# Patient Record
Sex: Male | Born: 1965 | Race: Black or African American | Hispanic: No | Marital: Married | Smoking: Never smoker
Health system: Southern US, Community
[De-identification: ages and names within clinical notes are randomized; demographics above are authoritative.]

## PROBLEM LIST (undated history)

## (undated) DIAGNOSIS — Z87898 Personal history of other specified conditions: Secondary | ICD-10-CM

## (undated) DIAGNOSIS — Z87442 Personal history of urinary calculi: Secondary | ICD-10-CM

## (undated) DIAGNOSIS — R338 Other retention of urine: Secondary | ICD-10-CM

## (undated) DIAGNOSIS — R569 Unspecified convulsions: Secondary | ICD-10-CM

## (undated) DIAGNOSIS — G40409 Other generalized epilepsy and epileptic syndromes, not intractable, without status epilepticus: Secondary | ICD-10-CM

## (undated) DIAGNOSIS — G4733 Obstructive sleep apnea (adult) (pediatric): Secondary | ICD-10-CM

## (undated) DIAGNOSIS — N201 Calculus of ureter: Secondary | ICD-10-CM

## (undated) DIAGNOSIS — N400 Enlarged prostate without lower urinary tract symptoms: Secondary | ICD-10-CM

## (undated) DIAGNOSIS — R310 Gross hematuria: Secondary | ICD-10-CM

## (undated) DIAGNOSIS — G43909 Migraine, unspecified, not intractable, without status migrainosus: Secondary | ICD-10-CM

---

## 1998-12-11 ENCOUNTER — Emergency Department (HOSPITAL_COMMUNITY): Admission: EM | Admit: 1998-12-11 | Discharge: 1998-12-11 | Payer: Self-pay | Admitting: Emergency Medicine

## 1999-08-16 ENCOUNTER — Encounter: Payer: Self-pay | Admitting: Emergency Medicine

## 1999-08-17 ENCOUNTER — Observation Stay (HOSPITAL_COMMUNITY): Admission: EM | Admit: 1999-08-17 | Discharge: 1999-08-17 | Payer: Self-pay | Admitting: Emergency Medicine

## 1999-08-17 ENCOUNTER — Encounter: Payer: Self-pay | Admitting: Emergency Medicine

## 2004-03-21 ENCOUNTER — Encounter: Admission: RE | Admit: 2004-03-21 | Discharge: 2004-03-21 | Payer: Self-pay | Admitting: Specialist

## 2004-03-24 ENCOUNTER — Encounter (INDEPENDENT_AMBULATORY_CARE_PROVIDER_SITE_OTHER): Payer: Self-pay | Admitting: *Deleted

## 2004-03-24 ENCOUNTER — Ambulatory Visit (HOSPITAL_COMMUNITY): Admission: RE | Admit: 2004-03-24 | Discharge: 2004-03-24 | Payer: Self-pay | Admitting: General Surgery

## 2004-03-24 ENCOUNTER — Ambulatory Visit (HOSPITAL_BASED_OUTPATIENT_CLINIC_OR_DEPARTMENT_OTHER): Admission: RE | Admit: 2004-03-24 | Discharge: 2004-03-24 | Payer: Self-pay | Admitting: General Surgery

## 2004-09-01 ENCOUNTER — Encounter
Admission: RE | Admit: 2004-09-01 | Discharge: 2004-09-01 | Payer: Self-pay | Admitting: Physical Medicine and Rehabilitation

## 2005-03-24 HISTORY — PX: OTHER SURGICAL HISTORY: SHX169

## 2006-08-08 ENCOUNTER — Encounter: Admission: RE | Admit: 2006-08-08 | Discharge: 2006-10-01 | Payer: Self-pay | Admitting: Family Medicine

## 2007-09-11 HISTORY — PX: SHOULDER SURGERY: SHX246

## 2009-08-05 ENCOUNTER — Emergency Department (HOSPITAL_COMMUNITY): Admission: EM | Admit: 2009-08-05 | Discharge: 2009-08-05 | Payer: Self-pay | Admitting: Emergency Medicine

## 2009-09-14 ENCOUNTER — Ambulatory Visit (HOSPITAL_BASED_OUTPATIENT_CLINIC_OR_DEPARTMENT_OTHER): Admission: RE | Admit: 2009-09-14 | Discharge: 2009-09-14 | Payer: Self-pay | Admitting: Neurology

## 2009-10-01 ENCOUNTER — Ambulatory Visit: Payer: Self-pay | Admitting: Internal Medicine

## 2009-11-01 ENCOUNTER — Encounter: Admission: RE | Admit: 2009-11-01 | Discharge: 2009-12-27 | Payer: Self-pay | Admitting: Family Medicine

## 2010-04-04 ENCOUNTER — Emergency Department (HOSPITAL_COMMUNITY): Admission: EM | Admit: 2010-04-04 | Discharge: 2010-04-04 | Payer: Self-pay | Admitting: Emergency Medicine

## 2010-11-08 ENCOUNTER — Emergency Department (HOSPITAL_COMMUNITY): Payer: Self-pay

## 2010-11-08 ENCOUNTER — Emergency Department (HOSPITAL_COMMUNITY)
Admission: EM | Admit: 2010-11-08 | Discharge: 2010-11-08 | Disposition: A | Discharge status: Home / Self Care | Payer: Self-pay | Attending: Emergency Medicine | Admitting: Emergency Medicine

## 2010-11-08 DIAGNOSIS — R5381 Other malaise: Secondary | ICD-10-CM | POA: Insufficient documentation

## 2010-11-08 DIAGNOSIS — R569 Unspecified convulsions: Secondary | ICD-10-CM | POA: Insufficient documentation

## 2010-11-08 DIAGNOSIS — F29 Unspecified psychosis not due to a substance or known physiological condition: Secondary | ICD-10-CM | POA: Insufficient documentation

## 2010-11-08 DIAGNOSIS — L905 Scar conditions and fibrosis of skin: Secondary | ICD-10-CM | POA: Insufficient documentation

## 2010-11-08 DIAGNOSIS — R32 Unspecified urinary incontinence: Secondary | ICD-10-CM | POA: Insufficient documentation

## 2010-11-08 LAB — DIFFERENTIAL
Basophils Absolute: 0 10*3/uL (ref 0.0–0.1)
Eosinophils Absolute: 0.1 10*3/uL (ref 0.0–0.7)
Lymphs Abs: 4.3 10*3/uL — ABNORMAL HIGH (ref 0.7–4.0)
Monocytes Relative: 6 % (ref 3–12)
Neutrophils Relative %: 37 % — ABNORMAL LOW (ref 43–77)

## 2010-11-08 LAB — URINALYSIS, ROUTINE W REFLEX MICROSCOPIC
Bilirubin Urine: NEGATIVE
Ketones, ur: NEGATIVE mg/dL
Protein, ur: NEGATIVE mg/dL
Urobilinogen, UA: 0.2 mg/dL (ref 0.0–1.0)

## 2010-11-08 LAB — CBC
HCT: 46.2 % (ref 39.0–52.0)
Hemoglobin: 15.3 g/dL (ref 13.0–17.0)
MCHC: 33.1 g/dL (ref 30.0–36.0)
Platelets: 232 10*3/uL (ref 150–400)
RDW: 14.9 % (ref 11.5–15.5)

## 2010-11-08 LAB — POCT I-STAT, CHEM 8
Chloride: 110 mEq/L (ref 96–112)
Potassium: 4.2 mEq/L (ref 3.5–5.1)
TCO2: 16 mmol/L (ref 0–100)

## 2010-11-08 LAB — POCT CARDIAC MARKERS
CKMB, poc: <1 ng/mL — ABNORMAL LOW (ref 1.0–8.0)
Myoglobin, poc: 340 ng/mL (ref 12–200)

## 2010-11-08 LAB — URINE MICROSCOPIC-ADD ON

## 2010-11-25 LAB — POCT I-STAT, CHEM 8
Calcium, Ion: 1.12 mmol/L (ref 1.12–1.32)
Chloride: 102 mEq/L (ref 96–112)
Creatinine, Ser: 1.1 mg/dL (ref 0.4–1.5)
Glucose, Bld: 109 mg/dL — ABNORMAL HIGH (ref 70–99)
HCT: 48 % (ref 39.0–52.0)
Hemoglobin: 16.3 g/dL (ref 13.0–17.0)
Potassium: 3.6 mEq/L (ref 3.5–5.1)

## 2010-11-25 LAB — DIFFERENTIAL
Basophils Absolute: 0 10*3/uL (ref 0.0–0.1)
Eosinophils Absolute: 0.2 10*3/uL (ref 0.0–0.7)
Lymphs Abs: 1.3 10*3/uL (ref 0.7–4.0)
Monocytes Relative: 8 % (ref 3–12)

## 2010-11-25 LAB — CBC
HCT: 42.8 % (ref 39.0–52.0)
Platelets: 273 10*3/uL (ref 150–400)
RBC: 5.38 MIL/uL (ref 4.22–5.81)

## 2010-12-13 LAB — URINALYSIS, ROUTINE W REFLEX MICROSCOPIC
Bilirubin Urine: NEGATIVE
Glucose, UA: NEGATIVE mg/dL
Ketones, ur: NEGATIVE mg/dL
Leukocytes, UA: NEGATIVE
Nitrite: NEGATIVE
Protein, ur: NEGATIVE mg/dL
Specific Gravity, Urine: 1.016 (ref 1.005–1.030)
Urobilinogen, UA: 0.2 mg/dL (ref 0.0–1.0)
pH: 6 (ref 5.0–8.0)

## 2010-12-13 LAB — COMPREHENSIVE METABOLIC PANEL
ALT: 24 U/L (ref 0–53)
BUN: 9 mg/dL (ref 6–23)
CO2: 28 mEq/L (ref 19–32)
Calcium: 8.4 mg/dL (ref 8.4–10.5)
GFR calc Af Amer: >60 mL/min (ref >60)
GFR calc non Af Amer: >60 mL/min (ref >60)
Glucose, Bld: 101 mg/dL — ABNORMAL HIGH (ref 70–99)
Potassium: 3.5 mEq/L (ref 3.5–5.1)

## 2010-12-13 LAB — RAPID URINE DRUG SCREEN, HOSP PERFORMED
Amphetamines: NOT DETECTED
Barbiturates: NOT DETECTED
Benzodiazepines: NOT DETECTED
Cocaine: NOT DETECTED
Opiates: NOT DETECTED
Tetrahydrocannabinol: NOT DETECTED

## 2010-12-13 LAB — DIFFERENTIAL
Basophils Absolute: 0 10*3/uL (ref 0.0–0.1)
Basophils Relative: 0 % (ref 0–1)
Eosinophils Absolute: 0.1 10*3/uL (ref 0.0–0.7)
Eosinophils Relative: 1 % (ref 0–5)
Lymphocytes Relative: 17 % (ref 12–46)
Lymphs Abs: 1.1 K/uL (ref 0.7–4.0)
Monocytes Absolute: 0.4 10*3/uL (ref 0.1–1.0)
Monocytes Relative: 6 % (ref 3–12)
Neutro Abs: 4.8 10*3/uL (ref 1.7–7.7)
Neutrophils Relative %: 76 % (ref 43–77)

## 2010-12-13 LAB — CBC
HCT: 44 % (ref 39.0–52.0)
Hemoglobin: 14.7 g/dL (ref 13.0–17.0)
MCHC: 33.4 g/dL (ref 30.0–36.0)
MCV: 80.4 fL (ref 78.0–100.0)
Platelets: 294 10*3/uL (ref 150–400)
RBC: 5.47 MIL/uL (ref 4.22–5.81)
RDW: 14.3 % (ref 11.5–15.5)
WBC: 6.3 10*3/uL (ref 4.0–10.5)

## 2010-12-13 LAB — URINE MICROSCOPIC-ADD ON

## 2010-12-13 LAB — COMPREHENSIVE METABOLIC PANEL WITH GFR
AST: 27 U/L (ref 0–37)
Albumin: 4 g/dL (ref 3.5–5.2)
Alkaline Phosphatase: 55 U/L (ref 39–117)
Chloride: 106 meq/L (ref 96–112)
Creatinine, Ser: 1.05 mg/dL (ref 0.4–1.5)
Sodium: 138 meq/L (ref 135–145)
Total Bilirubin: 0.5 mg/dL (ref 0.3–1.2)
Total Protein: 6.9 g/dL (ref 6.0–8.3)

## 2010-12-13 LAB — POCT CARDIAC MARKERS: CKMB, poc: 1.2 ng/mL (ref 1.0–8.0)

## 2011-01-26 NOTE — Op Note (Signed)
NAMESANDOR, ARBOLEDA                            ACCOUNT NO.:  [id number]   MEDICAL RECORD NO.:  [id number]                   PATIENT TYPE:  AMB   LOCATION:  [facility]SC                                  FACILITY:  [facility]   PHYSICIAN:  Leonie Man, M.D.                DATE OF BIRTH:  [birth year]   DATE OF PROCEDURE:  03/24/2004  DATE OF DISCHARGE:                                 OPERATIVE REPORT   [facility] RECORD NUMBER:  [id number].   PREOPERATIVE DIAGNOSIS:  Lipoma of face and scalp, right side.   POSTOPERATIVE DIAGNOSIS:  Lipoma of face and scalp, right side.   PROCEDURE:  Excision of lipoma of the scalp and face.   SURGEON:  Leonie Man, M.D.   ASSISTANT:  None.   ANESTHESIA:  Local using 1% lidocaine with epinephrine.   NOTE:  Mr. Shaff is a 45 year old man with an enlarging lipoma which is  located above his right ear, extending over to the lateral portion of his  right eye and upward into his scalp.  He comes to [place] for the excision of  this mass.  After the risks and potential benefits of surgery have been  fully discussed, all questions answered and consent obtained.   PROCEDURE:  The patient is positioned supinely, and the head and neck turned  to the left.  The mass is prepped and draped to be included in the sterile  operative field.  It measures 5 x 4 cm in size.  It is infiltrated with 1%  lidocaine with epinephrine.  A transverse incision is made over the mass,  deepening the incision to the capsule.  The capsule is entered.  The mass is  freed from the capsule bluntly and then dissected free, using both sharp and  blunt dissection until the entire mass is removed and forwarded for  pathologic evaluation.  Hemostasis and closure of the subcutaneous tissue  was carried out with a running 3-0 Vicryl suture.  The skin was closed with  a running 4-0 Monocryl suture and then reinforced with Steri-Strips, and a  sterile compressive dressing applied.  The patient  was then removed from the  operating room to the recovery room in stable condition.  He tolerated the  procedure well.                                               Leonie Man, M.D.    PB/MEDQ  D:  03/24/2004  T:  03/24/2004  Job:  [id number]

## 2011-03-02 ENCOUNTER — Inpatient Hospital Stay (INDEPENDENT_AMBULATORY_CARE_PROVIDER_SITE_OTHER)
Admission: RE | Admit: 2011-03-02 | Discharge: 2011-03-02 | Disposition: A | Discharge status: Home / Self Care | Payer: Self-pay | Source: Ambulatory Visit | Attending: Emergency Medicine | Admitting: Emergency Medicine

## 2011-03-02 DIAGNOSIS — M25519 Pain in unspecified shoulder: Secondary | ICD-10-CM

## 2011-04-25 ENCOUNTER — Emergency Department (HOSPITAL_BASED_OUTPATIENT_CLINIC_OR_DEPARTMENT_OTHER)
Admission: EM | Admit: 2011-04-25 | Discharge: 2011-04-25 | Disposition: A | Discharge status: Home / Self Care | Payer: Medicaid Other | Attending: Emergency Medicine | Admitting: Emergency Medicine

## 2011-04-25 ENCOUNTER — Encounter: Payer: Self-pay | Admitting: *Deleted

## 2011-04-25 DIAGNOSIS — Z91199 Patient's noncompliance with other medical treatment and regimen due to unspecified reason: Secondary | ICD-10-CM | POA: Insufficient documentation

## 2011-04-25 DIAGNOSIS — Z9119 Patient's noncompliance with other medical treatment and regimen: Secondary | ICD-10-CM | POA: Insufficient documentation

## 2011-04-25 DIAGNOSIS — R569 Unspecified convulsions: Secondary | ICD-10-CM | POA: Insufficient documentation

## 2011-04-25 DIAGNOSIS — Z9114 Patient's other noncompliance with medication regimen: Secondary | ICD-10-CM

## 2011-04-25 MED ORDER — LORAZEPAM 2 MG/ML IJ SOLN
INTRAMUSCULAR | Status: AC
  Filled 2011-04-25: qty 1

## 2011-04-25 NOTE — ED Notes (Signed)
Pt brought in by EMS 'for  Seizure activity . Seizures x 2 today, 20g right ac,

## 2011-04-25 NOTE — ED Provider Notes (Signed)
History     CSN: [id number] Arrival date & time: 04/25/2011  2:35 AM  Chief Complaint  Patient presents with  . Seizures   HPI  Past Medical History  Diagnosis Date  . Seizure     History reviewed. No pertinent past surgical history.  History reviewed. No pertinent family history.  History  Substance Use Topics  . Smoking status: Never Smoker   . Smokeless tobacco: Not on file  . Alcohol Use: No      Review of Systems  Physical Exam  There were no vitals taken for this visit.  Physical Exam  ED Course  Procedures  MDM See downtime form for H&P.      Hanley Seamen, MD 04/25/11 [phone]

## 2011-04-25 NOTE — ED Notes (Signed)
Pt discharged home.  Chart on paper during downrtime

## 2011-12-19 ENCOUNTER — Encounter (HOSPITAL_COMMUNITY): Payer: Self-pay | Admitting: *Deleted

## 2011-12-19 ENCOUNTER — Emergency Department (HOSPITAL_COMMUNITY)
Admission: EM | Admit: 2011-12-19 | Discharge: 2011-12-19 | Disposition: A | Discharge status: Home / Self Care | Payer: Medicaid Other | Attending: Emergency Medicine | Admitting: Emergency Medicine

## 2011-12-19 DIAGNOSIS — I10 Essential (primary) hypertension: Secondary | ICD-10-CM | POA: Insufficient documentation

## 2011-12-19 DIAGNOSIS — R319 Hematuria, unspecified: Secondary | ICD-10-CM | POA: Insufficient documentation

## 2011-12-19 DIAGNOSIS — R350 Frequency of micturition: Secondary | ICD-10-CM | POA: Insufficient documentation

## 2011-12-19 DIAGNOSIS — R3915 Urgency of urination: Secondary | ICD-10-CM | POA: Insufficient documentation

## 2011-12-19 DIAGNOSIS — R42 Dizziness and giddiness: Secondary | ICD-10-CM | POA: Insufficient documentation

## 2011-12-19 DIAGNOSIS — R300 Dysuria: Secondary | ICD-10-CM | POA: Insufficient documentation

## 2011-12-19 LAB — CBC
HCT: 44.8 % (ref 39.0–52.0)
MCHC: 32.6 g/dL (ref 30.0–36.0)
MCV: 79 fL (ref 78.0–100.0)
Platelets: 250 10*3/uL (ref 150–400)
RDW: 14 % (ref 11.5–15.5)
WBC: 4.6 10*3/uL (ref 4.0–10.5)

## 2011-12-19 LAB — URINALYSIS, ROUTINE W REFLEX MICROSCOPIC
Ketones, ur: NEGATIVE mg/dL
Leukocytes, UA: NEGATIVE
Nitrite: NEGATIVE
Protein, ur: 100 mg/dL — AB
Urobilinogen, UA: 0.2 mg/dL (ref 0.0–1.0)

## 2011-12-19 LAB — DIFFERENTIAL
Basophils Absolute: 0 10*3/uL (ref 0.0–0.1)
Basophils Relative: 0 % (ref 0–1)
Eosinophils Relative: 3 % (ref 0–5)
Lymphocytes Relative: 53 % — ABNORMAL HIGH (ref 12–46)
Monocytes Absolute: 0.2 10*3/uL (ref 0.1–1.0)
Neutro Abs: 1.8 10*3/uL (ref 1.7–7.7)

## 2011-12-19 LAB — BASIC METABOLIC PANEL
Calcium: 9.1 mg/dL (ref 8.4–10.5)
Creatinine, Ser: 0.76 mg/dL (ref 0.50–1.35)
GFR calc Af Amer: >90 mL/min (ref >90)
Sodium: 136 mEq/L (ref 135–145)

## 2011-12-19 LAB — URINE MICROSCOPIC-ADD ON

## 2011-12-19 NOTE — ED Provider Notes (Signed)
History     CSN: [id number]  Arrival date & time 12/19/11  4098   First MD Initiated Contact with Patient 12/19/11 1038      Chief Complaint  Patient presents with  . Hematuria  . Dizziness    (Consider location/radiation/quality/duration/timing/severity/associated sxs/prior treatment) HPI Comments: Patient presents for evaluation of hematuria which started 2 days ago.  He also describes pain at the end of his urine stream, frequent urination of small amounts of urine and urgency.  He denies penile discharge.  He was seen by his PCP yesterday and was placed on Cipro for a urinary infection and has had one dose so far, but presents here do to increased blood in his urine.  He denies flank pain, fevers chills nausea or vomiting.  No prior history of kidney stones.  He had an episode of feeling lightheaded earlier today which has resolved.  Patient is a 46 y.o. male presenting with hematuria. The history is provided by the patient.  Hematuria This is a new problem. The current episode started in the past 7 days. The problem has been gradually worsening since onset. He describes the hematuria as gross hematuria. The hematuria occurs throughout @his @ entire urinary stream.  He reports clotting at the beginning of his urine stream. His pain is at a severity of 2/10. He describes his urine color as light pink. Irritative symptoms include frequency and urgency. Associated symptoms include dysuria. Pertinent negatives include no abdominal pain, chills, fever, genital pain, hesitancy, inability to urinate, nausea or vomiting. He is not sexually active.    Past Medical History  Diagnosis Date  . Seizure     Past Surgical History  Procedure Date  . Shoulder surgery     left    No family history on file.  History  Substance Use Topics  . Smoking status: Never Smoker   . Smokeless tobacco: Not on file  . Alcohol Use: No      Review of Systems  Constitutional: Negative for fever and  chills.  HENT: Negative for congestion, sore throat and neck pain.   Eyes: Negative.   Respiratory: Negative for chest tightness and shortness of breath.   Cardiovascular: Negative for chest pain.  Gastrointestinal: Negative for nausea, vomiting and abdominal pain.  Genitourinary: Positive for dysuria, urgency, frequency and hematuria. Negative for hesitancy, penile swelling, scrotal swelling and testicular pain.  Musculoskeletal: Negative for joint swelling and arthralgias.  Skin: Negative.  Negative for rash and wound.  Neurological: Negative for dizziness, weakness, light-headedness, numbness and headaches.  Hematological: Negative.   Psychiatric/Behavioral: Negative.     Allergies  Review of patient's allergies indicates no known allergies.  Home Medications   Current Outpatient Rx  Name Route Sig Dispense Refill  . CIPROFLOXACIN HCL 500 MG PO TABS Oral Take 500 mg by mouth 2 (two) times daily. For 10 days    . GLUCOSAMINE-CHONDROITIN 500-400 MG PO TABS Oral Take 1 tablet by mouth 2 (two) times daily.    Marland Kitchen LEVETIRACETAM 500 MG PO TABS Oral Take 500 mg by mouth every 12 (twelve) hours.    Marland Kitchen NAPROXEN 500 MG PO TABS Oral Take 500 mg by mouth 2 (two) times daily as needed.      BP 162/96  Pulse 54  Temp(Src) 97.8 F (36.6 C) (Oral)  Resp 16  Wt 175 lb (79.379 kg)  SpO2 100%  Physical Exam  Nursing note and vitals reviewed. Constitutional: He is oriented to person, place, and time. He appears well-developed and  well-nourished.  HENT:  Head: Normocephalic and atraumatic.  Eyes: Conjunctivae are normal.  Neck: Normal range of motion.  Cardiovascular: Normal rate, regular rhythm, normal heart sounds and intact distal pulses.   Pulmonary/Chest: Effort normal and breath sounds normal. He has no wheezes.  Abdominal: Soft. Bowel sounds are normal. There is no tenderness.  Musculoskeletal: Normal range of motion.  Neurological: He is alert and oriented to person, place, and time.   Skin: Skin is warm and dry.  Psychiatric: He has a normal mood and affect.    ED Course  Procedures (including critical care time)  Labs Reviewed  CBC - Abnormal; Notable for the following:    MCH 25.7 (*)    All other components within normal limits  DIFFERENTIAL - Abnormal; Notable for the following:    Neutrophils Relative 39 (*)    Lymphocytes Relative 53 (*)    All other components within normal limits  BASIC METABOLIC PANEL - Abnormal; Notable for the following:    Glucose, Bld 68 (*)    All other components within normal limits  URINALYSIS, ROUTINE W REFLEX MICROSCOPIC - Abnormal; Notable for the following:    APPearance CLOUDY (*)    Hgb urine dipstick LARGE (*)    Protein, ur 100 (*)    All other components within normal limits  URINE MICROSCOPIC-ADD ON - Abnormal; Notable for the following:    Bacteria, UA FEW (*)    All other components within normal limits   No results found.   1. Hematuria   2. Hypertension       MDM  Patient with incompletely treated UTI suspected, having had only one ciprofloxacin tablet dose since prescribed yesterday.  Patient encouraged to continue with current treatment, increase fluids and get rechecked by his doctor next week as planned.  Also discussed elevated blood pressure which he should have rechecked again next week.        Candis Musa, PA 12/19/11 1327

## 2011-12-19 NOTE — ED Notes (Signed)
Pt alert and oriented x4. Respirations even and unlabored, bilateral symmetrical rise and fall of chest. Skin warm and dry. In no acute distress. Denies needs.  Pt verbalized understanding discharge instructions and to continue taking abx already prescribed.

## 2011-12-19 NOTE — ED Notes (Signed)
Pt states "I noticed blood in my pee Monday @ 7:30, went to the doctor & was given abx, sometimes I have pain in my penis when I pee, now I feel dizzy"

## 2011-12-19 NOTE — Discharge Instructions (Signed)
Hematuria, Adult Hematuria (blood in your urine) can be caused by a bladder infection (cystitis), kidney infection (pyelonephritis), prostate infection (prostatitis), or kidney stone. Infections will usually respond to antibiotics (medications which kill germs), and a kidney stone will usually pass through your urine without further treatment. If you were put on antibiotics, take all the medicine until gone. You may feel better in a few days, but take all of your medicine or the infection may not respond and become more difficult to treat. If antibiotics were not given, an infection did not cause the blood in the urine. A further work up to find out the reason may be needed. HOME CARE INSTRUCTIONS   Drink lots of fluid, 3 to 4 quarts a day. If you have been diagnosed with an infection, cranberry juice is especially recommended, in addition to large amounts of water.   Avoid caffeine, tea, and carbonated beverages, because they tend to irritate the bladder.   Avoid alcohol as it may irritate the prostate.   Only take over-the-counter or prescription medicines for pain, discomfort, or fever as directed by your caregiver.   If you have been diagnosed with a kidney stone follow your caregivers instructions regarding straining your urine to catch the stone.  TO PREVENT FURTHER INFECTIONS:  Empty the bladder often. Avoid holding urine for long periods of time.   After a bowel movement, women should cleanse front to back. Use each tissue only once.   Empty the bladder before and after sexual intercourse if you are a male.   Return to your caregiver if you develop back pain, fever, nausea (feeling sick to your stomach), vomiting, or your symptoms (problems) are not better in 3 days. Return sooner if you are getting worse.  If you have been requested to return for further testing make sure to keep your appointments. If an infection is not the cause of blood in your urine, X-rays may be required. Your  caregiver will discuss this with you. SEEK IMMEDIATE MEDICAL CARE IF:   You have a persistent fever over 102 F (38.9 C).   You develop severe vomiting and are unable to keep the medication down.   You develop severe back or abdominal pain despite taking your medications.   You begin passing a large amount of blood or clots in your urine.   You feel extremely weak or faint, or pass out.  MAKE SURE YOU:   Understand these instructions.   Will watch your condition.   Will get help right away if you are not doing well or get worse.  Document Released: 08/27/2005 Document Revised: 08/16/2011 Document Reviewed: 04/15/2008 [facility] Patient Information 2012 [place], [place].  The blood you are seen in your urine along with the symptoms you're having are most consistent with a urinary tract infection.  Please continue with the ciprofloxacin antibiotic that you're prescribed by your doctor yesterday.  It'll be important that you followup with him for recheck in one antibiotic is completed to make sure the infection and the blood has resolved.  Additionally, you were blood pressure is elevated today and he should have this rechecked when you see your doctor next week.  Your lab tests are otherwise stable today, there does not appear to be any kidney problems today.  If for blood in your urine does not completely resolve he may need to see a specialist called the urologist.  You can discuss this with Dr. Concepcion Elk once your antibiotic is completed.

## 2011-12-19 NOTE — ED Provider Notes (Signed)
Medical screening examination/treatment/procedure(s) were performed by non-physician practitioner and as supervising physician I was immediately available for consultation/collaboration.   Khylei Wilms, MD 12/19/11 1544 

## 2011-12-21 ENCOUNTER — Emergency Department (HOSPITAL_COMMUNITY)
Admission: EM | Admit: 2011-12-21 | Discharge: 2011-12-22 | Disposition: A | Discharge status: Home / Self Care | Payer: Medicaid Other | Attending: Emergency Medicine | Admitting: Emergency Medicine

## 2011-12-21 ENCOUNTER — Encounter (HOSPITAL_COMMUNITY): Payer: Self-pay | Admitting: *Deleted

## 2011-12-21 DIAGNOSIS — R319 Hematuria, unspecified: Secondary | ICD-10-CM | POA: Insufficient documentation

## 2011-12-21 DIAGNOSIS — R339 Retention of urine, unspecified: Secondary | ICD-10-CM | POA: Insufficient documentation

## 2011-12-21 LAB — URINALYSIS, ROUTINE W REFLEX MICROSCOPIC
Bilirubin Urine: NEGATIVE
Glucose, UA: NEGATIVE mg/dL
pH: 7.5 (ref 5.0–8.0)

## 2011-12-21 MED ORDER — LIDOCAINE HCL 2 % EX GEL
CUTANEOUS | Status: AC
  Filled 2011-12-21: qty 10

## 2011-12-21 NOTE — ED Provider Notes (Cosign Needed Addendum)
History     CSN: [id number]  Arrival date & time 12/21/11  1927   First MD Initiated Contact with Patient 12/21/11 2203      Chief Complaint  Patient presents with  . Hematuria  . Urinary Retention    (Consider location/radiation/quality/duration/timing/severity/associated sxs/prior treatment) Patient is a 46 y.o. male presenting with hematuria. The history is provided by the patient.  Hematuria Pertinent negatives include no abdominal pain, chills, fever or flank pain.  pt c/o hematuria for past 5 days. Was seen in ed a couple days ago, dx w uti. Is on cipro. States today felt trouble voiding, unable to empty bladder completely, urinary urgency, and suprapubic pain. Constant, dull, nonradiating. No fever or chills. Denies hx same symptoms. No other abn bleeding or bruising. No scrotal or testicular pain. No back or flank pain. No numbness/weakness.   Past Medical History  Diagnosis Date  . Seizure     Past Surgical History  Procedure Date  . Shoulder surgery     left    History reviewed. No pertinent family history.  History  Substance Use Topics  . Smoking status: Never Smoker   . Smokeless tobacco: Not on file  . Alcohol Use: No      Review of Systems  Constitutional: Negative for fever and chills.  HENT: Negative for neck pain.   Respiratory: Negative for shortness of breath.   Cardiovascular: Negative for chest pain and leg swelling.  Gastrointestinal: Negative for abdominal pain.  Genitourinary: Positive for hematuria. Negative for flank pain.  Musculoskeletal: Negative for back pain.  Skin: Negative for rash.  Neurological: Negative for headaches.  Hematological: Does not bruise/bleed easily.  Psychiatric/Behavioral: Negative for confusion.    Allergies  Review of patient's allergies indicates no known allergies.  Home Medications   Current Outpatient Rx  Name Route Sig Dispense Refill  . CIPROFLOXACIN HCL 500 MG PO TABS Oral Take 500 mg by mouth  2 (two) times daily. For 10 days    . DUTASTERIDE-TAMSULOSIN HCL 0.5-0.4 MG PO CAPS Oral Take 1 tablet by mouth daily.    Marland Kitchen GLUCOSAMINE-CHONDROITIN 500-400 MG PO TABS Oral Take 1 tablet by mouth 2 (two) times daily.    Marland Kitchen LEVETIRACETAM 500 MG PO TABS Oral Take 500 mg by mouth every 12 (twelve) hours.    Marland Kitchen NAPROXEN 500 MG PO TABS Oral Take 500 mg by mouth 2 (two) times daily as needed.      BP 167/104  Pulse 112  Temp(Src) 98 F (36.7 C) (Oral)  Resp 24  SpO2 100%  Physical Exam  Nursing note and vitals reviewed. Constitutional: He is oriented to person, place, and time. He appears well-developed and well-nourished. No distress.  HENT:  Head: Atraumatic.  Eyes: Pupils are equal, round, and reactive to light.  Neck: Neck supple. No tracheal deviation present.  Cardiovascular: Normal rate.   Pulmonary/Chest: Effort normal. No accessory muscle usage. No respiratory distress.  Abdominal: Soft. He exhibits no distension and no mass. There is no tenderness. There is no rebound and no guarding.  Genitourinary:       No cva  Tenderness. No scrotal or testicular tenderness or swelling.   Musculoskeletal: Normal range of motion. He exhibits no edema and no tenderness.  Neurological: He is alert and oriented to person, place, and time.  Skin: Skin is warm and dry.  Psychiatric: He has a normal mood and affect.    ED Course  Procedures (including critical care time)  Labs Reviewed  URINALYSIS,  ROUTINE W REFLEX MICROSCOPIC - Abnormal; Notable for the following:    Color, Urine RED (*) BIOCHEMICALS MAY BE AFFECTED BY COLOR   APPearance TURBID (*)    Hgb urine dipstick LARGE (*)    Ketones, ur TRACE (*)    Protein, ur 100 (*)    Leukocytes, UA SMALL (*)    All other components within normal limits  URINE MICROSCOPIC-ADD ON   Results for orders placed during the hospital encounter of 12/21/11  URINALYSIS, ROUTINE W REFLEX MICROSCOPIC      Component Value Range   Color, Urine RED (*)  YELLOW    APPearance TURBID (*) CLEAR    Specific Gravity, Urine 1.029  1.005 - 1.030    pH 7.5  5.0 - 8.0    Glucose, UA NEGATIVE  NEGATIVE (mg/dL)   Hgb urine dipstick LARGE (*) NEGATIVE    Bilirubin Urine NEGATIVE  NEGATIVE    Ketones, ur TRACE (*) NEGATIVE (mg/dL)   Protein, ur 191 (*) NEGATIVE (mg/dL)   Urobilinogen, UA 0.2  0.0 - 1.0 (mg/dL)   Nitrite NEGATIVE  NEGATIVE    Leukocytes, UA SMALL (*) NEGATIVE   URINE MICROSCOPIC-ADD ON      Component Value Range   RBC / HPF TOO NUMEROUS TO COUNT  <3 (RBC/hpf)   Urine-Other FIELD OBSCURED BY RBC'S         MDM  Foley. pvr 800 cc.   Large foley placed, irrigated bladder until clots clear.  Recheck no suprapubic pain, foley draining red tinged urine. abd soft nt. Recent renal fxn and cbc normal.   Discussed importance close urology follow up.   Pt denies any coumadin or other anticoag use. No ongoing abd or flank pain. No fever or chills.   Pt had requested pain med re pain from catheter. Hydrocodone po - note pt has ride, does not have to drive.       Suzi Roots, MD 12/22/11 4782  Suzi Roots, MD 12/22/11 [phone]

## 2011-12-21 NOTE — ED Notes (Signed)
Pt in c/o urinary retention since this afternoon, pt with hematuria x5 days, pt c/o abd pain at this time.

## 2011-12-21 NOTE — ED Notes (Signed)
Pt states that he was seen recently for the same issue, states that he still has problem with urination, reports pain, difficulty and blood in urine, also states secondary to the sx developed abd pain

## 2011-12-22 ENCOUNTER — Encounter (HOSPITAL_COMMUNITY): Payer: Self-pay

## 2011-12-22 ENCOUNTER — Inpatient Hospital Stay (HOSPITAL_COMMUNITY)
Admission: EM | Admit: 2011-12-22 | Discharge: 2011-12-23 | DRG: 696 | Disposition: A | Discharge status: Home / Self Care | Payer: Medicaid Other | Attending: Urology | Admitting: Urology

## 2011-12-22 ENCOUNTER — Observation Stay (HOSPITAL_COMMUNITY): Payer: Medicaid Other

## 2011-12-22 DIAGNOSIS — R338 Other retention of urine: Secondary | ICD-10-CM

## 2011-12-22 DIAGNOSIS — Z86718 Personal history of other venous thrombosis and embolism: Secondary | ICD-10-CM

## 2011-12-22 DIAGNOSIS — N201 Calculus of ureter: Secondary | ICD-10-CM

## 2011-12-22 DIAGNOSIS — R339 Retention of urine, unspecified: Secondary | ICD-10-CM | POA: Diagnosis present

## 2011-12-22 DIAGNOSIS — Z79899 Other long term (current) drug therapy: Secondary | ICD-10-CM

## 2011-12-22 DIAGNOSIS — N401 Enlarged prostate with lower urinary tract symptoms: Secondary | ICD-10-CM | POA: Diagnosis present

## 2011-12-22 DIAGNOSIS — N138 Other obstructive and reflux uropathy: Secondary | ICD-10-CM | POA: Diagnosis present

## 2011-12-22 DIAGNOSIS — R310 Gross hematuria: Principal | ICD-10-CM | POA: Diagnosis present

## 2011-12-22 DIAGNOSIS — N200 Calculus of kidney: Secondary | ICD-10-CM

## 2011-12-22 DIAGNOSIS — N3289 Other specified disorders of bladder: Secondary | ICD-10-CM | POA: Diagnosis present

## 2011-12-22 HISTORY — DX: Gross hematuria: R31.0

## 2011-12-22 HISTORY — DX: Other retention of urine: R33.8

## 2011-12-22 HISTORY — DX: Calculus of ureter: N20.1

## 2011-12-22 MED ORDER — TAMSULOSIN HCL 0.4 MG PO CAPS
0.4000 mg | ORAL_CAPSULE | Freq: Once | ORAL | Status: AC
  Administered 2011-12-22: 0.4 mg via ORAL
  Filled 2011-12-22: qty 1

## 2011-12-22 MED ORDER — ONDANSETRON HCL 4 MG/2ML IJ SOLN
4.0000 mg | INTRAMUSCULAR | Status: DC | PRN
Start: 2011-12-22 — End: 2011-12-23

## 2011-12-22 MED ORDER — HYDROCODONE-ACETAMINOPHEN 5-325 MG PO TABS: 2.0000 | ORAL_TABLET | ORAL | Status: AC | PRN

## 2011-12-22 MED ORDER — HYDROCODONE-ACETAMINOPHEN 5-325 MG PO TABS
1.0000 | ORAL_TABLET | Freq: Once | ORAL | Status: AC
  Administered 2011-12-22: 1 via ORAL
  Filled 2011-12-22: qty 1

## 2011-12-22 MED ORDER — LEVETIRACETAM 500 MG PO TABS
500.0000 mg | ORAL_TABLET | Freq: Two times a day (BID) | ORAL | Status: DC
  Administered 2011-12-23: 500 mg via ORAL
  Filled 2011-12-22 (×3): qty 1

## 2011-12-22 MED ORDER — TAMSULOSIN HCL 0.4 MG PO CAPS
0.4000 mg | ORAL_CAPSULE | Freq: Every day | ORAL | Status: DC
  Filled 2011-12-22: qty 1

## 2011-12-22 MED ORDER — TAMSULOSIN HCL 0.4 MG PO CAPS: 0.4000 mg | ORAL_CAPSULE | Freq: Every day | ORAL | Status: DC

## 2011-12-22 MED ORDER — BELLADONNA ALKALOIDS-OPIUM 16.2-60 MG RE SUPP
1.0000 | Freq: Four times a day (QID) | RECTAL | Status: DC | PRN
  Administered 2011-12-22 – 2011-12-23 (×2): 1 via RECTAL
  Filled 2011-12-22 (×3): qty 1

## 2011-12-22 MED ORDER — HYDROCODONE-ACETAMINOPHEN 5-325 MG PO TABS: 1.0000 | ORAL_TABLET | ORAL | Status: DC | PRN

## 2011-12-22 MED ORDER — SODIUM CHLORIDE 0.9 % IV BOLUS (SEPSIS)
1000.0000 mL | Freq: Once | INTRAVENOUS | Status: AC
  Administered 2011-12-22: 1000 mL via INTRAVENOUS

## 2011-12-22 MED ORDER — HYDROCODONE-ACETAMINOPHEN 5-325 MG PO TABS
2.0000 | ORAL_TABLET | Freq: Once | ORAL | Status: AC
  Administered 2011-12-22: 2 via ORAL
  Filled 2011-12-22: qty 2

## 2011-12-22 MED ORDER — CIPROFLOXACIN HCL 500 MG PO TABS
500.0000 mg | ORAL_TABLET | Freq: Two times a day (BID) | ORAL | Status: DC
  Administered 2011-12-23: 500 mg via ORAL
  Filled 2011-12-22 (×3): qty 1

## 2011-12-22 MED ORDER — KCL IN DEXTROSE-NACL 20-5-0.45 MEQ/L-%-% IV SOLN
INTRAVENOUS | Status: DC
  Administered 2011-12-23: via INTRAVENOUS
  Filled 2011-12-22 (×3): qty 1000

## 2011-12-22 MED ORDER — HYDROMORPHONE HCL PF 1 MG/ML IJ SOLN
0.5000 mg | INTRAMUSCULAR | Status: DC | PRN
  Filled 2011-12-22: qty 1

## 2011-12-22 NOTE — ED Notes (Signed)
Dr. Hyacinth Meeker informed of drop in pt BP with a change of position. 1L bolus ordered.

## 2011-12-22 NOTE — ED Provider Notes (Addendum)
History     CSN: [id number]  Arrival date & time 12/22/11  1424   First MD Initiated Contact with Patient 12/22/11 1521      Chief Complaint  Patient presents with  . Urinary Retention    Here yesterday for urninary retention. Was sent home with catheter and leg bag.     (Consider location/radiation/quality/duration/timing/severity/associated sxs/prior treatment) HPI  46yoM pw urinary retention. Patient seen in ED multiple times this week. Initially diagnosed with UTI and started to cipro. Returned with urinary retention 2/2 hematuria and foley catheter last night. States he has been flushing the catheter himself but ran out of flushes. Continues to have hematuria. Dec urine output. Ran out of flushes at home.  C/O lower abdominal pressure. Denies nausea, vomiting. Denies constipation or diarrhea. Denies fever/chills.     Tyson Dense, RN 12/22/2011 15:00    Foley cath stopped up. Went to [facility] to get sterile water for irrigation and was told they have to have an Rx.         Danella Maiers, RN 12/22/2011 04:19      Patient is alert and oriented x3. He was given DC instructions and follow up visit instructions. Patient gave verbal understanding. He was DC ambulatory via wheelchair to home. V/S stable. He was not showing any signs of distress on DC         [facility] Delories Heinz, RN 12/22/2011 03:10      Dr. Hyacinth Meeker informed of drop in pt BP with a change of position. 1L bolus ordered.         Fanny Dance, RN 12/22/2011 02:58      PT educated on cathter irrigation and change of bags. Requesting apple juice, apple juice given.         Fanny Dance, RN 12/22/2011 02:25      Pt requesting to speak with urologist due to fear of going home with catheter and blood clots. RN explained the follow up information, informed Charge RN Fleet Contras and Dr. Hyacinth Meeker. Dr. Hyacinth Meeker reassured pt as well as RN. Pt verbalized understanding and agrees to plan.      Past Medical History    Diagnosis Date  . Seizure     Past Surgical History  Procedure Date  . Shoulder surgery     left    History reviewed. No pertinent family history.  History  Substance Use Topics  . Smoking status: Never Smoker   . Smokeless tobacco: Not on file  . Alcohol Use: No    Review of Systems  All other systems reviewed and are negative.  except as noted HPI   Allergies  Review of patient's allergies indicates no known allergies.  Home Medications   Current Outpatient Rx  Name Route Sig Dispense Refill  . CIPROFLOXACIN HCL 500 MG PO TABS Oral Take 500 mg by mouth 2 (two) times daily. For 10 days    . DUTASTERIDE-TAMSULOSIN HCL 0.5-0.4 MG PO CAPS Oral Take 1 tablet by mouth daily.    Marland Kitchen GLUCOSAMINE-CHONDROITIN 500-400 MG PO TABS Oral Take 1 tablet by mouth 2 (two) times daily.    Marland Kitchen LEVETIRACETAM 500 MG PO TABS Oral Take 500 mg by mouth every 12 (twelve) hours.    Marland Kitchen NAPROXEN 500 MG PO TABS Oral Take 500 mg by mouth 2 (two) times daily as needed.    Marland Kitchen HYDROCODONE-ACETAMINOPHEN 5-325 MG PO TABS Oral Take 2 tablets by mouth every 4 (four) hours as needed for pain. 6  tablet 0  . TAMSULOSIN HCL 0.4 MG PO CAPS Oral Take 1 capsule (0.4 mg total) by mouth daily. 10 capsule 0    BP 146/95  Pulse 87  Temp 98.3 F (36.8 C)  Resp 16  SpO2 99%  Physical Exam  Nursing note and vitals reviewed. Constitutional: He is oriented to person, place, and time. He appears well-developed and well-nourished. No distress.  HENT:  Head: Atraumatic.  Mouth/Throat: Oropharynx is clear and moist.       Nasal congestion  Eyes: Conjunctivae are normal. Pupils are equal, round, and reactive to light.  Neck: Neck supple.  Cardiovascular: Normal rate, regular rhythm, normal heart sounds and intact distal pulses.  Exam reveals no gallop and no friction rub.   No murmur heard. Pulmonary/Chest: Effort normal. No respiratory distress. He has no wheezes. He has no rales.  Abdominal: Soft. Bowel sounds are  normal. There is tenderness. There is no rebound and no guarding.       Leg bag full of dark yellow urine Flushes per nursing staff Min suprapubic ttp  Genitourinary:       Brown stool Prostate +ttp  Musculoskeletal: Normal range of motion. He exhibits no edema and no tenderness.  Neurological: He is alert and oriented to person, place, and time.  Skin: Skin is warm and dry. No rash noted. No erythema.  Psychiatric: He has a normal mood and affect.    ED Course  Procedures (including critical care time)  Labs Reviewed - No data to display No results found.   1. Hematuria   2. Urinary retention   3. Renal stone     MDM  Likely prostatitis with urinary retention originally. He will continue cipro. Flomax prescribed. No gross hematuria in ED and foley irrigates well. Will discharge home with urology f/u. Will need recheck in Cr istat 1.1 today, which is increased from previous.    Pt with significant hematuria after discharge. Not clearing with 3 way foley and leaking around foley. D/W Dr. Isabel Caprice who has seen pt in the ED and also states he saw as outpatient yesterday-- CT A/P outpatient with 6mm renal stone. Will admit patient for possible lithotripsy tomorrow.      Forbes Cellar, MD 12/22/11 1754  Forbes Cellar, MD 12/22/11 [phone]

## 2011-12-22 NOTE — ED Notes (Signed)
Attempted to Call report to [facility] [address].  Rn unable to take report.  To call back

## 2011-12-22 NOTE — ED Notes (Signed)
Foley cath stopped up. Went to [facility] to get sterile water for irrigation and was told they have to have an Rx.

## 2011-12-22 NOTE — ED Notes (Signed)
PT educated on cathter irrigation and change of bags. Requesting apple juice, apple juice given.

## 2011-12-22 NOTE — H&P (Signed)
Reason For Visit  Gross hematuria and nonspecific complaints. Francis Jackson was seen in our office yesterday with the history below. He essentially been back to the emergency room on a couple of occasions with gross hematuria and urinary retention. I was contacted this evening to see him. He currently has an indwelling three-way Foley and is having continuous irrigation. His urine is now light pink in color. He presumptively still has his distal ureteral calculus. He continues to have quite a bit of penile discomfort and bladder spasms with the catheter.  History of Present Illness  Francis Jackson is referred today with approximately a 5 day history of gross hematuria. The patient initially had decided onset of total gross hematuria. This was associated with some obstructive voiding symptoms and questionably some mild dysuria. He was seen in the emergency room but has not had any imaging studies. He was thought to potentially have acute cystitis/prostatitis but I was unable to locate any culture data. He does continue to complain of intermittent but persistent gross hematuria. He is also more recently complained of body aches and a headache especially the last 12-24 hours. Patient's  recent hemoglobin was 14.7. White blood cell count was normal. Overall systemic renal function was well within normal limits.   Past Medical History Problems  1. 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 History of  Hernia Repair 2. History of  Shoulder Surgery  Current Meds 1. Cipro 500 MG Oral Tablet; Therapy: (Recorded:12Apr2013) to 2. Glucosamine Chondroitin Complx 500-400 MG TABS; Therapy: (Recorded:12Apr2013) to 3. Keppra 500 MG Oral Tablet; Therapy: (Recorded:12Apr2013) to 4. Naproxen 500 MG Oral Tablet; Therapy: (Recorded:12Apr2013) to  Allergies Medication  1. No Known Drug Allergies  Family History Problems  1. Paternal history of  Death In The Family  Father 2. Maternal history of  Death In The Family Mother 3. Family history of  Family Health Status Number Of Children 2 sons and 3 daughters  Social History Problems    Marital History - Currently Married   Never A Smoker   Occupation: unemployed Denied    History of  Alcohol Use   History of  Caffeine Use   History of  Tobacco Use  Vitals Vital Signs [Data Includes: Last 1 Day]  12Apr2013 12:51PM  BMI Calculated: 28.04 BSA Calculated: 1.98 Height: 5 ft 8 in Weight: 185 lb  Blood Pressure: 162 / 107 Temperature: 98.7 F Heart Rate: 70  Results/Data Selected Results  UA With REFLEX 12Apr2013 11:56AM Francis Jackson   Test Name Result Flag Reference  COLOR RED A YELLOW  Biochemicals may be affected by the color of the urine.  APPEARANCE CLOUDY A CLEAR  SPECIFIC GRAVITY 1.020  1.005-1.030  pH 8.0  5.0-8.0  GLUCOSE NEG mg/dL  NEG  BILIRUBIN NEG  NEG  KETONE TRACE mg/dL A NEG  BLOOD LARGE A NEG  PROTEIN > 300 mg/dL A NEG  UROBILINOGEN 2 mg/dL H 1.6-1.0  NITRITE POS A NEG  LEUKOCYTE ESTERASE MOD A NEG  SQUAMOUS EPITHELIAL/HPF NONE SEEN  RARE  WBC NONE SEEN WBC/hpf  <4  RBC TNTC RBC/hpf A <4  BACTERIA NONE SEEN  RARE  CRYSTALS NONE SEEN  NONE SEEN  CASTS NONE SEEN  NONE SEEN  Other UNSPUN MICRO     AU CT-HEMATURIA PROTOCOL 12Apr2013 12:00AM Francis Jackson   Test Name Result Flag Reference  ** RADIOLOGY REPORT BY [facility] ** ORIGINAL APPROVED BY: Consuello Bossier, M.D. ON:  12/21/2011 14:36:30   *RADIOLOGY REPORT*  Clinical Data: Gross and microscopic hematuria, starting 4 days ago. Pelvic and back pain. No history of renal stones.  CT ABDOMEN AND PELVIS WITHOUT AND WITH CONTRAST  Technique: Multidetector CT imaging of the abdomen and pelvis was performed without contrast material in one or both body regions, followed by contrast material(s) and further sections in one or both body regions.  Contrast: 125 ml Isovue 300  Comparison:  None.  Findings: Unenhanced images demonstrate punctate right renal collecting systems stones. No left-sided urinary tract calculi. Mild right hydroureter, secondary to a 6 mm stone at the right sided bladder base, likely the ureteric orifice. Image 69 series 2.  Post contrast images demonstrate 2 mm right lower lobe lung nodule on image 7 of series 6. Moderate cardiomegaly. No pericardial or pleural effusion. Normal liver, spleen, stomach, pancreas, gallbladder, biliary tract, adrenal glands.  No evidence of renal mass on corticomedullary phase imaging. Delayed images demonstrate minimal right-sided renal collecting system fullness. Good opacification of bilateral renal collecting systems and ureters. No filling defect identified.  No retroperitoneal or retrocrural adenopathy.  Minimal ascending colonic diverticulosis. Normal terminal ileum and appendix. Normal small bowel without abdominal ascites.  Fat containing right inguinal hernia. There has likely been prior left inguinal hernia repair. No pelvic adenopathy. The prostate is moderate to markedly enlarged for age. On delayed images, in addition to large prostate, there is a separate 4 cm non dependent filling defect, including on image 74 of series 5.  No significant free fluid. 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 Probable bone island in the left iliac wing, image 62 series 3.  IMPRESSION: 1. Right-sided bladder calculus, likely at the ureteric orifice. Resultant mild right hydroureter and right hydronephrosis. 2. Right renal calculi. 3. Moderate to marked prostatomegaly for age. 4. Bladder filling defect on delayed images, felt to be separate from the enlarged prostate. Favor hematoma. Depending on clinical suspicion, cystoscopy follow-up may be indicated.  5. 2 mm right lower lobe lung nodule. If the patient is at high risk for bronchogenic carcinoma, follow-up chest CT at 1 year  is recommended. If the patient is at low risk, no follow-up is needed.  This recommendation follows the consensus statement: "Guidelines for Management of Small Pulmonary Nodules Detected on CT Scans: A Statement from the Fleischner Society" as published in Radiology 2005; 237:395-400. Available online at: DietDisorder.cz.   Assessment Assessed  1. Gross Hematuria 599.71 2. Pyuria 791.9 3. Ureteral Stone 592.1 4. Nephrolithiasis 592.0  Plan  Gro  The patient has had multiple emergency room visits for gross hematuria and now urinary retention. He was diagnosed with a 6 mm distal ureteral stone that I was hoping he would pass spontaneously. We started him on a combination of Flomax and Avodart. I felt his gross hematuria was probably secondary to either the ureteral stone or has substantial prostatic middle lobe. Because he is returned to the emergency room on several occasions and now has urinary retention ID think intervention should be performed. I will plan on performing cystoscopy in the morning. We will also hopefully be able to perform ureteroscopy with holmium laser lithotripsy and potential double-J stent placement. I am concerned that the large middle lobe may make access to the ureter difficult and if necessary a small amount of prostate may need to be resected. We will admit him for observation along with continuous bladder irrigation and supportive care with pain medications and antispasmodics.

## 2011-12-22 NOTE — ED Notes (Signed)
When emptying leg  Bag for discharge, pt states "it's clotted again". Urine in bag was light red. Dr. Hyman Hopes informed. Ordered to irrigate and change catheter.

## 2011-12-22 NOTE — ED Notes (Signed)
Placed pt to 3 way [facility].  Urine is flowing at this time.  contiue to assess.

## 2011-12-22 NOTE — ED Notes (Signed)
Pt requesting to speak with urologist due to fear of going home with catheter and blood clots. RN explained the follow up information, informed Charge RN Fleet Contras and Dr. Hyacinth Meeker. Dr. Hyacinth Meeker reassured pt as well as RN. Pt verbalized understanding and agrees to plan.

## 2011-12-22 NOTE — Discharge Instructions (Signed)
Complete the full course of the cipro (antibiotic). Drink plenty of fluids. Empty leg bag as need. The urine catheter has a balloon in your bladder - DO NOT attempt to remove the catheter without the balloon first being deflated, or severe bladder/urethral injury can result. Follow up with urologist this Monday - call office to arrange appointment.  Your blood pressure today is high - follow up with primary care doctor in coming week for recheck of blood pressure.  Return to ER if worse, catheter not draining, severe abdominal pain, fevers, vomiting, weak/faint, other concern.  You were given pain medication in the ER - no driving for the next 6 hours.      Hematuria, Adult Hematuria (blood in your urine) can be caused by a bladder infection (cystitis), kidney infection (pyelonephritis), prostate infection (prostatitis), or kidney stone. Infections will usually respond to antibiotics (medications which kill germs), and a kidney stone will usually pass through your urine without further treatment. If you were put on antibiotics, take all the medicine until gone. You may feel better in a few days, but take all of your medicine or the infection may not respond and become more difficult to treat. If antibiotics were not given, an infection did not cause the blood in the urine. A further work up to find out the reason may be needed. HOME CARE INSTRUCTIONS   Drink lots of fluid, 3 to 4 quarts a day. If you have been diagnosed with an infection, cranberry juice is especially recommended, in addition to large amounts of water.   Avoid caffeine, tea, and carbonated beverages, because they tend to irritate the bladder.   Avoid alcohol as it may irritate the prostate.   Only take over-the-counter or prescription medicines for pain, discomfort, or fever as directed by your caregiver.   If you have been diagnosed with a kidney stone follow your caregivers instructions regarding straining your urine to catch  the stone.  TO PREVENT FURTHER INFECTIONS:  Empty the bladder often. Avoid holding urine for long periods of time.   After a bowel movement, women should cleanse front to back. Use each tissue only once.   Empty the bladder before and after sexual intercourse if you are a male.   Return to your caregiver if you develop back pain, fever, nausea (feeling sick to your stomach), vomiting, or your symptoms (problems) are not better in 3 days. Return sooner if you are getting worse.  If you have been requested to return for further testing make sure to keep your appointments. If an infection is not the cause of blood in your urine, X-rays may be required. Your caregiver will discuss this with you. SEEK IMMEDIATE MEDICAL CARE IF:   You have a persistent fever over 102 F (38.9 C).   You develop severe vomiting and are unable to keep the medication down.   You develop severe back or abdominal pain despite taking your medications.   You begin passing a large amount of blood or clots in your urine.   You feel extremely weak or faint, or pass out.  MAKE SURE YOU:   Understand these instructions.   Will watch your condition.   Will get help right away if you are not doing well or get worse.  Document Released: 08/27/2005 Document Revised: 08/16/2011 Document Reviewed: 04/15/2008 [facility] Patient Information 2012 [place], [place].     Foley Catheter Care, Adult A soft, flexible tube (Foley catheter) has been placed in your bladder. This may be done  to temporarily help with urine drainage after an operation or to relieve blockage from an enlarged prostate gland. HOME CARE INSTRUCTIONS  If you are going home with a Foley catheter in place, follow these instructions: Taking Care of the Catheter:  Keep the area where the catheter leaves your body clean.   Attach the catheter to the leg so there is no tension on the catheter.   Keep the drainage bag below the level of the bladder, but  keep it OFF the floor.   Do not take long soaking baths. Your caregiver will give instructions about showering.   Wash your hands before touching ANYTHING related to the catheter or bag.   Using mild soap and warm water on a washcloth:   Clean the area closest to the catheter insertion site using a circular motion around the catheter.   Clean the catheter itself by wiping AWAY from the insertion site for several inches down the tube.   NEVER wipe upward as this could sweep bacteria up into the urethra (tube in your body that normally drains the bladder) and cause infection.  Taking Care of the Drainage Bags:  Two drainage bags will be taken home: a large overnight drainage bag, and a smaller leg bag which fits underneath clothing.   It is okay to wear the overnight bag at any time, but NEVER wear the smaller leg bag at night.   Keep the drainage bag well below the level of your bladder. This prevents backflow of urine into the bladder and allows the urine to drain freely.   Anchor the tubing to your leg to prevent pulling or tension on the catheter. Use tape or a leg strap provided by the hospital.   Empty the drainage bag when it is  to  full. Wash your hands before and after touching the bag.   Periodically check the tubing for kinks to make sure there is no pressure on the tubing which could restrict the flow of urine.  Changing the Drainage Bags:  Cleanse both ends of the clean bag with alcohol before changing.   Pinch off the rubber catheter to avoid urine spillage during the disconnection.   Disconnect the dirty bag and connect the clean one.   Empty the dirty bag carefully to avoid a urine spill.   Attach the new bag to the leg with tape or a leg strap.  Cleaning the Drainage Bags:  Whenever a drainage bag is disconnected, it must be cleaned quickly so it is ready for the next use.   Wash the bag in warm, soapy water.   Rinse the bag thoroughly with warm water.    Soak the bag for 30 minutes in a solution of white vinegar and water (1 cup vinegar to 1 quart warm water).   Rinse with warm water.  SEEK MEDICAL CARE IF:   Some pain develops in the kidney (lower back) area.   The urine is cloudy or smells bad.   There is some blood in the urine.   The catheter becomes clogged and/or there is no urine drainage.  SEEK IMMEDIATE MEDICAL CARE IF:   You have moderate or severe pain in the kidney region.   You start to throw up (vomit).   Blood fills the tube.   Worsening belly (abdominal) pain develops.   You have a fever.  MAKE SURE YOU:   Understand these instructions.   Will watch your condition.   Will get help right away if you  are not doing well or get worse.  Document Released: 08/27/2005 Document Revised: 08/16/2011 Document Reviewed: 02/21/2007 [facility] Patient Information 2012 [place], [place].    Acute Urinary Retention, Male You have been seen by a caregiver today because of your inability to urinate (pass your water). This is a common problem in elderly males. As men age their prostates become larger and block the flow of urine from the bladder. This is usually a problem that has come on gradually. It is often first noticed by having to get up at night to urinate. This is because as the prostate enlarges it is more difficult to empty the bladder completely. Treatment may involve a one time catheterization to empty the bladder. This is putting in a tube to drain your urine. Then you and your personal caregiver can decide at your earliest convenience how to handle this problem in the future. It may also be a problem that may not recur for years. Sometimes this problem can be caused by medications. In this case, all that is often necessary is to discontinue the offending agent. If you are to leave the foley catheter (a long, narrow, hollow tube) in and go home with a drainage system, you will need to discuss the best course of action  with your caregiver. While the catheter is in, maintain a good intake of fluids. Keep the drainage bag emptied and lower than your catheter. This is so contaminated (infected) urine will not be flowing back into your bladder. This could lead to a urinary tract infection. Only take over-the-counter or prescription medicines for pain, discomfort, or fever as directed by your caregiver.  SEEK IMMEDIATE MEDICAL CARE IF:  You develop chills, fever, or show signs of generalized illness that occurs prior to seeing your caregiver. Document Released: 12/03/2000 Document Revised: 08/16/2011 Document Reviewed: 08/18/2008 [facility] Patient Information 2012 [place], [place].     Hypertension As your heart beats, it forces blood through your arteries. This force is your blood pressure. If the pressure is too high, it is called hypertension (HTN) or high blood pressure. HTN is dangerous because you may have it and not know it. High blood pressure may mean that your heart has to work harder to pump blood. Your arteries may be narrow or stiff. The extra work puts you at risk for heart disease, stroke, and other problems.  Blood pressure consists of two numbers, a higher number over a lower, 110/72, for example. It is stated as "110 over 72." The ideal is below 120 for the top number (systolic) and under 80 for the bottom (diastolic). Write down your blood pressure today. You should pay close attention to your blood pressure if you have certain conditions such as:  Heart failure.   Prior heart attack.   Diabetes   Chronic kidney disease.   Prior stroke.   Multiple risk factors for heart disease.  To see if you have HTN, your blood pressure should be measured while you are seated with your arm held at the level of the heart. It should be measured at least twice. A one-time elevated blood pressure reading (especially in the Emergency Department) does not mean that you need treatment. There may be conditions in  which the blood pressure is different between your right and left arms. It is important to see your caregiver soon for a recheck. Most people have essential hypertension which means that there is not a specific cause. This type of high blood pressure may be lowered by changing lifestyle  factors such as:  Stress.   Smoking.   Lack of exercise.   Excessive weight.   Drug/tobacco/alcohol use.   Eating less salt.  Most people do not have symptoms from high blood pressure until it has caused damage to the body. Effective treatment can often prevent, delay or reduce that damage. TREATMENT  When a cause has been identified, treatment for high blood pressure is directed at the cause. There are a large number of medications to treat HTN. These fall into several categories, and your caregiver will help you select the medicines that are best for you. Medications may have side effects. You should review side effects with your caregiver. If your blood pressure stays high after you have made lifestyle changes or started on medicines,   Your medication(s) may need to be changed.   Other problems may need to be addressed.   Be certain you understand your prescriptions, and know how and when to take your medicine.   Be sure to follow up with your caregiver within the time frame advised (usually within two weeks) to have your blood pressure rechecked and to review your medications.   If you are taking more than one medicine to lower your blood pressure, make sure you know how and at what times they should be taken. Taking two medicines at the same time can result in blood pressure that is too low.  SEEK IMMEDIATE MEDICAL CARE IF:  You develop a severe headache, blurred or changing vision, or confusion.   You have unusual weakness or numbness, or a faint feeling.   You have severe chest or abdominal pain, vomiting, or breathing problems.  MAKE SURE YOU:   Understand these instructions.   Will  watch your condition.   Will get help right away if you are not doing well or get worse.  Document Released: 08/27/2005 Document Revised: 08/16/2011 Document Reviewed: 04/16/2008 [facility] Patient Information 2012 [place], [place].

## 2011-12-22 NOTE — ED Notes (Signed)
Patient is alert and oriented x3.  He was given DC instructions and follow up visit instructions.  Patient gave verbal understanding.  He was DC ambulatory via wheelchair to home.  V/S stable.  He was not showing any signs of distress on DC

## 2011-12-23 ENCOUNTER — Observation Stay (HOSPITAL_COMMUNITY): Payer: Medicaid Other

## 2011-12-23 ENCOUNTER — Observation Stay (HOSPITAL_COMMUNITY): Payer: Medicaid Other | Admitting: Certified Registered Nurse Anesthetist

## 2011-12-23 ENCOUNTER — Encounter (HOSPITAL_COMMUNITY): Payer: Self-pay | Admitting: Certified Registered Nurse Anesthetist

## 2011-12-23 ENCOUNTER — Encounter (HOSPITAL_COMMUNITY)
Admission: EM | Disposition: A | Discharge status: Home / Self Care | Payer: Self-pay | Source: Home / Self Care | Attending: Urology

## 2011-12-23 HISTORY — PX: OTHER SURGICAL HISTORY: SHX169

## 2011-12-23 LAB — COMPREHENSIVE METABOLIC PANEL
CO2: 27 mEq/L (ref 19–32)
Calcium: 8.7 mg/dL (ref 8.4–10.5)
Creatinine, Ser: 0.95 mg/dL (ref 0.50–1.35)
GFR calc Af Amer: >90 mL/min (ref >90)
GFR calc non Af Amer: >90 mL/min (ref >90)
Glucose, Bld: 107 mg/dL — ABNORMAL HIGH (ref 70–99)

## 2011-12-23 LAB — CBC
Hemoglobin: 13.8 g/dL (ref 13.0–17.0)
RBC: 5.41 MIL/uL (ref 4.22–5.81)
WBC: 5.1 10*3/uL (ref 4.0–10.5)

## 2011-12-23 LAB — DIFFERENTIAL
Basophils Relative: 0 % (ref 0–1)
Lymphocytes Relative: 33 % (ref 12–46)
Lymphs Abs: 1.7 10*3/uL (ref 0.7–4.0)
Monocytes Relative: 16 % — ABNORMAL HIGH (ref 3–12)
Neutro Abs: 2.6 10*3/uL (ref 1.7–7.7)
Neutrophils Relative %: 51 % (ref 43–77)

## 2011-12-23 LAB — URINE CULTURE
Colony Count: NO GROWTH
Culture  Setup Time: 201304130454
Culture: NO GROWTH

## 2011-12-23 SURGERY — CYSTOURETEROSCOPY, WITH RETROGRADE PYELOGRAM AND STENT INSERTION
Anesthesia: General | Site: Ureter | Laterality: Right | Wound class: Clean Contaminated

## 2011-12-23 MED ORDER — INDIGOTINDISULFONATE SODIUM 8 MG/ML IJ SOLN
INTRAMUSCULAR | Status: AC
  Filled 2011-12-23: qty 5

## 2011-12-23 MED ORDER — OXYBUTYNIN CHLORIDE 5 MG PO TABS
5.0000 mg | ORAL_TABLET | Freq: Three times a day (TID) | ORAL | Status: DC | PRN
  Filled 2011-12-23: qty 1

## 2011-12-23 MED ORDER — MIDAZOLAM HCL 5 MG/5ML IJ SOLN
INTRAMUSCULAR | Status: DC | PRN
  Administered 2011-12-23: 2 mg via INTRAVENOUS

## 2011-12-23 MED ORDER — LIDOCAINE HCL 2 % EX GEL
CUTANEOUS | Status: AC
  Filled 2011-12-23: qty 10

## 2011-12-23 MED ORDER — LIDOCAINE HCL (CARDIAC) 20 MG/ML IV SOLN
INTRAVENOUS | Status: DC | PRN
  Administered 2011-12-23: 50 mg via INTRAVENOUS

## 2011-12-23 MED ORDER — FENTANYL CITRATE 0.05 MG/ML IJ SOLN
INTRAMUSCULAR | Status: DC | PRN
  Administered 2011-12-23: 50 ug via INTRAVENOUS

## 2011-12-23 MED ORDER — LACTATED RINGERS IV SOLN
INTRAVENOUS | Status: DC | PRN
  Administered 2011-12-23: 07:00:00 via INTRAVENOUS

## 2011-12-23 MED ORDER — INDIGOTINDISULFONATE SODIUM 8 MG/ML IJ SOLN
INTRAMUSCULAR | Status: DC | PRN
  Administered 2011-12-23: 5 mL via INTRAVENOUS

## 2011-12-23 MED ORDER — BELLADONNA ALKALOIDS-OPIUM 16.2-60 MG RE SUPP
RECTAL | Status: AC
  Filled 2011-12-23: qty 1

## 2011-12-23 MED ORDER — IOHEXOL 300 MG/ML  SOLN
INTRAMUSCULAR | Status: DC | PRN
  Administered 2011-12-23: 50 mL

## 2011-12-23 MED ORDER — 0.9 % SODIUM CHLORIDE (POUR BTL) OPTIME
TOPICAL | Status: DC | PRN
  Administered 2011-12-23: 1000 mL

## 2011-12-23 MED ORDER — KETOROLAC TROMETHAMINE 30 MG/ML IJ SOLN: 15.0000 mg | Freq: Once | INTRAMUSCULAR | Status: DC | PRN

## 2011-12-23 MED ORDER — OXYBUTYNIN CHLORIDE 5 MG PO TABS: 5.0000 mg | ORAL_TABLET | Freq: Three times a day (TID) | ORAL | Status: DC | PRN

## 2011-12-23 MED ORDER — LIDOCAINE HCL 2 % EX GEL
CUTANEOUS | Status: DC | PRN
  Administered 2011-12-23: 1 via URETHRAL

## 2011-12-23 MED ORDER — IOHEXOL 300 MG/ML  SOLN
INTRAMUSCULAR | Status: AC
  Filled 2011-12-23: qty 1

## 2011-12-23 MED ORDER — OXYMETAZOLINE HCL 0.05 % NA SOLN
NASAL | Status: AC
  Filled 2011-12-23: qty 15

## 2011-12-23 MED ORDER — CIPROFLOXACIN IN D5W 400 MG/200ML IV SOLN
INTRAVENOUS | Status: AC
  Filled 2011-12-23: qty 200

## 2011-12-23 MED ORDER — PROPOFOL 10 MG/ML IV BOLUS
INTRAVENOUS | Status: DC | PRN
  Administered 2011-12-23: 200 mg via INTRAVENOUS

## 2011-12-23 MED ORDER — FENTANYL CITRATE 0.05 MG/ML IJ SOLN: 25.0000 ug | INTRAMUSCULAR | Status: DC | PRN

## 2011-12-23 MED ORDER — HYDROCODONE-ACETAMINOPHEN 5-325 MG PO TABS: 1.0000 | ORAL_TABLET | ORAL | Status: AC | PRN

## 2011-12-23 MED ORDER — DEXAMETHASONE SODIUM PHOSPHATE 10 MG/ML IJ SOLN
INTRAMUSCULAR | Status: DC | PRN
  Administered 2011-12-23: 10 mg via INTRAVENOUS

## 2011-12-23 MED ORDER — PROMETHAZINE HCL 25 MG/ML IJ SOLN: 6.2500 mg | INTRAMUSCULAR | Status: DC | PRN

## 2011-12-23 MED ORDER — ONDANSETRON HCL 4 MG/2ML IJ SOLN
INTRAMUSCULAR | Status: DC | PRN
  Administered 2011-12-23: 4 mg via INTRAVENOUS

## 2011-12-23 MED ORDER — SODIUM CHLORIDE 0.9 % IR SOLN
Status: DC | PRN
Start: 2011-12-23 — End: 2011-12-23
  Administered 2011-12-23: 4000 mL

## 2011-12-23 SURGICAL SUPPLY — 15 items
BAG URINE DRAINAGE (UROLOGICAL SUPPLIES) ×2 IMPLANT
BAG URO CATCHER STRL LF (DRAPE) ×3 IMPLANT
CATH FOLEY 2WAY SLVR  5CC 20FR (CATHETERS) ×1
CATH FOLEY 2WAY SLVR 5CC 20FR (CATHETERS) ×1 IMPLANT
CATH URET 5FR 28IN OPEN ENDED (CATHETERS) ×3 IMPLANT
CLOTH BEACON ORANGE TIMEOUT ST (SAFETY) ×3 IMPLANT
DRAPE CAMERA CLOSED 9X96 (DRAPES) ×3 IMPLANT
GLOVE SURG SS PI 8.0 STRL IVOR (GLOVE) ×3 IMPLANT
GOWN PREVENTION PLUS XLARGE (GOWN DISPOSABLE) ×3 IMPLANT
GOWN STRL REIN XL XLG (GOWN DISPOSABLE) ×3 IMPLANT
MANIFOLD NEPTUNE II (INSTRUMENTS) ×3 IMPLANT
MARKER SKIN DUAL TIP RULER LAB (MISCELLANEOUS) ×1 IMPLANT
PACK CYSTO (CUSTOM PROCEDURE TRAY) ×3 IMPLANT
SYR CONTROL 10ML LL (SYRINGE) ×2 IMPLANT
TUBING CONNECTING 10 (TUBING) ×3 IMPLANT

## 2011-12-23 NOTE — Op Note (Signed)
Preoperative diagnosis: Right distal ureteral calculus, gross hematuria and urinary retention Postoperative diagnosis: Same  Procedure: Cystoscopy with right retrograde pyelography and ureteroscopy.   Surgeon: Valetta Fuller M.D.  Anesthesia: Gen.  Indications: The patient is 46 years of age and without prior urologic history. The patient had been to the emergency room on several occasions due to gross hematuria. He had been diagnosed with cystitis although definitive urine culture was not obtained. The patient subsequently was seen in my office on April 12. Because of ongoing gross hematuria without conclusive evidence of cystitis I obtained a hematuria protocol CT scan. This demonstrated mild obstruction of the right kidney and a 6 mm stone at the right ureterovesical junction. The patient also had a large middle lobe of the prostate. I felt that his gross hematuria was probably secondary to a distal stone but also could be partly due to some BPH. For that reason I started him acutely on Jaylan. He socially returned to the emergency room with urinary retention, gross hematuria and clots. A Foley catheter was replaced but then he came back to the emergency room with failure of catheter drainage. At that point we were contacted and I evaluated the patient last night. He sure it had improved but given the scenario multiple emergency room visits I felt it prudent to admit him to the hospital and proceed with a definitive assessment of his bladder. We felt it prudent to go after the stone if indeed it was still present. This was all discussed with the patient and full informed consent obtained. The patient has remained on oral ciprofloxacin.     Technique and findings: Patient was brought the operating room. Successful induction of general anesthesia. He was placed in lithotomy position prepped and draped in usual manner. Cystoscopy revealed unremarkable anterior urethra. The patient had some lateral lobe  prostate tissue but if rarely large middle lobe obscuring the trigone the bladder. I was able to identify the right ureteral orifice which appeared edematous and somewhat erythematous. I was able place a guidewire up to the right renal pelvis. I engaged the distal ureter with a rigid 6.5 French ureteroscope. The intramural ureter appeared again irritated inflamed and somewhat edematous. A definitive stone could not be seen. We continued ureteroscopy to the mid ureter without definitive calcification noted. The endoscopic findings were consistent with recent passage of stone.  Over the guidewire I placed an open-ended catheter. Retrograde pyelogram was done and a more formal manner to be sure there was nothing dermis and more proximally although again the stone was really quite distal on CT imaging. On retrograde pyelography with fluoroscopic interpretation the ureter appeared to be without evidence of obstruction or filling defects and the contrast drained very quickly and appropriately.  A new 20 French Foley catheter was inserted and urine was clear. The patient was brought to recovery room in stable condition having had no obvious complications or problems.

## 2011-12-23 NOTE — Anesthesia Postprocedure Evaluation (Signed)
  Anesthesia Post-op Note  Patient: Francis Jackson  Procedure(s) Performed: Procedure(s) (LRB): CYSTOSCOPY WITH RETROGRADE PYELOGRAM, URETEROSCOPY AND STENT PLACEMENT (Right)  Patient Location: PACU  Anesthesia Type: General  Level of Consciousness: awake and alert   Airway and Oxygen Therapy: Patient Spontanous Breathing  Post-op Pain: mild  Post-op Assessment: Post-op Vital signs reviewed, Patient's Cardiovascular Status Stable, Respiratory Function Stable, Patent Airway and No signs of Nausea or vomiting  Post-op Vital Signs: stable  Complications: No apparent anesthesia complications

## 2011-12-23 NOTE — Anesthesia Preprocedure Evaluation (Addendum)
Anesthesia Evaluation  Patient identified by MRN, date of birth, ID band Patient awake    Reviewed: Allergy & Precautions, H&P , NPO status , Patient's Chart, lab work & pertinent test results  Airway Mallampati: II TM Distance: >3 FB Neck ROM: Full    Dental No notable dental hx.    Pulmonary neg pulmonary ROS,  breath sounds clear to auscultation  Pulmonary exam normal       Cardiovascular negative cardio ROS  Rhythm:Regular Rate:Normal     Neuro/Psych Seizures -, Well Controlled,  negative psych ROS   GI/Hepatic negative GI ROS, Neg liver ROS,   Endo/Other  negative endocrine ROS  Renal/GU negative Renal ROS  negative genitourinary   Musculoskeletal negative musculoskeletal ROS (+)   Abdominal   Peds negative pediatric ROS (+)  Hematology negative hematology ROS (+)   Anesthesia Other Findings   Reproductive/Obstetrics negative OB ROS                           Anesthesia Physical Anesthesia Plan  ASA: II and Emergent  Anesthesia Plan: General   Post-op Pain Management:    Induction: Intravenous  Airway Management Planned: LMA  Additional Equipment:   Intra-op Plan:   Post-operative Plan:   Informed Consent: I have reviewed the patients History and Physical, chart, labs and discussed the procedure including the risks, benefits and alternatives for the proposed anesthesia with the patient or authorized representative who has indicated his/her understanding and acceptance.   Dental advisory given  Plan Discussed with: CRNA  Anesthesia Plan Comments:        Anesthesia Quick Evaluation

## 2011-12-23 NOTE — Discharge Instructions (Signed)
Cystoscopy patient instructions  Following a cystoscopy, a catheter (a flexible rubber tube) is sometimes left in place to empty the bladder. This may cause some discomfort or a feeling that you need to urinate. Your doctor determines the period of time that the catheter will be left in place. You may have bloody urine for two to three days (Call your doctor if the amount of bleeding increases or does not subside).  You may pass blood clots in your urine, especially if you had a biopsy. It is not unusual to pass small blood clots and have some bloody urine a couple of weeks after your cystoscopy. Again, call your doctor if the bleeding does not subside. You may have: Dysuria (painful urination) Frequency (urinating often) Urgency (strong desire to urinate)  These symptoms are common especially if medicine is instilled into the bladder or a ureteral stent is placed. Avoiding alcohol and caffeine, such as coffee, tea, and chocolate, may help relieve these symptoms. Drink plenty of water, unless otherwise instructed. Your doctor may also prescribe an antibiotic or other medicine to reduce these symptoms.  Cystoscopy results are available soon after the procedure; biopsy results usually take two to four days. Your doctor will discuss the results of your exam with you. Before you go home, you will be given specific instructions for follow-up care. Special Instructions:  1 If you are going home with a catheter in place do not take a tub bath until removed by your doctor.  2 You may resume your normal activities.  3 Do not drive or operate machinery if you are taking narcotic pain medicine.  4 Be sure to keep all follow-up appointments with your doctor.   5 Call Your Doctor If: The catheter is not draining  You have severe pain  You are unable to urinate  You have a fever over 101  You have severe bleeding          We will call you Monday morning about followup.

## 2011-12-23 NOTE — Progress Notes (Addendum)
Patient returned from OR.  Complaining of bladder pressure and feel of needing to urinate.  Gave patient BO suppository.  On follow-up patient denies pain. Provided bladder catheter teaching.  Patient not ready to go home.  Encouraged patient to get to the chair to increase activity.  Will continue to monitor patient for discharge.

## 2011-12-23 NOTE — Progress Notes (Signed)
Pt c/o fullness in bladder. Assessed cbi: speed at low. Hand irrigated with NS; returned. Repeated until remaining returned. Clear, pink in color. Turned CBI to medium flow. It is flowing freely and patient is comfortable presently. I will continue to monitor him throughtout remainder of shift.

## 2011-12-23 NOTE — Discharge Summary (Signed)
Physician Discharge Summary  Patient ID: Francis Jackson MRN: [id number] DOB/AGE: June 15, 1966 46 y.o.  Admit date: 12/22/2011 Discharge date: 12/23/2011  Admission Diagnoses:  Discharge Diagnoses:  Active Problems:  Gross hematuria  Acute urinary retention  Ureteral calculus   Discharged Condition: good  Hospital Course: See operative note for discussion of the patient's situation. He presented with gross hematuria and urinary retention. He had multiple trips to the emergency room. CT scan in our office revealed a 6 mm distal right ureteral calculus. His gross hematuria was thought to be secondary to the stone plus or minus BPH. He was admitted for supportive care. The next morning he was taken to surgery. Cystoscopically he had a markedly enlarged prostate with a large middle lobe that was oozing slightly. The patient had endoscopic assessment of his right distal ureter but the stone was not found although there was evidence that he had had a stone in that location recently given the inflammation and edema. It appears that he passed the stone spontaneously sometime in the 24-hour 6 status post his CT scan in our office. At the completion of the procedure the patient's urine was relatively clear. We felt he could be managed as an outpatient he was discharged home with a Foley catheter.  Consults: None  Significant Diagnostic Studies: None  Treatments: Surgical procedure as discussed above.  Discharge Exam: Blood pressure 155/100, pulse 78, temperature 99.1 F (37.3 C), temperature source Oral, resp. rate 15, height 5\' 8"  (1.727 m), weight 81.8 kg (180 lb 5.4 oz), SpO2 96.00%. At discharge the patient is a well-developed  male in no acute distress. Respiratory effort was normal. Patient's abdomen was benign. External genitalia revealed a Foley catheter draining relatively clear urine.  Disposition: 01-Home or Self Care   Medication List  As of 12/23/2011  4:41 PM   STOP taking these  medications         ciprofloxacin 500 MG tablet         TAKE these medications         glucosamine-chondroitin 500-400 MG tablet   Take 1 tablet by mouth 2 (two) times daily.      HYDROcodone-acetaminophen 5-325 MG per tablet   Commonly known as: NORCO   Take 2 tablets by mouth every 4 (four) hours as needed for pain.      HYDROcodone-acetaminophen 5-325 MG per tablet   Commonly known as: NORCO   Take 1-2 tablets by mouth every 4 (four) hours as needed.      JALYN 0.5-0.4 MG Caps   Generic drug: Dutasteride-Tamsulosin HCl   Take 1 tablet by mouth daily.      levETIRAcetam 500 MG tablet   Commonly known as: KEPPRA   Take 500 mg by mouth every 12 (twelve) hours.      naproxen 500 MG tablet   Commonly known as: NAPROSYN   Take 500 mg by mouth 2 (two) times daily as needed.      oxybutynin 5 MG tablet   Commonly known as: DITROPAN   Take 1 tablet (5 mg total) by mouth every 8 (eight) hours as needed (spasms).      Tamsulosin HCl 0.4 MG Caps   Commonly known as: FLOMAX   Take 1 capsule (0.4 mg total) by mouth daily.           Follow-up Information    Follow up with [facility] S, MD. Schedule an appointment as soon as possible for a visit in 2 days. (as previously suggested)  Contact information:   [address] [facility] [place] [address] [phone]       Follow up with [facility]EMERGENCY DEPT. (As needed if symptoms worsen)    Contact information:   [address] [place] [address] [phone]         Signed: Valetta Fuller 12/23/2011, 4:41 PM

## 2011-12-23 NOTE — Transfer of Care (Signed)
Immediate Anesthesia Transfer of Care Note  Patient: Francis Jackson  Procedure(s) Performed: Procedure(s) (LRB): CYSTOSCOPY WITH RETROGRADE PYELOGRAM, URETEROSCOPY AND STENT PLACEMENT (Right)  Patient Location: PACU  Anesthesia Type: General  Level of Consciousness: awake and alert   Airway & Oxygen Therapy: Patient Spontanous Breathing and Patient connected to face mask oxygen  Post-op Assessment: Report given to PACU RN and Post -op Vital signs reviewed and stable  Post vital signs: Reviewed and stable  Complications: No apparent anesthesia complications

## 2012-02-12 ENCOUNTER — Emergency Department (HOSPITAL_COMMUNITY)
Admission: EM | Admit: 2012-02-12 | Discharge: 2012-02-12 | Disposition: A | Discharge status: Home / Self Care | Payer: Medicaid Other | Attending: Emergency Medicine | Admitting: Emergency Medicine

## 2012-02-12 ENCOUNTER — Encounter (HOSPITAL_COMMUNITY): Payer: Self-pay | Admitting: Emergency Medicine

## 2012-02-12 ENCOUNTER — Emergency Department (HOSPITAL_COMMUNITY): Payer: Medicaid Other

## 2012-02-12 DIAGNOSIS — Z9119 Patient's noncompliance with other medical treatment and regimen: Secondary | ICD-10-CM | POA: Insufficient documentation

## 2012-02-12 DIAGNOSIS — R569 Unspecified convulsions: Secondary | ICD-10-CM | POA: Insufficient documentation

## 2012-02-12 DIAGNOSIS — S0081XA Abrasion of other part of head, initial encounter: Secondary | ICD-10-CM

## 2012-02-12 DIAGNOSIS — Z91199 Patient's noncompliance with other medical treatment and regimen due to unspecified reason: Secondary | ICD-10-CM | POA: Insufficient documentation

## 2012-02-12 DIAGNOSIS — R51 Headache: Secondary | ICD-10-CM | POA: Insufficient documentation

## 2012-02-12 DIAGNOSIS — Z9114 Patient's other noncompliance with medication regimen: Secondary | ICD-10-CM

## 2012-02-12 DIAGNOSIS — W1809XA Striking against other object with subsequent fall, initial encounter: Secondary | ICD-10-CM | POA: Insufficient documentation

## 2012-02-12 DIAGNOSIS — IMO0002 Reserved for concepts with insufficient information to code with codable children: Secondary | ICD-10-CM | POA: Insufficient documentation

## 2012-02-12 HISTORY — DX: Migraine, unspecified, not intractable, without status migrainosus: G43.909

## 2012-02-12 LAB — CBC
HCT: 44.7 % (ref 39.0–52.0)
MCV: 77.1 fL — ABNORMAL LOW (ref 78.0–100.0)
RBC: 5.8 MIL/uL (ref 4.22–5.81)
WBC: 9.4 10*3/uL (ref 4.0–10.5)

## 2012-02-12 LAB — COMPREHENSIVE METABOLIC PANEL
BUN: 15 mg/dL (ref 6–23)
CO2: 26 mEq/L (ref 19–32)
Chloride: 103 mEq/L (ref 96–112)
Creatinine, Ser: 0.9 mg/dL (ref 0.50–1.35)
GFR calc Af Amer: >90 mL/min (ref >90)
GFR calc non Af Amer: >90 mL/min (ref >90)
Glucose, Bld: 88 mg/dL (ref 70–99)
Total Bilirubin: 0.3 mg/dL (ref 0.3–1.2)

## 2012-02-12 MED ORDER — SODIUM CHLORIDE 0.9 % IV SOLN
1000.0000 mg | Freq: Once | INTRAVENOUS | Status: AC
  Administered 2012-02-12: 1000 mg via INTRAVENOUS
  Filled 2012-02-12: qty 10

## 2012-02-12 MED ORDER — MORPHINE SULFATE 4 MG/ML IJ SOLN
4.0000 mg | Freq: Once | INTRAMUSCULAR | Status: AC
  Administered 2012-02-12: 4 mg via INTRAVENOUS
  Filled 2012-02-12: qty 1
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
## 2012-02-12 NOTE — ED Notes (Signed)
Patient not in room.will attempt to obtain labs upon patient's return 

## 2012-02-12 NOTE — ED Notes (Signed)
EAV:[id number]<JX> Expected date:02/12/12<BR> Expected time:<BR> Means of arrival:<BR> Comments:<BR> EMS 70 GC - seizures

## 2012-02-12 NOTE — ED Notes (Signed)
Last seizure last summer, removed from back board by Dr. Patria Mane-- C-Collar still in place.

## 2012-02-12 NOTE — ED Notes (Signed)
Pt given discharge instructions, explained them to him, family at bedside to take home, pt escorted to discharge window in wheelchair.
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
## 2012-12-18 ENCOUNTER — Other Ambulatory Visit: Payer: Self-pay | Admitting: Neurology

## 2013-02-07 ENCOUNTER — Emergency Department (HOSPITAL_COMMUNITY): Payer: Medicaid Other

## 2013-02-07 ENCOUNTER — Emergency Department (HOSPITAL_COMMUNITY)
Admission: EM | Admit: 2013-02-07 | Discharge: 2013-02-07 | Disposition: A | Discharge status: Home / Self Care | Payer: Medicaid Other | Attending: Emergency Medicine | Admitting: Emergency Medicine

## 2013-02-07 ENCOUNTER — Encounter (HOSPITAL_COMMUNITY): Payer: Self-pay | Admitting: Emergency Medicine

## 2013-02-07 DIAGNOSIS — G40909 Epilepsy, unspecified, not intractable, without status epilepticus: Secondary | ICD-10-CM | POA: Insufficient documentation

## 2013-02-07 DIAGNOSIS — Z87440 Personal history of urinary (tract) infections: Secondary | ICD-10-CM | POA: Insufficient documentation

## 2013-02-07 DIAGNOSIS — Z8679 Personal history of other diseases of the circulatory system: Secondary | ICD-10-CM | POA: Insufficient documentation

## 2013-02-07 DIAGNOSIS — R569 Unspecified convulsions: Secondary | ICD-10-CM

## 2013-02-07 DIAGNOSIS — F29 Unspecified psychosis not due to a substance or known physiological condition: Secondary | ICD-10-CM | POA: Insufficient documentation

## 2013-02-07 LAB — CBC WITH DIFFERENTIAL/PLATELET
Basophils Relative: 0 % (ref 0–1)
Eosinophils Absolute: 0.1 10*3/uL (ref 0.0–0.7)
Eosinophils Relative: 2 % (ref 0–5)
Hemoglobin: 14.8 g/dL (ref 13.0–17.0)
Lymphs Abs: 1.9 10*3/uL (ref 0.7–4.0)
MCH: 25.5 pg — ABNORMAL LOW (ref 26.0–34.0)
MCHC: 32.8 g/dL (ref 30.0–36.0)
MCV: 77.6 fL — ABNORMAL LOW (ref 78.0–100.0)
Monocytes Relative: 5 % (ref 3–12)
RBC: 5.81 MIL/uL (ref 4.22–5.81)

## 2013-02-07 LAB — BASIC METABOLIC PANEL
BUN: 14 mg/dL (ref 6–23)
Calcium: 8.9 mg/dL (ref 8.4–10.5)
Creatinine, Ser: 1.02 mg/dL (ref 0.50–1.35)
GFR calc non Af Amer: 86 mL/min — ABNORMAL LOW (ref >90)
Glucose, Bld: 138 mg/dL — ABNORMAL HIGH (ref 70–99)

## 2013-02-07 MED ORDER — SODIUM CHLORIDE 0.9 % IV SOLN
1000.0000 mg | Freq: Once | INTRAVENOUS | Status: AC
  Administered 2013-02-07: 1000 mg via INTRAVENOUS
  Filled 2013-02-07: qty 10

## 2013-02-07 NOTE — ED Notes (Signed)
JWJ:[id number]<NW> Expected date:02/07/13<BR> Expected time:12:42 PM<BR> Means of arrival:<BR> Comments:<BR> Seizure
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
## 2013-02-09 NOTE — ED Provider Notes (Signed)
History     CSN: [id number]  Arrival date & time 02/07/13  1247   First MD Initiated Contact with Patient 02/07/13 1255      Chief Complaint  Patient presents with  . Seizures    (Consider location/radiation/quality/duration/timing/severity/associated sxs/prior treatment) Patient is a 47 y.o. male presenting with seizures. The history is provided by the patient (the pt has a hx of sz.  he has not been taking his meds and had a sz today).  Seizures Seizure activity on arrival: no   Seizure type:  Grand mal Preceding symptoms: no sensation of an aura present   Initial focality:  None Episode characteristics: no apnea   Postictal symptoms: confusion     Past Medical History  Diagnosis Date  . Seizure   . Migraine   . UTI (lower urinary tract infection)     Past Surgical History  Procedure Laterality Date  . Shoulder surgery      left    History reviewed. No pertinent family history.  History  Substance Use Topics  . Smoking status: Never Smoker   . Smokeless tobacco: Not on file  . Alcohol Use: No      Review of Systems  Constitutional: Negative for appetite change and fatigue.  HENT: Negative for congestion, sinus pressure and ear discharge.   Eyes: Negative for discharge.  Respiratory: Negative for cough.   Cardiovascular: Negative for chest pain.  Gastrointestinal: Negative for abdominal pain and diarrhea.  Genitourinary: Negative for frequency and hematuria.  Musculoskeletal: Negative for back pain.  Skin: Negative for rash.  Neurological: Positive for seizures. Negative for headaches.  Psychiatric/Behavioral: Negative for hallucinations.    Allergies  Review of patient's allergies indicates no known allergies.  Home Medications   Current Outpatient Rx  Name  Route  Sig  Dispense  Refill  . levETIRAcetam (KEPPRA) 500 MG tablet      TAKE 1 TABLET TWICE A DAY   60 tablet   3     Please Schedule Appt   . naproxen (NAPROSYN) 500 MG tablet  Oral   Take 500 mg by mouth 2 (two) times daily as needed.         . Tamsulosin HCl (FLOMAX) 0.4 MG CAPS   Oral   Take 1 capsule (0.4 mg total) by mouth daily.   10 capsule   0     BP 133/84  Pulse 64  Temp(Src) 98 F (36.7 C) (Oral)  Resp 13  SpO2 100%  Physical Exam  Constitutional: He is oriented to person, place, and time. He appears well-developed.  HENT:  Head: Normocephalic.  Eyes: Conjunctivae and EOM are normal. No scleral icterus.  Neck: Neck supple. No thyromegaly present.  Cardiovascular: Normal rate and regular rhythm.  Exam reveals no gallop and no friction rub.   No murmur heard. Pulmonary/Chest: No stridor. He has no wheezes. He has no rales. He exhibits no tenderness.  Abdominal: He exhibits no distension. There is no tenderness. There is no rebound.  Musculoskeletal: Normal range of motion. He exhibits no edema.  Lymphadenopathy:    He has no cervical adenopathy.  Neurological: He is oriented to person, place, and time. Coordination normal.  Skin: No rash noted. No erythema.  Psychiatric: He has a normal mood and affect. His behavior is normal.    ED Course  Procedures (including critical care time)  Labs Reviewed  CBC WITH DIFFERENTIAL - Abnormal; Notable for the following:    MCV 77.6 (*)    [facility]  25.5 (*)    All other components within normal limits  BASIC METABOLIC PANEL - Abnormal; Notable for the following:    Glucose, Bld 138 (*)    GFR calc non Af Amer 86 (*)    All other components within normal limits   No results found.   1. Seizure       MDM         Benny Lennert, MD 02/09/13 2038

## 2013-03-05 ENCOUNTER — Emergency Department (HOSPITAL_COMMUNITY): Payer: No Typology Code available for payment source

## 2013-03-05 ENCOUNTER — Encounter (HOSPITAL_COMMUNITY): Payer: Self-pay | Admitting: *Deleted

## 2013-03-05 ENCOUNTER — Emergency Department (HOSPITAL_COMMUNITY)
Admission: EM | Admit: 2013-03-05 | Discharge: 2013-03-05 | Disposition: A | Discharge status: Home / Self Care | Payer: No Typology Code available for payment source | Attending: Emergency Medicine | Admitting: Emergency Medicine

## 2013-03-05 DIAGNOSIS — G40909 Epilepsy, unspecified, not intractable, without status epilepticus: Secondary | ICD-10-CM | POA: Insufficient documentation

## 2013-03-05 DIAGNOSIS — R0789 Other chest pain: Secondary | ICD-10-CM

## 2013-03-05 DIAGNOSIS — Z79899 Other long term (current) drug therapy: Secondary | ICD-10-CM | POA: Insufficient documentation

## 2013-03-05 DIAGNOSIS — Y9389 Activity, other specified: Secondary | ICD-10-CM | POA: Insufficient documentation

## 2013-03-05 DIAGNOSIS — Y92410 Unspecified street and highway as the place of occurrence of the external cause: Secondary | ICD-10-CM | POA: Insufficient documentation

## 2013-03-05 DIAGNOSIS — Z87440 Personal history of urinary (tract) infections: Secondary | ICD-10-CM | POA: Insufficient documentation

## 2013-03-05 DIAGNOSIS — S298XXA Other specified injuries of thorax, initial encounter: Secondary | ICD-10-CM | POA: Insufficient documentation

## 2013-03-05 DIAGNOSIS — Z8679 Personal history of other diseases of the circulatory system: Secondary | ICD-10-CM | POA: Insufficient documentation

## 2013-03-05 LAB — BASIC METABOLIC PANEL
Chloride: 104 mEq/L (ref 96–112)
GFR calc Af Amer: >90 mL/min (ref >90)
GFR calc non Af Amer: >90 mL/min (ref >90)
Glucose, Bld: 95 mg/dL (ref 70–99)
Potassium: 3.7 mEq/L (ref 3.5–5.1)
Sodium: 142 mEq/L (ref 135–145)

## 2013-03-05 LAB — POCT I-STAT TROPONIN I

## 2013-03-05 LAB — CBC
Hemoglobin: 14.3 g/dL (ref 13.0–17.0)
MCHC: 33.9 g/dL (ref 30.0–36.0)
RDW: 13.8 % (ref 11.5–15.5)
WBC: 4.4 10*3/uL (ref 4.0–10.5)

## 2013-03-05 MED ORDER — OXYCODONE-ACETAMINOPHEN 5-325 MG PO TABS
2.0000 | ORAL_TABLET | Freq: Once | ORAL | Status: AC
  Administered 2013-03-05: 2 via ORAL
  Filled 2013-03-05: qty 2

## 2013-03-05 MED ORDER — HYDROCODONE-ACETAMINOPHEN 5-325 MG PO TABS: 1.0000 | ORAL_TABLET | ORAL | Status: DC | PRN
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
## 2013-03-05 NOTE — ED Provider Notes (Signed)
I saw and evaluated the patient, reviewed the resident's note and I agree with the findings and plan. Reproduced pain with palpation. Pt is comfortable. No suspicion for sternal fracture  Loren Racer, MD 03/05/13 2342
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
## 2013-04-29 ENCOUNTER — Other Ambulatory Visit: Payer: Self-pay

## 2013-04-29 MED ORDER — LEVETIRACETAM 500 MG PO TABS: 500.0000 mg | ORAL_TABLET | Freq: Two times a day (BID) | ORAL | Status: DC

## 2013-04-29 NOTE — Telephone Encounter (Signed)
Patient has scheduled an appt in Dec

## 2013-07-22 ENCOUNTER — Other Ambulatory Visit: Payer: Self-pay | Admitting: Urology

## 2013-08-18 ENCOUNTER — Ambulatory Visit: Payer: Self-pay | Admitting: Neurology

## 2013-08-21 ENCOUNTER — Encounter (HOSPITAL_BASED_OUTPATIENT_CLINIC_OR_DEPARTMENT_OTHER): Payer: Self-pay | Admitting: *Deleted

## 2013-08-21 NOTE — Progress Notes (Signed)
NPO AFTER MN. ARRIVE AT 0715. NEEDS HG. WILL TAKE KEPPRA AM DOS W/ SIP OF WATER. REVIEWED [facility] GUIDELINES, WILL BRING MEDS.

## 2013-08-21 NOTE — H&P (Signed)
Reason For Visit  Patient presents today for elective TURP.  He is known to have a markedly enlarged middle lobe of the prostate.  He was interested in definitive treatment of his prostatic obstruction.  He wanted to get off long-term medical therapy.     Repeat PSA in April was down to 3.3. Urine today clear.   Past Medical History Problems  1. 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 History of Hernia Repair 2. History of Shoulder Surgery  Current Meds 1. Finasteride 5 MG Oral Tablet; Take 1 tablet every day;  Therapy: 07May2013 to (Evaluate:10May2015)  Requested for: 15May2014; Last  Rx:15May2014 Ordered 2. Glucosamine Chondroitin Complx 500-400 MG TABS;  Therapy: (Recorded:12Apr2013) to Recorded 3. Keppra 500 MG Oral Tablet;  Therapy: (Recorded:12Apr2013) to Recorded 4. Naproxen 500 MG Oral Tablet;  Therapy: (Recorded:30Apr2013) to Recorded 5. Tamsulosin HCl - 0.4 MG Oral Capsule; TAKE 1 CAPSULE EVERY DAY;  Therapy: 07May2013 to (Evaluate:10May2015)  Requested for: 15May2014; Last  Rx:15May2014 Ordered  Allergies Medication  1. No Known Drug Allergies  Family History Problems  1. Family history of Death In The Family Father : Father 2. Family history of Death In The Family Mother : Mother 3. Family history of Family Health Status Number Of Children   2 sons and 3 daughters  Social History Problems  1. Denied: History of Alcohol Use 2. Denied: History of Caffeine Use 3. Marital History - Currently Married 4. Never A Smoker 5. Occupation:   unemployed 6. Denied: History of Tobacco Use  Review of Systems Genitourinary, constitutional, skin, eye, otolaryngeal, hematologic/lymphatic, cardiovascular, pulmonary, endocrine, musculoskeletal, gastrointestinal, neurological and psychiatric system(s) were reviewed and pertinent findings if present are noted.  Genitourinary: feelings of urinary urgency,  nocturia and post-void dribbling, but no urinary frequency, urine stream is not weak and no hematuria.  Musculoskeletal: back pain.  Neurological: headache.    Vitals  Blood Pressure: 142 / 97 Temperature: 98.3 F Heart Rate: 90  Results/Data  COLOR YELLOW  APPEARANCE CLEAR  SPECIFIC GRAVITY 1.025  pH 6.0  GLUCOSE NEG mg/dL BILIRUBIN NEG  KETONE NEG mg/dL BLOOD NEG  PROTEIN NEG mg/dL UROBILINOGEN 0.2 mg/dL NITRITE NEG  LEUKOCYTE ESTERASE NEG   Assessment Assessed  1. Elevated prostate specific antigen (PSA) (790.93) 2. Benign prostatic hyperplasia with urinary obstruction (600.01,599.69)  End of Encounter Meds  Medication Name Instruction Finasteride 5 MG Oral Tablet Take 1 tablet every day Glucosamine Chondroitin Complx 500-400 MG TABS  Keppra 500 MG Oral Tablet (LevETIRAcetam)  Naproxen 500 MG Oral Tablet  Tamsulosin HCl - 0.4 MG Oral Capsule TAKE 1 CAPSULE EVERY DAY  Plan Benign prostatic hyperplasia with urinary obstruction  1. PVR U/S; Status:Hold For - Appointment,Date of Service; Requested for:11Nov2014;  2. Follow-up Month x 6 Office  Follow-up  Status: Hold For - Appointment,Date of Service   Requested for: 11Nov2014 3. Follow-up Schedule Surgery Office  Follow-up  Status: Hold For - Appointment   Requested for: 11Nov2014 Elevated prostate specific antigen (PSA)  4. PSA REFLEX TO FREE; Status:Resulted - Requires Verification;   Done: 11Nov2014  04:49PM 5. VENIPUNCTURE; Status:Complete;   Done: 11Nov2014 Health Maintenance  6. UA With REFLEX; Status:Complete;   Done: 11Nov2014 04:10PM  Discussion/Summary   Koffie continues to do substantially better than he had previously. Again, he appears to have had significant voiding issues secondary to ureteral stone that subsequently passed along with some fairly massive BPH. He is known to have a very  large middle lobe of his prostate. His voiding has been better on a combination of alpha-blocker and finasteride,  but he is wondering if he could have something done to allow him to get off the medical therapy. We did talk about a TURP. A Gyrus TURP would be limited primarily to his middle lobe, which I think is really the culprit here for him. We talked about the procedure, risks, benefits, and typical recovery. I would anticipate overnight stay in the hospital with 2-3 days of catheter drainage. His PSA data has been looking much more encouraging. We will repeat that today. His urine is clear. He is interested in proceeding with a more definitive procedure and given the entire situation, that seems quite reasonable. We will see about getting him on the schedule for sometime in the next 4-6 weeks.

## 2013-08-24 ENCOUNTER — Encounter (HOSPITAL_BASED_OUTPATIENT_CLINIC_OR_DEPARTMENT_OTHER)
Admission: RE | Disposition: A | Discharge status: Home / Self Care | Payer: Self-pay | Source: Ambulatory Visit | Attending: Urology

## 2013-08-24 ENCOUNTER — Encounter (HOSPITAL_BASED_OUTPATIENT_CLINIC_OR_DEPARTMENT_OTHER): Payer: Medicaid Other | Admitting: Anesthesiology

## 2013-08-24 ENCOUNTER — Encounter (HOSPITAL_BASED_OUTPATIENT_CLINIC_OR_DEPARTMENT_OTHER): Payer: Self-pay | Admitting: *Deleted

## 2013-08-24 ENCOUNTER — Ambulatory Visit (HOSPITAL_BASED_OUTPATIENT_CLINIC_OR_DEPARTMENT_OTHER): Payer: Medicaid Other | Admitting: Anesthesiology

## 2013-08-24 ENCOUNTER — Ambulatory Visit (HOSPITAL_BASED_OUTPATIENT_CLINIC_OR_DEPARTMENT_OTHER)
Admission: RE | Admit: 2013-08-24 | Discharge: 2013-08-25 | Disposition: A | Discharge status: Home / Self Care | Payer: Medicaid Other | Source: Ambulatory Visit | Attending: Urology | Admitting: Urology

## 2013-08-24 DIAGNOSIS — N138 Other obstructive and reflux uropathy: Secondary | ICD-10-CM

## 2013-08-24 DIAGNOSIS — Z79899 Other long term (current) drug therapy: Secondary | ICD-10-CM | POA: Insufficient documentation

## 2013-08-24 DIAGNOSIS — R972 Elevated prostate specific antigen [PSA]: Secondary | ICD-10-CM | POA: Insufficient documentation

## 2013-08-24 DIAGNOSIS — N401 Enlarged prostate with lower urinary tract symptoms: Secondary | ICD-10-CM | POA: Insufficient documentation

## 2013-08-24 DIAGNOSIS — N400 Enlarged prostate without lower urinary tract symptoms: Secondary | ICD-10-CM | POA: Diagnosis present

## 2013-08-24 DIAGNOSIS — Z86718 Personal history of other venous thrombosis and embolism: Secondary | ICD-10-CM | POA: Insufficient documentation

## 2013-08-24 DIAGNOSIS — N139 Obstructive and reflux uropathy, unspecified: Secondary | ICD-10-CM | POA: Insufficient documentation

## 2013-08-24 HISTORY — DX: Obstructive sleep apnea (adult) (pediatric): G47.33

## 2013-08-24 HISTORY — DX: Benign prostatic hyperplasia without lower urinary tract symptoms: N40.0

## 2013-08-24 HISTORY — DX: Unspecified convulsions: R56.9

## 2013-08-24 HISTORY — DX: Personal history of urinary calculi: Z87.442

## 2013-08-24 HISTORY — DX: Personal history of other specified conditions: Z87.898

## 2013-08-24 HISTORY — DX: Other generalized epilepsy and epileptic syndromes, not intractable, without status epilepticus: G40.409

## 2013-08-24 HISTORY — PX: TRANSURETHRAL RESECTION OF PROSTATE: SHX73

## 2013-08-24 LAB — POCT HEMOGLOBIN-HEMACUE: Hemoglobin: 16.3 g/dL (ref 13.0–17.0)

## 2013-08-24 SURGERY — TRANSURETHRAL RESECTION OF THE PROSTATE WITH GYRUS INSTRUMENTS
Anesthesia: General | Site: Prostate

## 2013-08-24 MED ORDER — KETOROLAC TROMETHAMINE 30 MG/ML IJ SOLN
INTRAMUSCULAR | Status: DC | PRN
  Administered 2013-08-24: 30 mg via INTRAVENOUS

## 2013-08-24 MED ORDER — FENTANYL CITRATE 0.05 MG/ML IJ SOLN
25.0000 ug | INTRAMUSCULAR | Status: DC | PRN
  Filled 2013-08-24: qty 1

## 2013-08-24 MED ORDER — DEXAMETHASONE SODIUM PHOSPHATE 4 MG/ML IJ SOLN
INTRAMUSCULAR | Status: DC | PRN
  Administered 2013-08-24: 10 mg via INTRAVENOUS

## 2013-08-24 MED ORDER — TAMSULOSIN HCL 0.4 MG PO CAPS
ORAL_CAPSULE | ORAL | Status: AC
  Filled 2013-08-24: qty 1

## 2013-08-24 MED ORDER — CIPROFLOXACIN IN D5W 400 MG/200ML IV SOLN
400.0000 mg | INTRAVENOUS | Status: AC
  Administered 2013-08-24: 400 mg via INTRAVENOUS
  Filled 2013-08-24: qty 200

## 2013-08-24 MED ORDER — MIDAZOLAM HCL 5 MG/5ML IJ SOLN
INTRAMUSCULAR | Status: DC | PRN
  Administered 2013-08-24: 2 mg via INTRAVENOUS

## 2013-08-24 MED ORDER — LACTATED RINGERS IV SOLN
INTRAVENOUS | Status: DC
  Filled 2013-08-24: qty 1000

## 2013-08-24 MED ORDER — KCL IN DEXTROSE-NACL 20-5-0.45 MEQ/L-%-% IV SOLN
INTRAVENOUS | Status: DC
  Administered 2013-08-24 – 2013-08-25 (×2): via INTRAVENOUS
  Filled 2013-08-24: qty 1000

## 2013-08-24 MED ORDER — PROMETHAZINE HCL 25 MG/ML IJ SOLN
6.2500 mg | INTRAMUSCULAR | Status: DC | PRN
  Filled 2013-08-24: qty 1

## 2013-08-24 MED ORDER — HYDROCODONE-ACETAMINOPHEN 5-325 MG PO TABS
1.0000 | ORAL_TABLET | ORAL | Status: DC | PRN
  Filled 2013-08-24: qty 1

## 2013-08-24 MED ORDER — FENTANYL CITRATE 0.05 MG/ML IJ SOLN
INTRAMUSCULAR | Status: DC | PRN
  Administered 2013-08-24: 50 ug via INTRAVENOUS
  Administered 2013-08-24: 12.5 ug via INTRAVENOUS
  Administered 2013-08-24 (×3): 25 ug via INTRAVENOUS
  Administered 2013-08-24 (×5): 12.5 ug via INTRAVENOUS

## 2013-08-24 MED ORDER — CIPROFLOXACIN IN D5W 400 MG/200ML IV SOLN
INTRAVENOUS | Status: AC
  Filled 2013-08-24: qty 200

## 2013-08-24 MED ORDER — LIDOCAINE HCL 2 % EX GEL
CUTANEOUS | Status: DC | PRN
  Administered 2013-08-24: 1

## 2013-08-24 MED ORDER — TAMSULOSIN HCL 0.4 MG PO CAPS
0.4000 mg | ORAL_CAPSULE | Freq: Every day | ORAL | Status: DC
  Administered 2013-08-24: 0.4 mg via ORAL
  Filled 2013-08-24: qty 1

## 2013-08-24 MED ORDER — LEVETIRACETAM 500 MG PO TABS
500.0000 mg | ORAL_TABLET | Freq: Two times a day (BID) | ORAL | Status: DC
  Administered 2013-08-24: 500 mg via ORAL
  Filled 2013-08-24: qty 1

## 2013-08-24 MED ORDER — ONDANSETRON HCL 4 MG/2ML IJ SOLN
INTRAMUSCULAR | Status: DC | PRN
  Administered 2013-08-24: 4 mg via INTRAVENOUS

## 2013-08-24 MED ORDER — MEPERIDINE HCL 25 MG/ML IJ SOLN
6.2500 mg | INTRAMUSCULAR | Status: DC | PRN
  Filled 2013-08-24: qty 1

## 2013-08-24 MED ORDER — SODIUM CHLORIDE 0.9 % IR SOLN
Status: DC | PRN
  Administered 2013-08-24: 20000 mL

## 2013-08-24 MED ORDER — LIDOCAINE HCL (CARDIAC) 20 MG/ML IV SOLN
INTRAVENOUS | Status: DC | PRN
  Administered 2013-08-24: 80 mg via INTRAVENOUS

## 2013-08-24 MED ORDER — PROPOFOL 10 MG/ML IV BOLUS
INTRAVENOUS | Status: DC | PRN
  Administered 2013-08-24: 300 mg via INTRAVENOUS

## 2013-08-24 MED ORDER — MIDAZOLAM HCL 2 MG/2ML IJ SOLN
INTRAMUSCULAR | Status: AC
  Filled 2013-08-24: qty 2

## 2013-08-24 MED ORDER — FENTANYL CITRATE 0.05 MG/ML IJ SOLN
INTRAMUSCULAR | Status: AC
  Filled 2013-08-24: qty 4

## 2013-08-24 MED ORDER — LACTATED RINGERS IV SOLN
INTRAVENOUS | Status: DC
  Administered 2013-08-24: 08:00:00 via INTRAVENOUS
  Filled 2013-08-24: qty 1000

## 2013-08-24 MED ORDER — CIPROFLOXACIN IN D5W 400 MG/200ML IV SOLN
400.0000 mg | Freq: Two times a day (BID) | INTRAVENOUS | Status: DC
  Administered 2013-08-24: 400 mg via INTRAVENOUS
  Filled 2013-08-24: qty 200

## 2013-08-24 MED ORDER — OXYBUTYNIN CHLORIDE 5 MG PO TABS
5.0000 mg | ORAL_TABLET | Freq: Three times a day (TID) | ORAL | Status: DC | PRN
  Filled 2013-08-24: qty 1

## 2013-08-24 MED ORDER — ONDANSETRON HCL 4 MG/2ML IJ SOLN
4.0000 mg | INTRAMUSCULAR | Status: DC | PRN
  Filled 2013-08-24: qty 2

## 2013-08-24 MED ORDER — MORPHINE SULFATE 2 MG/ML IJ SOLN
2.0000 mg | INTRAMUSCULAR | Status: DC | PRN
  Filled 2013-08-24: qty 2

## 2013-08-24 SURGICAL SUPPLY — 32 items
BAG DRAIN URO-CYSTO SKYTR STRL (DRAIN) ×2 IMPLANT
BAG DRN ANRFLXCHMBR STRAP LEK (BAG)
BAG DRN UROCATH (DRAIN) ×1
BAG URINE DRAINAGE (UROLOGICAL SUPPLIES) IMPLANT
BAG URINE LEG 19OZ MD ST LTX (BAG) IMPLANT
CANISTER SUCT LVC 12 LTR MEDI- (MISCELLANEOUS) ×2 IMPLANT
CATH FOLEY 2WAY SLVR  5CC 20FR (CATHETERS)
CATH FOLEY 2WAY SLVR  5CC 22FR (CATHETERS)
CATH FOLEY 2WAY SLVR 5CC 20FR (CATHETERS) IMPLANT
CATH FOLEY 2WAY SLVR 5CC 22FR (CATHETERS) IMPLANT
CATH FOLEY 3WAY 30CC 22F (CATHETERS) IMPLANT
CLOTH BEACON ORANGE TIMEOUT ST (SAFETY) ×2 IMPLANT
DRAPE CAMERA CLOSED 9X96 (DRAPES) ×2 IMPLANT
ELECT BUTTON HF 24-28F 2 30DE (ELECTRODE) ×2 IMPLANT
ELECT LOOP MED HF 24F 12D CBL (CLIP) ×1 IMPLANT
ELECT REM PT RETURN 9FT ADLT (ELECTROSURGICAL) ×2
ELECT RESECT VAPORIZE 12D CBL (ELECTRODE) IMPLANT
ELECTRODE REM PT RTRN 9FT ADLT (ELECTROSURGICAL) ×1 IMPLANT
EVACUATOR MICROVAS BLADDER (UROLOGICAL SUPPLIES) IMPLANT
GLOVE BIO SURGEON STRL SZ7 (GLOVE) ×1 IMPLANT
GLOVE BIO SURGEON STRL SZ7.5 (GLOVE) ×2 IMPLANT
GLOVE BIOGEL PI IND STRL 7.0 (GLOVE) IMPLANT
GLOVE BIOGEL PI IND STRL 7.5 (GLOVE) IMPLANT
GLOVE BIOGEL PI INDICATOR 7.0 (GLOVE) ×1
GLOVE BIOGEL PI INDICATOR 7.5 (GLOVE) ×1
GOWN STRL REIN XL XLG (GOWN DISPOSABLE) ×2 IMPLANT
HOLDER FOLEY CATH W/STRAP (MISCELLANEOUS) IMPLANT
KIT ASPIRATION TUBING (SET/KITS/TRAYS/PACK) IMPLANT
PACK CYSTOSCOPY (CUSTOM PROCEDURE TRAY) ×2 IMPLANT
PLUG CATH AND CAP STER (CATHETERS) IMPLANT
SET ASPIRATION TUBING (TUBING) ×1 IMPLANT
SYRINGE IRR TOOMEY STRL 70CC (SYRINGE) IMPLANT

## 2013-08-24 NOTE — Anesthesia Preprocedure Evaluation (Signed)
Anesthesia Evaluation  Patient identified by MRN, date of birth, ID band Patient awake    Reviewed: Allergy & Precautions, H&P , NPO status , Patient's Chart, lab work & pertinent test results  Airway Mallampati: II TM Distance: >3 FB Neck ROM: Full    Dental no notable dental hx.    Pulmonary sleep apnea ,  breath sounds clear to auscultation  Pulmonary exam normal       Cardiovascular negative cardio ROS  Rhythm:Regular Rate:Normal     Neuro/Psych Seizures -, Well Controlled,  negative psych ROS   GI/Hepatic negative GI ROS, Neg liver ROS,   Endo/Other  negative endocrine ROS  Renal/GU negative Renal ROS  negative genitourinary   Musculoskeletal negative musculoskeletal ROS (+)   Abdominal   Peds negative pediatric ROS (+)  Hematology negative hematology ROS (+)   Anesthesia Other Findings   Reproductive/Obstetrics negative OB ROS                           Anesthesia Physical  Anesthesia Plan  ASA: II and emergent  Anesthesia Plan: General   Post-op Pain Management:    Induction: Intravenous  Airway Management Planned: LMA  Additional Equipment:   Intra-op Plan:   Post-operative Plan:   Informed Consent: I have reviewed the patients History and Physical, chart, labs and discussed the procedure including the risks, benefits and alternatives for the proposed anesthesia with the patient or authorized representative who has indicated his/her understanding and acceptance.   Dental advisory given  Plan Discussed with: CRNA  Anesthesia Plan Comments:         Anesthesia Quick Evaluation

## 2013-08-24 NOTE — Op Note (Signed)
Preoperative diagnosis: BPH Postoperative diagnosis: Same  Procedure: Cystoscopy with gyrus TURP   Surgeon: Valetta Fuller M.D.  Anesthesia: Gen.  Indications: Patient is 47 years of age and is known to have marked BPH with a very large middle lobe and ongoing issues with voiding dysfunction and a prior history of significant gross hematuria and elevated PSA. He has been on a combination of Flomax and finasteride. He has not had any recent problems with recurrent gross hematuria. He wants to get off medical therapy and we did discuss with him the option of a gyrus TURP to primarily resect is very enlarged middle lobe. We discussed the pros and cons of this approach and he elected to proceed with surgical intervention.      Technique and findings: Patient was brought the operating room where he had successful induction of general anesthesia. Placed in lithotomy position and prepped and draped in usual manner. The patient received perioperative antibiotics. Appropriate surgical timeout was performed. Endoscopic assessment again revealed marked trilobar hyperplasia with visual traction. The patient had a very impressively large middle lobe.  The ureteral orifices were identified bilaterally. The middle lobe was resected down to the capsular fibers of the bladder neck. The gyrus instrumentation with saline as an irrigant was utilized. At completion of resection the ureteral orifices were again identified and were uninjured. We continued the resection from the middle lobe out distally to the verumontanum the prostate. We elected not to resect his lateral lobes did not appear to be significantly obstructing and it certainly. Visually the middle lobe was primarily responsible for this gentleman to ongoing outlet obstruction. We felt resection of the lateral of tissue which is increased risk potentially of increased bleeding and/or erectile dysfunction. At completion of the procedure hemostasis was good. Urine  was light pink in color. A 24 French three-way Foley catheter was placed and continuous bladder gauge with normal saline was commenced.

## 2013-08-24 NOTE — OR Nursing (Signed)
Dr. Acey Lav made aware of BP. No orders received. Will continue to monitor.

## 2013-08-24 NOTE — Interval H&P Note (Signed)
History and Physical Interval Note:  08/24/2013 8:40 AM  Francis Jackson  has presented today for surgery, with the diagnosis of BENIGN PROSTATIC HYPERTROPHY  The various methods of treatment have been discussed with the patient and family. After consideration of risks, benefits and other options for treatment, the patient has consented to  Procedure(s): TRANSURETHRAL RESECTION OF THE PROSTATE WITH GYRUS INSTRUMENTS (N/A) as a surgical intervention .  The patient's history has been reviewed, patient examined, no change in status, stable for surgery.  I have reviewed the patient's chart and labs.  Questions were answered to the patient's satisfaction.     [name]

## 2013-08-24 NOTE — Anesthesia Postprocedure Evaluation (Signed)
  Anesthesia Post-op Note  Patient: Francis Jackson  Procedure(s) Performed: Procedure(s) (LRB): TRANSURETHRAL RESECTION OF THE PROSTATE WITH GYRUS INSTRUMENTS (N/A)  Patient Location: PACU  Anesthesia Type: General  Level of Consciousness: awake and alert   Airway and Oxygen Therapy: Patient Spontanous Breathing  Post-op Pain: mild  Post-op Assessment: Post-op Vital signs reviewed, Patient's Cardiovascular Status Stable, Respiratory Function Stable, Patent Airway and No signs of Nausea or vomiting  Last Vitals:  Filed Vitals:   08/24/13 1134  BP: 196/121  Pulse: 62  Temp: 36.3 C  Resp: 14    Post-op Vital Signs: stable. HTN   Complications: No apparent anesthesia complications. Pre-existing HTN, untreated. Needs to see primary care for treatment

## 2013-08-24 NOTE — Anesthesia Procedure Notes (Signed)
Procedure Name: LMA Insertion Date/Time: 08/24/2013 8:52 AM Performed by: Maris Berger T Pre-anesthesia Checklist: Patient identified, Emergency Drugs available, Suction available and Patient being monitored Patient Re-evaluated:Patient Re-evaluated prior to inductionOxygen Delivery Method: Circle System Utilized Preoxygenation: Pre-oxygenation with 100% oxygen Intubation Type: IV induction Ventilation: Mask ventilation without difficulty LMA: LMA inserted LMA Size: 5.0 Number of attempts: 1 Airway Equipment and Method: bite block Placement Confirmation: positive ETCO2 Dental Injury: Teeth and Oropharynx as per pre-operative assessment

## 2013-08-24 NOTE — Transfer of Care (Signed)
Immediate Anesthesia Transfer of Care Note  Patient: Francis Jackson  Procedure(s) Performed: Procedure(s) (LRB): TRANSURETHRAL RESECTION OF THE PROSTATE WITH GYRUS INSTRUMENTS (N/A)  Patient Location: PACU  Anesthesia Type: General  Level of Consciousness: awake, sedated, patient cooperative and responds to stimulation  Airway & Oxygen Therapy: Patient Spontanous Breathing and Patient connected to face mask oxygen  Post-op Assessment: Report given to PACU RN, Post -op Vital signs reviewed and stable and Patient moving all extremities  Post vital signs: Reviewed and stable  Complications: No apparent anesthesia complications

## 2013-08-25 ENCOUNTER — Encounter (HOSPITAL_BASED_OUTPATIENT_CLINIC_OR_DEPARTMENT_OTHER): Payer: Self-pay | Admitting: Urology

## 2013-08-25 LAB — HEMOGLOBIN AND HEMATOCRIT, BLOOD
HCT: 44.2 % (ref 39.0–52.0)
Hemoglobin: 15.1 g/dL (ref 13.0–17.0)

## 2013-08-25 LAB — BASIC METABOLIC PANEL
Calcium: 9.1 mg/dL (ref 8.4–10.5)
Chloride: 101 mEq/L (ref 96–112)
GFR calc non Af Amer: 82 mL/min — ABNORMAL LOW (ref >90)
Glucose, Bld: 132 mg/dL — ABNORMAL HIGH (ref 70–99)
Potassium: 4.4 mEq/L (ref 3.5–5.1)
Sodium: 137 mEq/L (ref 135–145)

## 2013-08-25 MED ORDER — HYDROCODONE-ACETAMINOPHEN 5-325 MG PO TABS: 1.0000 | ORAL_TABLET | Freq: Four times a day (QID) | ORAL | Status: DC | PRN

## 2013-08-25 MED ORDER — CIPROFLOXACIN HCL 500 MG PO TABS: 500.0000 mg | ORAL_TABLET | Freq: Two times a day (BID) | ORAL | Status: DC

## 2013-08-25 NOTE — Discharge Summary (Signed)
Patient ID: Francis Jackson MRN: [id number] DOB/AGE: [birth year] 47 y.o.  Admit date: 08/24/2013 Discharge date: 08/25/2013    Discharge Diagnoses:   Present on Admission:  . BPH (benign prostatic hyperplasia)  Consults:  None    Discharge Medications:   Medication List         ciprofloxacin 500 MG tablet  Commonly known as:  CIPRO  Take 1 tablet (500 mg total) by mouth 2 (two) times daily.     HYDROcodone-acetaminophen 5-325 MG per tablet  Commonly known as:  NORCO/VICODIN  Take 1 tablet by mouth every 4 (four) hours as needed for pain.     HYDROcodone-acetaminophen 5-325 MG per tablet  Commonly known as:  NORCO/VICODIN  Take 1-2 tablets by mouth every 6 (six) hours as needed.     levETIRAcetam 500 MG tablet  Commonly known as:  KEPPRA  Take 1 tablet (500 mg total) by mouth 2 (two) times daily.     naproxen 500 MG tablet  Commonly known as:  NAPROSYN  Take 500 mg by mouth 2 (two) times daily as needed (pain).     tamsulosin 0.4 MG Caps capsule  Commonly known as:  FLOMAX  Take 1 capsule (0.4 mg total) by mouth daily.         Significant Diagnostic Studies:  No results found.    Hospital Course:  Active Problems:   BPH with obstruction/lower urinary tract symptoms   BPH (benign prostatic hyperplasia)  Patient underwent uneventful TURP. He was kept overnight for observation. No obvious complications or problems. In the morning his urine was clear. Vital signs are acceptable and lab work was also without concern. The patient will be discharged with an indwelling catheter. Day of Discharge BP 151/93  Pulse 75  Temp(Src) 97.6 F (36.4 C) (Oral)  Resp 18  Ht 5\' 8"  (1.727 m)  Wt 179 lb (81.194 kg)  BMI 27.22 kg/m2  SpO2 99%  Well-developed well-nourished male in no acute distress. Normal respiratory effort. Abdomen soft. Catheter draining well. Extremities without edema or tenderness.  Results for orders placed during the hospital encounter of 08/24/13  (from the past 24 hour(s))  POCT HEMOGLOBIN-HEMACUE     Status: None   Collection Time    08/24/13  8:10 AM      Result Value Range   Hemoglobin 16.3  13.0 - 17.0 g/dL  BASIC METABOLIC PANEL     Status: Abnormal   Collection Time    08/25/13  6:59 AM      Result Value Range   Sodium 137  135 - 145 mEq/L   Potassium 4.4  3.5 - 5.1 mEq/L   Chloride 101  96 - 112 mEq/L   CO2 27  19 - 32 mEq/L   Glucose, Bld 132 (*) 70 - 99 mg/dL   BUN 12  6 - 23 mg/dL   Creatinine, Ser 4.09  0.50 - 1.35 mg/dL   Calcium 9.1  8.4 - 81.1 mg/dL   GFR calc non Af Amer 82 (*) >90 mL/min   GFR calc Af Amer >90  >90 mL/min  HEMOGLOBIN AND HEMATOCRIT, BLOOD     Status: None   Collection Time    08/25/13  6:59 AM      Result Value Range   Hemoglobin 15.1  13.0 - 17.0 g/dL   HCT 91.4  78.2 - 95.6 %

## 2013-08-28 ENCOUNTER — Other Ambulatory Visit: Payer: Self-pay | Admitting: Neurology

## 2013-08-28 ENCOUNTER — Telehealth: Payer: Self-pay | Admitting: Neurology

## 2013-08-28 NOTE — Telephone Encounter (Signed)
Rx has already been sent.  I spoke to the patient, he is aware.  Says he misses his appts because we do not remind him often enough that he has appts scheduled.  I recommended he write his appt date on his calendar as a reminder to himself.  He was in agreement.

## 2013-11-03 ENCOUNTER — Ambulatory Visit (INDEPENDENT_AMBULATORY_CARE_PROVIDER_SITE_OTHER): Payer: Medicaid Other | Admitting: Neurology

## 2013-11-03 ENCOUNTER — Encounter (INDEPENDENT_AMBULATORY_CARE_PROVIDER_SITE_OTHER): Payer: Self-pay

## 2013-11-03 ENCOUNTER — Encounter: Payer: Self-pay | Admitting: Neurology

## 2013-11-03 VITALS — BP 123/80 | HR 63 | Ht 68.0 in | Wt 191.0 lb

## 2013-11-03 DIAGNOSIS — R569 Unspecified convulsions: Secondary | ICD-10-CM

## 2013-11-03 DIAGNOSIS — G4733 Obstructive sleep apnea (adult) (pediatric): Secondary | ICD-10-CM | POA: Insufficient documentation

## 2013-11-03 MED ORDER — LEVETIRACETAM 500 MG PO TABS: 500.0000 mg | ORAL_TABLET | Freq: Two times a day (BID) | ORAL | Status: DC

## 2013-11-03 NOTE — Progress Notes (Signed)
PATIENT: Francis Jackson DOB: 04/20/1966  HISTORICAL  RIAN BUSCHE is all 48 years old right-handed [place] American male, native of [place], return to clinic for followup seizure, last clinical visit was May 2013 with Francis Jackson  He began to have seizure since August 05 2009, it was nocturnal seizure, generalized tonic-clonic lasting about 10-15 minutes.  Evaluation showed normal MRI of the brain, EEG, laboratory showed normal CBC, CMP, UA, negative UDS.  Second nocturnal seizure July 2011, third seizure in February 2012, he was visiting his friend with his wife, he began to circling around, confused, and then went to tonic-clonic , he was started on Keppra 500 mg twice a day in 2012,  4 seizure was in June 2013, while he was not compliant with his Keppra, he was taken to emergency room, had multiple abrasions  to his face, and right eye, CAT scan of the brain and neck showed no significant abnormality.  Fifths seizure in May 2014, again happened when he was not compliant with his Keppra. Proceeding by rising sensation, nauseous,  He also complains of symptoms consistent with obstructive sleep apnea, had sleep study in January 2011, at [facility], by Dr. Kimberlee Nearing, there was evidence of mild obstructive sleep apnea, lowest SpO2 was 82%, he was given advise of losing weight, sleep on his side, he continued to have frequent snoring, wife has to shake him to stop snoring, start breathing almost every night,  REVIEW OF SYSTEMS: Full 14 system review of systems performed and notable only for fatigue, achy muscles, running nose,  ALLERGIES: No Known Allergies  HOME MEDICATIONS: Current Outpatient Prescriptions on File Prior to Visit  Medication Sig Dispense Refill  . ciprofloxacin (CIPRO) 500 MG tablet Take 1 tablet (500 mg total) by mouth 2 (two) times daily.  10 tablet  0  . HYDROcodone-acetaminophen (NORCO/VICODIN) 5-325 MG per tablet Take 1 tablet by mouth every 4 (four) hours as needed for  pain.  20 tablet  0  . HYDROcodone-acetaminophen (NORCO/VICODIN) 5-325 MG per tablet Take 1-2 tablets by mouth every 6 (six) hours as needed.  20 tablet  0  . levETIRAcetam (KEPPRA) 500 MG tablet TAKE 1 TABLET (500 MG TOTAL) BY MOUTH 2 (TWO) TIMES DAILY.  60 tablet  2  . naproxen (NAPROSYN) 500 MG tablet Take 500 mg by mouth 2 (two) times daily as needed (pain).       . Tamsulosin HCl (FLOMAX) 0.4 MG CAPS Take 1 capsule (0.4 mg total) by mouth daily.  10 capsule  0   No current facility-administered medications on file prior to visit.    PAST MEDICAL HISTORY: Past Medical History  Diagnosis Date  . Migraine   . Seizure disorder, grand mal     PER PT LAST SEIZURE 01/2013  . BPH (benign prostatic hypertrophy)   . History of urinary retention     12/2011  . History of kidney stones   . Mild obstructive sleep apnea     PER STUDY 09-14-2009 NO CPAP RX  . Seizures     PAST SURGICAL HISTORY: Past Surgical History  Procedure Laterality Date  . Shoulder surgery Left 2009  . Excision lipoma of scalp and face  03-24-2005  . Cysto/  right retrograde pyelogram/  ureteroscopy  12-23-2011  . Transurethral resection of prostate N/A 08/24/2013    Procedure: TRANSURETHRAL RESECTION OF THE PROSTATE WITH GYRUS INSTRUMENTS;  Surgeon: Bernestine Amass, MD;  Location: [facility];  Service: Urology;  Laterality: N/A;  FAMILY HISTORY: History reviewed. No pertinent family history.  SOCIAL HISTORY:  History   Social History  . Marital Status: Married    Spouse Name: N/A    Number of Children: 4  . Years of Education: 12   Occupational History  .      Taxi driver   Social History Main Topics  . Smoking status: Never Smoker   . Smokeless tobacco: Never Used  . Alcohol Use: No  . Drug Use: No  . Sexual Activity: Not on file   Other Topics Concern  . Not on file   Social History Narrative   Patient lives at home with his wife Francis Jackson)   Patient works as a Restaurant manager, fast food part time.   Right handed   Caffeine coffee sometimes     PHYSICAL EXAM   Filed Vitals:   11/03/13 1336  BP: 123/80  Pulse: 63  Height: 5\' 8"  (1.727 m)  Weight: 191 lb (86.637 kg)    Not recorded    Body mass index is 29.05 kg/(m^2).   Generalized: In no acute distress  Neck: Supple, no carotid bruits   Cardiac: Regular rate rhythm  Pulmonary: Clear to auscultation bilaterally  Musculoskeletal: No deformity  Neurological examination  Mentation: Alert oriented to time, place, history taking, and causual conversation  Cranial nerve II-XII: Pupils were equal round reactive to light. Extraocular movements were full.  Visual field were full on confrontational test. Bilateral fundi were sharp.  Facial sensation and strength were normal. Hearing was intact to finger rubbing bilaterally. Uvula tongue midline.  Head turning and shoulder shrug and were normal and symmetric.Tongue protrusion into cheek strength was normal.  Motor: Normal tone, bulk and strength.  Sensory: Intact to fine touch, pinprick, preserved vibratory sensation, and proprioception at toes.  Coordination: Normal finger to nose, heel-to-shin bilaterally there was no truncal ataxia  Gait: Rising up from seated position without assistance, normal stance, without trunk ataxia, moderate stride, good arm swing, smooth turning, able to perform tiptoe, and heel walking without difficulty.   Romberg signs: Negative  Deep tendon reflexes: Brachioradialis 2/2, biceps 2/2, triceps 2/2, patellar 2/2, Achilles 2/2, plantar responses were flexor bilaterally.   DIAGNOSTIC DATA (LABS, IMAGING, TESTING) - I reviewed patient records, labs, notes, testing and imaging myself where available.  Lab Results  Component Value Date   WBC 4.4 03/05/2013   HGB 15.1 08/25/2013   HCT 44.2 08/25/2013   MCV 78.3 03/05/2013   PLT 271 03/05/2013      Component Value Date/Time   NA 137 08/25/2013 0659   K 4.4 08/25/2013 0659    CL 101 08/25/2013 0659   CO2 27 08/25/2013 0659   GLUCOSE 132* 08/25/2013 0659   BUN 12 08/25/2013 0659   CREATININE 1.06 08/25/2013 0659   CALCIUM 9.1 08/25/2013 0659   PROT 7.8 02/12/2012 1726   ALBUMIN 4.3 02/12/2012 1726   AST 27 02/12/2012 1726   ALT 24 02/12/2012 1726   ALKPHOS 63 02/12/2012 1726   BILITOT 0.3 02/12/2012 1726   GFRNONAA 82* 08/25/2013 0659   GFRAA >90 08/25/2013 0659   ASSESSMENT AND PLAN  Francis Jackson is a 48 y.o. male complains of complex partial seizure with secondary generalization, symptoms consistent with obstructive sleep apnea   1.  sleep study, CPAP titration 2.  I have emphasized with him the importance of complying with his epileptic medications, Keppra 500 mg twice a day 3.  return to clinic in one year with Francis Jackson  Marcial Pacas, M.D. Ph.D.  [facility] [address] [place], [place] [address] [phone]

## 2013-11-06 ENCOUNTER — Telehealth: Payer: Self-pay | Admitting: Neurology

## 2013-11-06 NOTE — Telephone Encounter (Signed)
Dr. Jetty Duhamel patient for attended sleep study. Dr. [name] has placed order for Split Night in Epic.  Height: 5'8  Weight: 191 lbs.  BMI: 29.05  Past Medical History:  Migraine  Seizure disorder, grand mal  PER PT LAST SEIZURE 01/2013  BPH (benign prostatic hypertrophy)  History of urinary retention  12/2011  History of kidney stones  Mild obstructive sleep apnea  PER STUDY 09-14-2009 NO CPAP RX  Seizures    Sleep Symptoms:  He also complains of symptoms consistent with obstructive sleep apnea, had sleep study in January 2011, at [facility], by Dr. Kimberlee Nearing, there was evidence of mild obstructive sleep apnea, lowest SpO2 was 82%, he was given advise of losing weight, sleep on his side, he continued to have frequent snoring, wife has to shake him to stop snoring, start breathing almost every night,   Epworth Score:I called and spoke with the patient to get his Epworth score. Score was 0  Medications:  Ciprofloxacin HCl (Tab) CIPRO 500 MG Take 1 tablet (500 mg total) by mouth 2 (two) times daily. Hydrocodone-Acetaminophen (Tab) NORCO/VICODIN 5-325 MG Take 1 tablet by mouth every 4 (four) hours as needed for pain. Hydrocodone-Acetaminophen (Tab) NORCO/VICODIN 5-325 MG Take 1-2 tablets by mouth every 6 (six) hours as needed. LevETIRAcetam (Tab) KEPPRA 500 MG TAKE 1 TABLET (500 MG TOTAL) BY MOUTH 2 (TWO) TIMES DAILY. LevETIRAcetam (Tab) KEPPRA 500 MG Take 1 tablet (500 mg total) by mouth 2 (two) times daily. Naproxen (Tab) NAPROSYN 500 MG Take 500 mg by mouth 2 (two) times daily as needed (pain). Tamsulosin HCl (Cap) FLOMAX 0.4 MG Take 1 capsule (0.4 mg total) by mouth daily.    Insurance: Medicaid  Dr. Rhea Belton ASSESSMENT AND PLAN  Francis Jackson is a 48 y.o. male complains of complex partial seizure with secondary generalization, symptoms consistent with obstructive sleep apnea  1. sleep study, CPAP titration  2. I have emphasized with him the importance of complying with his epileptic  medications, Keppra 500 mg twice a day  3. return to clinic in one year with [name]      Please review patient information and submit instructions for scheduling and orders for sleep technologist.  Thank you!

## 2013-11-06 NOTE — Telephone Encounter (Signed)
Agree with SPLIT night , CO2 only if morning HA>  Score at 35 AHI 15 SPLIT.

## 2014-01-25 ENCOUNTER — Ambulatory Visit: Payer: Medicaid Other | Admitting: Neurology

## 2014-01-25 DIAGNOSIS — G4733 Obstructive sleep apnea (adult) (pediatric): Secondary | ICD-10-CM

## 2014-02-08 ENCOUNTER — Other Ambulatory Visit: Payer: Self-pay | Admitting: *Deleted

## 2014-02-08 ENCOUNTER — Encounter: Payer: Self-pay | Admitting: *Deleted

## 2014-02-08 ENCOUNTER — Telehealth: Payer: Self-pay | Admitting: Neurology

## 2014-02-08 DIAGNOSIS — G4733 Obstructive sleep apnea (adult) (pediatric): Secondary | ICD-10-CM

## 2014-02-08 DIAGNOSIS — R569 Unspecified convulsions: Secondary | ICD-10-CM

## 2014-02-08 NOTE — Telephone Encounter (Signed)
I called and spoke with the patient about his recent sleep study results. I informed the patient that the study revealed moderate obstructive sleep apnea and he did well on CPAP during the night of his study with improvement of his respiratory events. I will send the CPAP order to [facility], fax a copy of the report to Dr. [name], Dr. Jeanie Cooks offices. I will mail a copy of the report to the patient along with a follow up instruction letter.

## 2014-03-18 ENCOUNTER — Encounter: Payer: Self-pay | Admitting: Neurology

## 2014-03-20 ENCOUNTER — Encounter: Payer: Self-pay | Admitting: Neurology

## 2014-04-14 ENCOUNTER — Ambulatory Visit: Payer: Medicaid Other | Admitting: Neurology

## 2014-04-28 ENCOUNTER — Other Ambulatory Visit: Payer: Self-pay | Admitting: Specialist

## 2014-04-28 ENCOUNTER — Encounter: Payer: Self-pay | Admitting: Neurology

## 2014-04-28 DIAGNOSIS — M545 Low back pain: Secondary | ICD-10-CM

## 2014-05-07 ENCOUNTER — Encounter: Payer: Self-pay | Admitting: Neurology

## 2014-05-07 ENCOUNTER — Ambulatory Visit (INDEPENDENT_AMBULATORY_CARE_PROVIDER_SITE_OTHER): Payer: Medicaid Other | Admitting: Neurology

## 2014-05-07 VITALS — BP 137/89 | HR 65 | Resp 16 | Ht 68.5 in | Wt 189.0 lb

## 2014-05-07 DIAGNOSIS — G4733 Obstructive sleep apnea (adult) (pediatric): Secondary | ICD-10-CM | POA: Insufficient documentation

## 2014-05-07 DIAGNOSIS — Z9989 Dependence on other enabling machines and devices: Principal | ICD-10-CM

## 2014-05-07 NOTE — Patient Instructions (Signed)
Sleep Apnea  Sleep apnea is a sleep disorder characterized by abnormal pauses in breathing while you sleep. When your breathing pauses, the level of oxygen in your blood decreases. This causes you to move out of deep sleep and into light sleep. As a result, your quality of sleep is poor, and the system that carries your blood throughout your body (cardiovascular system) experiences stress. If sleep apnea remains untreated, the following conditions can develop:  High blood pressure (hypertension).  Coronary artery disease.  Inability to achieve or maintain an erection (impotence).  Impairment of your thought process (cognitive dysfunction). There are three types of sleep apnea: 1. Obstructive sleep apnea--Pauses in breathing during sleep because of a blocked airway. 2. Central sleep apnea--Pauses in breathing during sleep because the area of the brain that controls your breathing does not send the correct signals to the muscles that control breathing. 3. Mixed sleep apnea--A combination of both obstructive and central sleep apnea. RISK FACTORS The following risk factors can increase your risk of developing sleep apnea:  Being overweight.  Smoking.  Having narrow passages in your nose and throat.  Being of older age.  Being male.  Alcohol use.  Sedative and tranquilizer use.  Ethnicity. Among individuals younger than 35 years, African Americans are at increased risk of sleep apnea. SYMPTOMS   Difficulty staying asleep.  Daytime sleepiness and fatigue.  Loss of energy.  Irritability.  Loud, heavy snoring.  Morning headaches.  Trouble concentrating.  Forgetfulness.  Decreased interest in sex. DIAGNOSIS  In order to diagnose sleep apnea, your caregiver will perform a physical examination. Your caregiver may suggest that you take a home sleep test. Your caregiver may also recommend that you spend the night in a sleep lab. In the sleep lab, several monitors record  information about your heart, lungs, and brain while you sleep. Your leg and arm movements and blood oxygen level are also recorded. TREATMENT The following actions may help to resolve mild sleep apnea:  Sleeping on your side.   Using a decongestant if you have nasal congestion.   Avoiding the use of depressants, including alcohol, sedatives, and narcotics.   Losing weight and modifying your diet if you are overweight. There also are devices and treatments to help open your airway:  Oral appliances. These are custom-made mouthpieces that shift your lower jaw forward and slightly open your bite. This opens your airway.  Devices that create positive airway pressure. This positive pressure "splints" your airway open to help you breathe better during sleep. The following devices create positive airway pressure:  Continuous positive airway pressure (CPAP) device. The CPAP device creates a continuous level of air pressure with an air pump. The air is delivered to your airway through a mask while you sleep. This continuous pressure keeps your airway open.  Nasal expiratory positive airway pressure (EPAP) device. The EPAP device creates positive air pressure as you exhale. The device consists of single-use valves, which are inserted into each nostril and held in place by adhesive. The valves create very little resistance when you inhale but create much more resistance when you exhale. That increased resistance creates the positive airway pressure. This positive pressure while you exhale keeps your airway open, making it easier to breath when you inhale again.  Bilevel positive airway pressure (BPAP) device. The BPAP device is used mainly in patients with central sleep apnea. This device is similar to the CPAP device because it also uses an air pump to deliver continuous air pressure   through a mask. However, with the BPAP machine, the pressure is set at two different levels. The pressure when you  exhale is lower than the pressure when you inhale.  Surgery. Typically, surgery is only done if you cannot comply with less invasive treatments or if the less invasive treatments do not improve your condition. Surgery involves removing excess tissue in your airway to create a wider passage way. Document Released: 08/17/2002 Document Revised: 12/22/2012 Document Reviewed: 01/03/2012 ExitCare Patient Information 2015 [employer]. This information is not intended to replace advice given to you by your health care provider. Make sure you discuss any questions you have with your health care provider.  

## 2014-05-07 NOTE — Progress Notes (Signed)
SLEEP MEDICINE CLINIC   Provider:  Larey Jackson, M D  Referring Provider: Philis Fendt, MD Primary Care Physician:  Francis Fendt, MD  Chief Complaint  Patient presents with  . Follow-up    Room 10  . Sleep Apnea    HPI:  Francis Jackson is a 48 y.o. male  Is seen here as a referral   from Dr. [name] and Dr. Jeanie Cooks for a sleep apnea follow up, he  was referred by Dr [name] for a sleep study . See note below.  Interval history. Francis Jackson, 48 year old right-handed man presented to the sleep lab on 01-25-14. There was no depression score on sleepiness score obtained the patient did not bring his paperwork at the time.  He was diagnosed with  obstructive sleep apnea at an AHI of 17.7, RDI of 18.9 per hour of sleep.  During his REM sleep the AHI rules to 33.4 events per hour and in supine position 19.5 AHI. Loud snoring was noted according to the observing technologist.  His lowest oxygen level through the night was 76% with 27.8 minutes at or below 90%. His heart rate remained regular he did not have periodic limb movements, but when the EEG abnormalities noted. He was titrated to 7 cm water and an AHI of 0.0 was reached.  The lowest oxygen level was not 91% there were no desaturations of critical importance. The patient was set up with CPAP at 7 cm water, 1 cm EPR  and a PICO nasal mask in XL size , manufactured by [employer].  The patient received a copy of the report requesting compliance to CPAP for at least 4 hours at night.  Today on 05-06-14 I was able to review a compliance report the patient has a complaint of 86% he is using his machine on average 5 hours and 38 minutes at night, pressure is 7 cm water with 1 cm EPR his residual AHI is 6.8. He has noticed some air leaks but they do not bother him very much. The patient also reports that his bowels has noted that he snores less and that she can longer observed apnea.  His Epworth score and FSS score were low, but we have no  comparison.    Francis Jackson goes to bed at 11 PM , promptly falls asleep, but wakes spontaneously at 3 and 4 AM, for unknown reasons. He goes back to sleep. He rises at 5.30 to pray and leaves the house at 7 AM. He operates a [employer]. Overall nightly sleep time is only 5-6  Hours  He drinks non-coffeinated teas, and sweetened iced tea. He does not nap in daytime.   Last office note: Francis Jackson is all 48 years old right-handed [place] male, native of [place], living in the [place] for 17 years , he returns to clinic for followup seizure, last clinical visit was May 2013 with Cecille Rubin , [facility] He began to have seizures in August 05 2009,  His first was a nocturnal seizure, generalized tonic-clonic,  lasting about 10-15 minutes. Post seizure neurologic evaluation  showed normal MRI of the brain, EEG, laboratory showed normal CBC, CMP, UA, negative UDS. Second nocturnal seizure July 2011, third seizure in February 2012, he was visiting his friend with his wife, he began to circling around, confused, and then went to tonic-clonic , he was started on Keppra 500 mg twice a day in 2012, 4th seizure  in June 2013, while he  was not compliant with Keppra, he was taken to emergency room, had multiple abrasions to his face and right eye, CT scan -brain and neck showed no significant abnormality. Fifths seizure, May 2014, again while he was not compliant with  Keppra. Proceeding by rising sensation, nausea.,  He also complains of symptoms consistent with obstructive sleep apnea, had sleep study in January 2011, at [facility], by Dr. Annamaria Boots,  there was evidence of mild obstructive sleep apnea, lowest SpO2 was 82%, he was given advise of losing weight, sleep on his side, he continued to have frequent snoring, wife has to shake him to stop snoring, start breathing almost every night,  Review of Systems: Out of a complete 14 system review, the patient complains of only the following symptoms, and  all other reviewed systems are negative. No nausea, headahes,no dry mouth, no leg cramps, no diplopia, no nocturia. Marland Kitchen   Epworth score 3  , Fatigue severity score 16  , depression score GDS 2.   History   Social History  . Marital Status: Married    Spouse Name: N/A    Number of Children: 4  . Years of Education: 12   Occupational History  .      Taxi driver   Social History Main Topics  . Smoking status: Never Smoker   . Smokeless tobacco: Never Used  . Alcohol Use: No  . Drug Use: No  . Sexual Activity: Not on file   Other Topics Concern  . Not on file   Social History Narrative   Patient lives at home with his wife Sueanne Margarita)   Patient works as a Architect part time.   Right handed   Patient drinks one cup of tea per week.   Patient has four children.    History reviewed. No pertinent family history.  Past Medical History  Diagnosis Date  . Migraine   . Seizure disorder, grand mal     PER PT LAST SEIZURE 01/2013  . BPH (benign prostatic hypertrophy)   . History of urinary retention     12/2011  . History of kidney stones   . Mild obstructive sleep apnea     PER STUDY 09-14-2009 NO CPAP RX  . Seizures     Past Surgical History  Procedure Laterality Date  . Shoulder surgery Left 2009  . Excision lipoma of scalp and face  03-24-2005  . Cysto/  right retrograde pyelogram/  ureteroscopy  12-23-2011  . Transurethral resection of prostate N/A 08/24/2013    Procedure: TRANSURETHRAL RESECTION OF THE PROSTATE WITH GYRUS INSTRUMENTS;  Surgeon: Bernestine Amass, MD;  Location: [facility];  Service: Urology;  Laterality: N/A;    Current Outpatient Prescriptions  Medication Sig Dispense Refill  . levETIRAcetam (KEPPRA) 500 MG tablet Take 1 tablet (500 mg total) by mouth 2 (two) times daily.  60 tablet  12  . naproxen (NAPROSYN) 500 MG tablet Take 500 mg by mouth 2 (two) times daily as needed (pain).        No current facility-administered  medications for this visit.    Allergies as of 05/07/2014  . (No Known Allergies)    Vitals: BP 137/89  Pulse 65  Resp 16  Ht 5' 8.5" (1.74 m)  Wt 189 lb (85.73 kg)  BMI 28.32 kg/m2 Last Weight:  Wt Readings from Last 1 Encounters:  05/07/14 189 lb (85.73 kg)       Last Height:   Ht Readings from Last 1  Encounters:  05/07/14 5' 8.5" (1.74 m)    Physical exam:  General: The patient is awake, alert and appears not in acute distress.  The patient is well groomed. Head: Normocephalic, atraumatic.  Neck is supple. Mallampati  4 high grade,  neck circumference:18.5 . Nasal airflow unrestrcicted , TMJ is  not evident . Retrognathia is not seen.  Cardiovascular:  Regular rate and rhythm , without  murmurs or carotid bruit, and without distended neck veins. Respiratory: Lungs are clear to auscultation. Skin:  Without evidence of edema, or rash Trunk: BMI is  elevated and patient  has normal posture.  Neurologic exam : The patient is awake and alert, oriented to place and time.   Memory subjective described as intact. There is a normal attention span & concentration ability.  Speech is fluent without  dysarthria, dysphonia or aphasia. Mood and affect are appropriate.  Cranial nerves: Pupils are equal and briskly reactive to light. Funduscopic exam without  evidence of pallor or edema. Extraocular movements  in vertical and horizontal planes intact and without nystagmus. Visual fields by finger perimetry are intact. Hearing to finger rub intact.  Facial sensation intact to fine touch. Facial motor strength is symmetric and tongue and uvula move midline.  Motor exam:   Normal tone, muscle bulk and symmetric strength in all extremities.  Sensory:  Fine touch, pinprick and vibration were tested in all extremities.  Proprioception is tested in the upper extremities only. This was normal.  Coordination: Rapid alternating movements in the fingers/hands is normal.  Finger-to-nose  maneuver  normal without evidence of ataxia, dysmetria or tremor.  Gait and station: Patient walks without assistive device.  Deep tendon reflexes: in the  upper and lower extremities are symmetric and intact. Babinski maneuver response is downgoing.   Assessment:  After physical and neurologic examination, review of laboratory studies, imaging, neurophysiology testing and pre-existing records, assessment is:  OSA, moderate degree , well controlled on CPAP at current pressure, patient is compliant. The patient was advised of the nature of the diagnosed sleep disorder , the treatment options and risks for general a health and wellness arising from not treating the condition. Visit duration was 30 minutes.   Plan:  Treatment plan and additional workup :  Continue CPAP use.  Will follow yearly CPAP in sleep clinic, please mark the RV as "sleep clinic" or "with CPAP", thank you.       Asencion Partridge Shaniya Tashiro MD  05/07/2014

## 2014-05-10 ENCOUNTER — Ambulatory Visit
Admission: RE | Admit: 2014-05-10 | Discharge: 2014-05-10 | Disposition: A | Discharge status: Home / Self Care | Payer: Medicaid Other | Source: Ambulatory Visit | Attending: Specialist | Admitting: Specialist

## 2014-05-10 DIAGNOSIS — M545 Low back pain: Secondary | ICD-10-CM

## 2014-07-10 ENCOUNTER — Emergency Department (HOSPITAL_COMMUNITY)
Admission: EM | Admit: 2014-07-10 | Discharge: 2014-07-11 | Disposition: A | Discharge status: Home / Self Care | Payer: Medicaid Other | Attending: Emergency Medicine | Admitting: Emergency Medicine

## 2014-07-10 ENCOUNTER — Encounter (HOSPITAL_COMMUNITY): Payer: Self-pay | Admitting: Emergency Medicine

## 2014-07-10 DIAGNOSIS — Z87442 Personal history of urinary calculi: Secondary | ICD-10-CM | POA: Insufficient documentation

## 2014-07-10 DIAGNOSIS — Z8679 Personal history of other diseases of the circulatory system: Secondary | ICD-10-CM | POA: Insufficient documentation

## 2014-07-10 DIAGNOSIS — G40909 Epilepsy, unspecified, not intractable, without status epilepticus: Secondary | ICD-10-CM | POA: Insufficient documentation

## 2014-07-10 DIAGNOSIS — Z79899 Other long term (current) drug therapy: Secondary | ICD-10-CM | POA: Diagnosis not present

## 2014-07-10 DIAGNOSIS — Z87448 Personal history of other diseases of urinary system: Secondary | ICD-10-CM | POA: Diagnosis not present

## 2014-07-10 DIAGNOSIS — R569 Unspecified convulsions: Secondary | ICD-10-CM

## 2014-07-10 LAB — CBC
HEMATOCRIT: 43.9 % (ref 39.0–52.0)
HEMOGLOBIN: 14.5 g/dL (ref 13.0–17.0)
MCH: 26.1 pg (ref 26.0–34.0)
MCHC: 33 g/dL (ref 30.0–36.0)
MCV: 79.1 fL (ref 78.0–100.0)
PLATELETS: 269 10*3/uL (ref 150–400)
RBC: 5.55 MIL/uL (ref 4.22–5.81)
RDW: 14 % (ref 11.5–15.5)
WBC: 7.3 10*3/uL (ref 4.0–10.5)

## 2014-07-10 LAB — BASIC METABOLIC PANEL
ANION GAP: 20 — AB (ref 5–15)
BUN: 17 mg/dL (ref 6–23)
CALCIUM: 8.8 mg/dL (ref 8.4–10.5)
CO2: 21 mEq/L (ref 19–32)
CREATININE: 0.99 mg/dL (ref 0.50–1.35)
Chloride: 99 mEq/L (ref 96–112)
GFR calc Af Amer: >90 mL/min (ref >90)
GFR calc non Af Amer: >90 mL/min (ref >90)
GLUCOSE: 136 mg/dL — AB (ref 70–99)
Potassium: 3.8 mEq/L (ref 3.7–5.3)
Sodium: 140 mEq/L (ref 137–147)

## 2014-07-10 LAB — CBG MONITORING, ED: Glucose-Capillary: 122 mg/dL — ABNORMAL HIGH (ref 70–99)

## 2014-07-10 MED ORDER — LORAZEPAM 2 MG/ML IJ SOLN: 1.0000 mg | Freq: Once | INTRAMUSCULAR | Status: DC

## 2014-07-10 NOTE — ED Provider Notes (Signed)
CSN: [id number]     Arrival date & time 07/10/14  2258 History   None    Chief Complaint  Patient presents with  . Seizures     (Consider location/radiation/quality/duration/timing/severity/associated sxs/prior Treatment) HPI   48 year old male with a generalized seizure prior to arrival. Last approximately 1 minute. Abated without intervention. Did bite her tongue. No incontinence. Confused afterwards consistent with postictal state. Currently no complaints. He is following commands and answering questions appropriately. Is a past history of seizure disorder. He is on Keppra. He reports compliance. No fever. He feels like his sleep has been adequately recently. Last seizure was several months ago. Has a neurologist.  Past Medical History  Diagnosis Date  . Migraine   . Seizure disorder, grand mal     PER PT LAST SEIZURE 01/2013  . BPH (benign prostatic hypertrophy)   . History of urinary retention     12/2011  . History of kidney stones   . Mild obstructive sleep apnea     PER STUDY 09-14-2009 NO CPAP RX  . Seizures    Past Surgical History  Procedure Laterality Date  . Shoulder surgery Left 2009  . Excision lipoma of scalp and face  03-24-2005  . Cysto/  right retrograde pyelogram/  ureteroscopy  12-23-2011  . Transurethral resection of prostate N/A 08/24/2013    Procedure: TRANSURETHRAL RESECTION OF THE PROSTATE WITH GYRUS INSTRUMENTS;  Surgeon: Bernestine Amass, MD;  [facility]  Service: Urology;  Laterality: N/A;   History reviewed. No pertinent family history. History  Substance Use Topics  . Smoking status: Never Smoker   . Smokeless tobacco: Never Used  . Alcohol Use: No    Review of Systems  All systems reviewed and negative, other than as noted in HPI.   Allergies  Review of patient's allergies indicates no known allergies.  Home Medications   Prior to Admission medications   Medication Sig Start Date End Date Taking? Authorizing  Provider  levETIRAcetam (KEPPRA) 500 MG tablet Take 1 tablet (500 mg total) by mouth 2 (two) times daily. 11/03/13   Marcial Pacas, MD  naproxen (NAPROSYN) 500 MG tablet Take 500 mg by mouth 2 (two) times daily as needed (pain).     Historical Provider, MD   BP 157/106  Pulse 92  Temp(Src) 98.2 F (36.8 C) (Oral)  Resp 16  Ht 5\' 8"  (1.727 m)  Wt 175 lb (79.379 kg)  BMI 26.61 kg/m2  SpO2 98% Physical Exam  Constitutional: He is oriented to person, place, and time. He appears well-developed and well-nourished. No distress.  HENT:  Head: Normocephalic.  Small amount of dried blood noted on lips, but no obvious intraoral source noted.  Eyes: Conjunctivae and EOM are normal. Pupils are equal, round, and reactive to light. Right eye exhibits no discharge. Left eye exhibits no discharge.  Neck: Normal range of motion. Neck supple.  No nuchal rigidity  Cardiovascular: Normal rate, regular rhythm and normal heart sounds.  Exam reveals no gallop and no friction rub.   No murmur heard. Pulmonary/Chest: Effort normal and breath sounds normal. No respiratory distress.  Abdominal: Soft. He exhibits no distension. There is no tenderness.  Musculoskeletal: He exhibits no edema or tenderness.  Neurological: He is alert and oriented to person, place, and time. No cranial nerve deficit. He exhibits normal muscle tone. Coordination normal.  Speech clear. Content appropriate. Follows commands. Cranial nerves are intact. Strength is 5 out of 5 bilateral upper lower extremities. No focal  motor deficit noted. Sensation is intact to light touch. Good finger to nose testing bilaterally. Gait is steady.  Skin: Skin is warm and dry.  Psychiatric: He has a normal mood and affect. His behavior is normal. Thought content normal.  Nursing note and vitals reviewed.   ED Course  Procedures (including critical care time) Labs Review Labs Reviewed  BASIC METABOLIC PANEL - Abnormal; Notable for the following:     Glucose, Bld 136 (*)    Anion gap 20 (*)    All other components within normal limits  CBG MONITORING, ED - Abnormal; Notable for the following:    Glucose-Capillary 122 (*)    All other components within normal limits  CBC    Imaging Review No results found.   EKG Interpretation None      MDM   Final diagnoses:  Seizure    48 year old male with seizure. Patient has a past history the same. Apparently he appears to be back to his baseline per wife. Nonfocal neuro exam. Workup today pretty unremarkable. He is afebrile. No nuchal rigidity. Discussed importance of medication compliance. Return precautions were discussed. File stable for discharge at this time.    Virgel Manifold, MD 07/15/14 [id number]

## 2014-07-10 NOTE — ED Notes (Signed)
Presents from a gas station, had a grad mal seizure lasting approx 1 minute.  Bite to tongue, post ictal and bump to right forehead. BP 178/104, HR 104. 107 CBG. Alert, ambulatory at this time.

## 2014-07-11 MED ORDER — LEVETIRACETAM 500 MG PO TABS
500.0000 mg | ORAL_TABLET | Freq: Once | ORAL | Status: DC
  Filled 2014-07-11: qty 1

## 2014-07-11 NOTE — Discharge Instructions (Signed)
Seizure, Adult °A seizure is abnormal electrical activity in the brain. Seizures usually last from 30 seconds to 2 minutes. There are various types of seizures. °Before a seizure, you may have a warning sensation (aura) that a seizure is about to occur. An aura may include the following symptoms:  °· Fear or anxiety. °· Nausea. °· Feeling like the room is spinning (vertigo). °· Vision changes, such as seeing flashing lights or spots. °Common symptoms during a seizure include: °· A change in attention or behavior (altered mental status). °· Convulsions with rhythmic jerking movements. °· Drooling. °· Rapid eye movements. °· Grunting. °· Loss of bladder and bowel control. °· Bitter taste in the mouth. °· Tongue biting. °After a seizure, you may feel confused and sleepy. You may also have an injury resulting from convulsions during the seizure. °HOME CARE INSTRUCTIONS  °· If you are given medicines, take them exactly as prescribed by your health care provider. °· Keep all follow-up appointments as directed by your health care provider. °· Do not swim or drive or engage in risky activity during which a seizure could cause further injury to you or others until your health care provider says it is OK. °· Get adequate rest. °· Teach friends and family what to do if you have a seizure. They should: °· Lay you on the ground to prevent a fall. °· Put a cushion under your head. °· Loosen any tight clothing around your neck. °· Turn you on your side. If vomiting occurs, this helps keep your airway clear. °· Stay with you until you recover. °· Know whether or not you need emergency care. °SEEK IMMEDIATE MEDICAL CARE IF: °· The seizure lasts longer than 5 minutes. °· The seizure is severe or you do not wake up immediately after the seizure. °· You have an altered mental status after the seizure. °· You are having more frequent or worsening seizures. °Someone should drive you to the emergency department or call local emergency  services (911 in [place] °MAKE SURE YOU: °· Understand these instructions. °· Will watch your condition. °· Will get help right away if you are not doing well or get worse. °Document Released: 08/24/2000 Document Revised: 06/17/2013 Document Reviewed: 04/08/2013 °ExitCare® Patient Information ©2015 [employer]. This information is not intended to replace advice given to you by your health care provider. Make sure you discuss any questions you have with your health care provider. ° °Driving and Equipment Restrictions °Some medical problems make it dangerous to drive, ride a bike, or use machines. Some of these problems are: °· A hard blow to the head (concussion). °· Passing out (fainting). °· Twitching and shaking (seizures). °· Low blood sugar. °· Taking medicine to help you relax (sedatives). °· Taking pain medicines. °· Wearing an eye patch. °· Wearing splints. This can make it hard to use parts of your body that you need to drive safely. °HOME CARE  °· Do not drive until your doctor says it is okay. °· Do not use machines until your doctor says it is okay. °You may need a form signed by your doctor (medical release) before you can drive again. You may also need this form before you do other tasks where you need to be fully alert. °MAKE SURE YOU: °· Understand these instructions. °· Will watch your condition. °· Will get help right away if you are not doing well or get worse. °Document Released: 10/04/2004 Document Revised: 11/19/2011 Document Reviewed: 01/04/2010 °ExitCare® Patient Information ©2015 [employer].   This information is not intended to replace advice given to you by your health care provider. Make sure you discuss any questions you have with your health care provider. ° °

## 2014-11-09 ENCOUNTER — Encounter: Payer: Self-pay | Admitting: Adult Health

## 2014-11-09 ENCOUNTER — Ambulatory Visit (INDEPENDENT_AMBULATORY_CARE_PROVIDER_SITE_OTHER): Payer: Medicaid Other | Admitting: Adult Health

## 2014-11-09 VITALS — BP 146/91 | HR 66 | Ht 68.0 in | Wt 198.0 lb

## 2014-11-09 DIAGNOSIS — R569 Unspecified convulsions: Secondary | ICD-10-CM

## 2014-11-09 NOTE — Progress Notes (Signed)
PATIENT: Francis Jackson DOB: 01-30-1966  REASON FOR VISIT: follow up- seizures HISTORY FROM: patient  HISTORY OF PRESENT ILLNESS: Mr. [name] is a 49 year old male with a history of seizures and obstructive sleep apnea on CPAP. He returns today for follow-up. He is currently taking Keppra 500 mg twice a day. Patient did have a grand mal seizure in October and was taken to the emergency room. He states that he missed taking his medication that day. At that time no changes were made in his medication. The patient reports that since then he has not had any seizures. He does not operate a motor vehicle. He is able to complete all ADLs independently. No changes in his gait or balance. No new medical issues since last seen.  HISTORY 11/03/13 Francis Jackson): Francis PLEMMONS is all 49 years old right-handed [place] American male, native of [place], return to clinic for followup seizure, last clinical visit was May 2013 with [name]  He began to have seizure since August 05 2009, it was nocturnal seizure, generalized tonic-clonic lasting about 10-15 minutes.  Evaluation showed normal MRI of the brain, EEG, laboratory showed normal CBC, CMP, UA, negative UDS.  Second nocturnal seizure July 2011, third seizure in February 2012, he was visiting his friend with his wife, he began to circling around, confused, and then went to tonic-clonic , he was started on Keppra 500 mg twice a day in 2012,  4 seizure was in June 2013, while he was not compliant with his Keppra, he was taken to emergency room, had multiple abrasions to his face, and right eye, CAT scan of the brain and neck showed no significant abnormality.  Fifths seizure in May 2014, again happened when he was not compliant with his Keppra. Proceeding by rising sensation, nauseous,  He also complains of symptoms consistent with obstructive sleep apnea, had sleep study in January 2011, at [facility], by Dr. Kimberlee Nearing, there was evidence of mild obstructive sleep  apnea, lowest SpO2 was 82%, he was given advise of losing weight, sleep on his side, he continued to have frequent snoring, wife has to shake him to stop snoring, start breathing almost every night  REVIEW OF SYSTEMS: Out of a complete 14 system review of symptoms, the patient complains only of the following symptoms, and all other reviewed systems are negative.  See HPI  ALLERGIES: No Known Allergies  HOME MEDICATIONS: Outpatient Prescriptions Prior to Visit  Medication Sig Dispense Refill  . levETIRAcetam (KEPPRA) 500 MG tablet Take 1 tablet (500 mg total) by mouth 2 (two) times daily. 60 tablet 12  . naproxen (NAPROSYN) 500 MG tablet Take 500 mg by mouth 2 (two) times daily as needed (pain).      No facility-administered medications prior to visit.    PAST MEDICAL HISTORY: Past Medical History  Diagnosis Date  . Migraine   . Seizure disorder, grand mal     PER PT LAST SEIZURE 01/2013  . BPH (benign prostatic hypertrophy)   . History of urinary retention     12/2011  . History of kidney stones   . Mild obstructive sleep apnea     PER STUDY 09-14-2009 NO CPAP RX  . Seizures     PAST SURGICAL HISTORY: Past Surgical History  Procedure Laterality Date  . Shoulder surgery Left 2009  . Excision lipoma of scalp and face  03-24-2005  . Cysto/  right retrograde pyelogram/  ureteroscopy  12-23-2011  . Transurethral resection of prostate N/A 08/24/2013  Procedure: TRANSURETHRAL RESECTION OF THE PROSTATE WITH GYRUS INSTRUMENTS;  Surgeon: Bernestine Amass, MD;  Location: [facility];  Service: Urology;  Laterality: N/A;     PHYSICAL EXAM  Filed Vitals:   11/09/14 1110  BP: 146/91  Pulse: 66  Height: 5\' 8"  (1.727 m)  Weight: 198 lb (89.812 kg)   Body mass index is 30.11 kg/(m^2).  Generalized: Well developed, in no acute distress   Neurological examination  Mentation: Alert oriented to time, place, history taking. Follows all commands speech and language  fluent Cranial nerve II-XII: Pupils were equal round reactive to light. Extraocular movements were full, visual field were full on confrontational test. Facial sensation and strength were normal. Uvula tongue midline. Head turning and shoulder shrug  were normal and symmetric. Motor: The motor testing reveals 5 over 5 strength of all 4 extremities. Good symmetric motor tone is noted throughout.  Sensory: Sensory testing is intact to soft touch on all 4 extremities. No evidence of extinction is noted.  Coordination: Cerebellar testing reveals good finger-nose-finger and heel-to-shin bilaterally.  Gait and station: Gait is normal. Tandem gait is normal. Romberg is negative. No drift is seen.  Reflexes: Deep tendon reflexes are symmetric and normal bilaterally.    DIAGNOSTIC DATA (LABS, IMAGING, TESTING) - I reviewed patient records, labs, notes, testing and imaging myself where available.  Lab Results  Component Value Date   WBC 7.3 07/10/2014   HGB 14.5 07/10/2014   HCT 43.9 07/10/2014   MCV 79.1 07/10/2014   PLT 269 07/10/2014      Component Value Date/Time   NA 140 07/10/2014 2311   K 3.8 07/10/2014 2311   CL 99 07/10/2014 2311   CO2 21 07/10/2014 2311   GLUCOSE 136* 07/10/2014 2311   BUN 17 07/10/2014 2311   CREATININE 0.99 07/10/2014 2311   CALCIUM 8.8 07/10/2014 2311   PROT 7.8 02/12/2012 1726   ALBUMIN 4.3 02/12/2012 1726   AST 27 02/12/2012 1726   ALT 24 02/12/2012 1726   ALKPHOS 63 02/12/2012 1726   BILITOT 0.3 02/12/2012 1726   GFRNONAA >90 07/10/2014 2311   GFRAA >90 07/10/2014 2311      ASSESSMENT AND PLAN 49 y.o. year old male  has a past medical history of Migraine; Seizure disorder, grand mal; BPH (benign prostatic hypertrophy); History of urinary retention; History of kidney stones; Mild obstructive sleep apnea; and Seizures. here with:  1. Seizures  Overall the patient is doing well on Keppra 500 mg twice a day. He did have a seizure in October but states  that was due to him forgetting his medication. Since then the patient has not had any additional seizures. The patient will continue taking Keppra 500 mg twice a day. If he has any additional seizures he will let us know. He will follow-up in September and at that time we will review his CPAP download.  Ward Givens, MSN, NP-C 11/09/2014, 11:21 AM [facility] [address] [place], [place] [address] [phone]  Note: This document was prepared with digital dictation and possible smart phrase technology. Any transcriptional errors that result from this process are unintentional.

## 2014-11-09 NOTE — Patient Instructions (Signed)
Continue Keppra 500 mg twice a day.  If you have any additional seizures please let us know.

## 2014-11-11 NOTE — Progress Notes (Signed)
I have reviewed and agreed above plan. 
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
## 2014-11-30 ENCOUNTER — Telehealth: Payer: Self-pay | Admitting: Adult Health

## 2014-11-30 MED ORDER — LEVETIRACETAM 500 MG PO TABS: 500.0000 mg | ORAL_TABLET | Freq: Two times a day (BID) | ORAL | Status: DC

## 2014-11-30 NOTE — Telephone Encounter (Signed)
Patient requesting refill of levetiracetam 500 mg. He was last seen at the beginning of March. He uses [facility]  [phone].
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
## 2015-05-10 ENCOUNTER — Ambulatory Visit: Payer: Self-pay | Admitting: Adult Health

## 2015-05-10 ENCOUNTER — Telehealth: Payer: Self-pay

## 2015-05-10 NOTE — Telephone Encounter (Signed)
Patient No- showed his apt. Today with Megan.

## 2015-12-19 ENCOUNTER — Other Ambulatory Visit: Payer: Self-pay | Admitting: Adult Health

## 2015-12-19 NOTE — Telephone Encounter (Signed)
Spoke to pt will refill for one month, and made appt for tomorrow at 1400.

## 2015-12-20 ENCOUNTER — Ambulatory Visit (INDEPENDENT_AMBULATORY_CARE_PROVIDER_SITE_OTHER): Payer: Medicaid Other | Admitting: Adult Health

## 2015-12-20 ENCOUNTER — Encounter: Payer: Self-pay | Admitting: Adult Health

## 2015-12-20 VITALS — BP 123/76 | HR 61 | Ht 68.0 in | Wt 187.0 lb

## 2015-12-20 DIAGNOSIS — Z9989 Dependence on other enabling machines and devices: Secondary | ICD-10-CM

## 2015-12-20 DIAGNOSIS — R569 Unspecified convulsions: Secondary | ICD-10-CM | POA: Diagnosis not present

## 2015-12-20 DIAGNOSIS — G4733 Obstructive sleep apnea (adult) (pediatric): Secondary | ICD-10-CM

## 2015-12-20 MED ORDER — LEVETIRACETAM 500 MG PO TABS: ORAL_TABLET | ORAL | Status: DC

## 2015-12-20 NOTE — Progress Notes (Signed)
PATIENT: Francis Jackson DOB: 10-05-1965  REASON FOR VISIT: follow up- seizures HISTORY FROM: patient  HISTORY OF PRESENT ILLNESS: Francis Jackson is a 50 year old male with a history of seizures and obstructive sleep apnea on CPAP. He returns today for follow-up. He is currently taking Keppra 500 mg twice a day. He states that he has not had any additional seizure events. He operates a Teacher, music without difficulty. He is able to complete all ADLs independent living. Denies any changes in his gait or balance. Denies any changes with his mood or behavior. He continues to use the CPAP nightly- although he has not had a follow-up with Dr. Brett Fairy in over a year. I obtained a sleep download today and indicated that the patient used his machine 22 out of 30 days for compliance of 73%. He uses machine greater than 4 hours 20 out of 30 days for compliance of 67%. On average he uses his machine 5 hours and 36 minutes. His residual AHI of 7.6 on a pressure of 7 cm of water. He does not have a significant leak. In the past his AHI has been elevated at 6.4. Patient denies any new medical issues. He returns today for medication refills.  Update 11/09/14 (MM):  Francis Jackson is a 51 year old male with a history of seizures and obstructive sleep apnea on CPAP. He returns today for follow-up. He is currently taking Keppra 500 mg twice a day. Patient did have a grand mal seizure in October and was taken to the emergency room. He states that he missed taking his medication that day. At that time no changes were made in his medication. The patient reports that since then he has not had any seizures. He does not operate a motor vehicle. He is able to complete all ADLs independently. No changes in his gait or balance. No new medical issues since last seen.  HISTORY 11/03/13 [name]): Francis Jackson is all 50 years old right-handed [place] American male, native of [place], return to clinic for followup seizure, last clinical visit was  May 2013 with [name]  He began to have seizure since August 05 2009, it was nocturnal seizure, generalized tonic-clonic lasting about 10-15 minutes.  Evaluation showed normal MRI of the brain, EEG, laboratory showed normal CBC, CMP, UA, negative UDS.  Second nocturnal seizure July 2011, third seizure in February 2012, he was visiting his friend with his wife, he began to circling around, confused, and then went to tonic-clonic , he was started on Keppra 500 mg twice a day in 2012,  4 seizure was in June 2013, while he was not compliant with his Keppra, he was taken to emergency room, had multiple abrasions to his face, and right eye, CAT scan of the brain and neck showed no significant abnormality.  Fifths seizure in May 2014, again happened when he was not compliant with his Keppra. Proceeding by rising sensation, nauseous,  He also complains of symptoms consistent with obstructive sleep apnea, had sleep study in January 2011, at [facility], by Dr. Kimberlee Nearing, there was evidence of mild obstructive sleep apnea, lowest SpO2 was 82%, he was given advise of losing weight, sleep on his side, he continued to have frequent snoring, wife has to shake him to stop snoring, start breathing almost every night  REVIEW OF SYSTEMS: Out of a complete 14 system review of symptoms, the patient complains only of the following symptoms, and all other reviewed systems are negative.  See history of present illness  ALLERGIES: No Known Allergies  HOME MEDICATIONS: Outpatient Prescriptions Prior to Visit  Medication Sig Dispense Refill  . levETIRAcetam (KEPPRA) 500 MG tablet TAKE 1 TABLET (500 MG TOTAL) BY MOUTH 2 (TWO) TIMES DAILY. 60 tablet 0  . levofloxacin (LEVAQUIN) 500 MG tablet Take 500 mg by mouth daily.  0  . metoprolol succinate (TOPROL-XL) 50 MG 24 hr tablet Take 50 mg by mouth daily.  5  . naproxen (NAPROSYN) 500 MG tablet Take 500 mg by mouth 2 (two) times daily as needed (pain).     Marland Kitchen NAPROXEN DR  500 MG EC tablet Take 500 mg by mouth 2 (two) times daily as needed.  5  . traMADol (ULTRAM) 50 MG tablet 50 mg daily.  0   No facility-administered medications prior to visit.    PAST MEDICAL HISTORY: Past Medical History  Diagnosis Date  . Migraine   . Seizure disorder, grand mal ([place])     PER PT LAST SEIZURE 01/2013  . BPH (benign prostatic hypertrophy)   . History of urinary retention     12/2011  . History of kidney stones   . Mild obstructive sleep apnea     PER STUDY 09-14-2009 NO CPAP RX  . Seizures ([place])     PAST SURGICAL HISTORY: Past Surgical History  Procedure Laterality Date  . Shoulder surgery Left 2009  . Excision lipoma of scalp and face  03-24-2005  . Cysto/  right retrograde pyelogram/  ureteroscopy  12-23-2011  . Transurethral resection of prostate N/A 08/24/2013    Procedure: TRANSURETHRAL RESECTION OF THE PROSTATE WITH GYRUS INSTRUMENTS;  Surgeon: Bernestine Amass, MD;  Location: [facility];  Service: Urology;  Laterality: N/A;    FAMILY HISTORY: History reviewed. No pertinent family history.  SOCIAL HISTORY: Social History   Social History  . Marital Status: Married    Spouse Name: N/A  . Number of Children: 4  . Years of Education: 12   Occupational History  .      Taxi driver   Social History Main Topics  . Smoking status: Never Smoker   . Smokeless tobacco: Never Used  . Alcohol Use: No  . Drug Use: No  . Sexual Activity: Not on file   Other Topics Concern  . Not on file   Social History Narrative   Patient lives at home with his wife Sueanne Margarita)   Patient works as a Architect part time.   Right handed   Patient drinks one cup of tea per week.   Patient has four children.      PHYSICAL EXAM  Filed Vitals:   12/20/15 1349  BP: 123/76  Pulse: 61  Height: 5\' 8"  (1.727 m)  Weight: 187 lb (84.823 kg)   Body mass index is 28.44 kg/(m^2).  Generalized: Well developed, in no acute distress    Neurological examination  Mentation: Alert oriented to time, place, history taking. Follows all commands speech and language fluent Cranial nerve II-XII: Pupils were equal round reactive to light. Extraocular movements were full, visual field were full on confrontational test. Facial sensation and strength were normal. Uvula tongue midline. Head turning and shoulder shrug  were normal and symmetric. Motor: The motor testing reveals 5 over 5 strength of all 4 extremities. Good symmetric motor tone is noted throughout.  Sensory: Sensory testing is intact to soft touch on all 4 extremities. No evidence of extinction is noted.  Coordination: Cerebellar testing reveals good finger-nose-finger and heel-to-shin bilaterally.  Gait  and station: Gait is normal. Tandem gait is normal. Romberg is negative. No drift is seen.  Reflexes: Deep tendon reflexes are symmetric and normal bilaterally.   DIAGNOSTIC DATA (LABS, IMAGING, TESTING) - I reviewed patient records, labs, notes, testing and imaging myself where available.     ASSESSMENT AND PLAN 50 y.o. year old male  has a past medical history of Migraine; Seizure disorder, grand mal ([place]); BPH (benign prostatic hypertrophy); History of urinary retention; History of kidney stones; Mild obstructive sleep apnea; and Seizures ([place]). here with:  1. Seizures 2. OSA on CPAP  Overall the patient is doing well. He will Continue Keppra 500 mg twice a day. Patient advised that if he has any seizure events he should let us know. Patient's CPAP download shows good compliance the patient however should use machine greater than 4 hours more frequently. I will send an order to have his supplies replaced. He will follow-up in one year with Dr. [name].  Ward Givens, MSN, NP-C 12/20/2015, 1:56 PM [facility] [address], [place], [place] [address] [phone]

## 2015-12-20 NOTE — Patient Instructions (Addendum)
Keppra 500 mg twice a day Order sent for new CPAP supplies If you have any seizure events please let us know

## 2015-12-20 NOTE — Progress Notes (Signed)
I agree with the assessment and plan as directed by NP .The patient is known to me .   Zienna Ahlin, MD  

## 2015-12-30 ENCOUNTER — Other Ambulatory Visit: Payer: Self-pay | Admitting: Specialist

## 2015-12-30 DIAGNOSIS — M545 Low back pain: Secondary | ICD-10-CM

## 2016-01-20 ENCOUNTER — Other Ambulatory Visit: Payer: Medicaid Other

## 2016-01-20 ENCOUNTER — Ambulatory Visit
Admission: RE | Admit: 2016-01-20 | Discharge: 2016-01-20 | Disposition: A | Discharge status: Home / Self Care | Payer: Medicaid Other | Source: Ambulatory Visit | Attending: Specialist | Admitting: Specialist

## 2016-01-20 DIAGNOSIS — M545 Low back pain: Secondary | ICD-10-CM

## 2016-05-01 ENCOUNTER — Telehealth: Payer: Self-pay | Admitting: Adult Health

## 2016-05-01 NOTE — Telephone Encounter (Signed)
Patient has not received his CPAP supplies that were supposed to be ordered at last visit. He uses [facility] on [address]. HE requests a call back regarding this. [phone]

## 2016-05-01 NOTE — Telephone Encounter (Signed)
Spoke to pt and gave him [facility] tele [phone] for him to call and speak to them about supplies.  He stated he will call.   If there is a problem he is to call me back.   He verbalized understanding.

## 2016-06-14 ENCOUNTER — Ambulatory Visit (INDEPENDENT_AMBULATORY_CARE_PROVIDER_SITE_OTHER): Payer: Medicaid Other | Admitting: Specialist

## 2016-06-14 DIAGNOSIS — M4722 Other spondylosis with radiculopathy, cervical region: Secondary | ICD-10-CM | POA: Diagnosis not present

## 2016-07-09 ENCOUNTER — Telehealth (INDEPENDENT_AMBULATORY_CARE_PROVIDER_SITE_OTHER): Payer: Self-pay | Admitting: Specialist

## 2016-07-09 NOTE — Telephone Encounter (Signed)
Left message for return call to schedule surgery.

## 2016-07-27 ENCOUNTER — Telehealth (INDEPENDENT_AMBULATORY_CARE_PROVIDER_SITE_OTHER): Payer: Self-pay | Admitting: Specialist

## 2016-07-27 MED ORDER — TRAMADOL HCL 50 MG PO TABS: 50.0000 mg | ORAL_TABLET | Freq: Two times a day (BID) | ORAL | 0 refills | Status: DC | PRN

## 2016-07-27 NOTE — Telephone Encounter (Signed)
Please advise on rx.

## 2016-07-30 ENCOUNTER — Ambulatory Visit: Payer: Self-pay | Admitting: Surgery

## 2016-07-30 ENCOUNTER — Encounter: Payer: Self-pay | Admitting: Surgery

## 2016-07-30 NOTE — Telephone Encounter (Signed)
Called patient and advised.  Thanks [employer]

## 2016-07-30 NOTE — H&P (Signed)
[facility] 07/30/2016 2:19 PM Location: [facility] Patient #: [id number] DOB: August 29, 1966 Married / Language: English / Race: Black or African American Male  History of Present Illness Francis Hector MD; 07/30/2016 2:40 PM) The patient is a 50 year old male who presents with an inguinal hernia. Note for "Inguinal hernia": Patient sent for surgical consultation at the request of his urologist, Dr. Risa Grill. [facility]. Concern for right inguinal hernia  Pleasant active male. Has noted right groin swelling for the past few years. Presumed to be an inguinal hernia. Has become larger and more painful and bothersome to him. He drives a taxicab among other things. Used to have some urinary issues. Improved after a TURP procedure done by his urologist, Dr. Iver Nestle. Concern for worsening symptoms and size of hernia. Therefore surgical consultation requested. Patient originally from [place]. Was told he required emergent surgery when he was about 50 years old. He's not certain what was for. May be bleeding. Done through midline incision. No other abdominal surgeries. No hernias surgeries that he knows of. Moves his bowels at least a couple times a week. He does have sleep apnea but uses CPAP. History of seizure disorder well controlled on Keppra. Some back issues status post back surgery in the past.   Other Problems Marjean Donna, Ya[place]; 07/30/2016 2:19 PM) Back Pain Seizure Disorder Umbilical Hernia Repair  Past Surgical History Marjean Donna, CMA; 07/30/2016 2:19 PM) TURP  Diagnostic Studies History Marjean Donna, CMA; 07/30/2016 2:19 PM) Colonoscopy never  Allergies ([place], CMA; 07/30/2016 2:20 PM) No Known Drug Allergies 07/30/2016  Medication History (Sonya Bynum, CMA; 07/30/2016 2:21 PM) TraMADol HCl (50MG  Tablet, Oral as needed) Active. Naproxen (500MG  Tablet, Oral as needed) Active. Keppra (500MG  Tablet, Oral)  Active. Medications Reconciled  Social History ([place]; 07/30/2016 2:19 PM) Caffeine use Tea. No alcohol use No drug use Tobacco use Never smoker.  Family History Marjean Donna, [place]; 07/30/2016 2:19 PM) First Degree Relatives No pertinent family history     Review of Systems ([place]; 07/30/2016 2:19 PM) General Not Present- Appetite Loss, Chills, Fatigue, Fever, Night Sweats, Weight Gain and Weight Loss. Skin Not Present- Change in Wart/Mole, Dryness, Hives, Jaundice, New Lesions, Non-Healing Wounds, Rash and Ulcer. HEENT Present- Wears glasses/contact lenses. Not Present- Earache, Hearing Loss, Hoarseness, Nose Bleed, Oral Ulcers, Ringing in the Ears, Seasonal Allergies, Sinus Pain, Sore Throat, Visual Disturbances and Yellow Eyes. Breast Not Present- Breast Mass, Breast Pain, Nipple Discharge and Skin Changes. Cardiovascular Not Present- Chest Pain, Difficulty Breathing Lying Down, Leg Cramps, Palpitations, Rapid Heart Rate, Shortness of Breath and Swelling of Extremities. Gastrointestinal Not Present- Abdominal Pain, Bloating, Bloody Stool, Change in Bowel Habits, Chronic diarrhea, Constipation, Difficulty Swallowing, Excessive gas, Gets full quickly at meals, Hemorrhoids, Indigestion, Nausea, Rectal Pain and Vomiting. Male Genitourinary Not Present- Blood in Urine, Change in Urinary Stream, Frequency, Impotence, Nocturia, Painful Urination, Urgency and Urine Leakage. Musculoskeletal Present- Back Pain. Not Present- Joint Pain, Joint Stiffness, Muscle Pain, Muscle Weakness and Swelling of Extremities. Neurological Present- Seizures. Not Present- Decreased Memory, Fainting, Headaches, Numbness, Tingling, Tremor, Trouble walking and Weakness. Endocrine Not Present- Cold Intolerance, Excessive Hunger, Hair Changes, Heat Intolerance, Hot flashes and New Diabetes.  Vitals (Sonya Bynum CMA; 07/30/2016 2:20 PM) 07/30/2016 2:20 PM Weight: 183 lb Height:  68in Body Surface Area: 1.97 m Body Mass Index: 27.82 kg/m  Temp.: 97.65F(Temporal)  Pulse: 73 (Regular)  BP: 132/80 (Sitting, Left Arm, Standard)      Physical  Exam Francis Hector MD; 07/30/2016 2:37 PM)  General Mental Status-Alert. General Appearance-Not in acute distress, Not Sickly. Orientation-Oriented X3. Hydration-Well hydrated. Voice-Normal.  Integumentary Global Assessment Upon inspection and palpation of skin surfaces of the - Axillae: non-tender, no inflammation or ulceration, no drainage. and Distribution of scalp and body hair is normal. General Characteristics Temperature - normal warmth is noted.  Head and Neck Head-normocephalic, atraumatic with no lesions or palpable masses. Face Global Assessment - atraumatic, no absence of expression. Neck Global Assessment - no abnormal movements, no bruit auscultated on the right, no bruit auscultated on the left, no decreased range of motion, non-tender. Trachea-midline. Thyroid Gland Characteristics - non-tender.  Eye Eyeball - Left-Extraocular movements intact, No Nystagmus. Eyeball - Right-Extraocular movements intact, No Nystagmus. Cornea - Left-No Hazy. Cornea - Right-No Hazy. Sclera/Conjunctiva - Left-No scleral icterus, No Discharge. Sclera/Conjunctiva - Right-No scleral icterus, No Discharge. Pupil - Left-Direct reaction to light normal. Pupil - Right-Direct reaction to light normal. Note: Wears glasses. Vision acceptable  ENMT Ears Pinna - Left - no drainage observed, no generalized tenderness observed. Right - no drainage observed, no generalized tenderness observed. Nose and Sinuses External Inspection of the Nose - no destructive lesion observed. Inspection of the nares - Left - quiet respiration. Right - quiet respiration. Mouth and Throat Lips - Upper Lip - no fissures observed, no pallor noted. Lower Lip - no fissures observed, no pallor noted.  Nasopharynx - no discharge present. Oral Cavity/Oropharynx - Tongue - no dryness observed. Oral Mucosa - no cyanosis observed. Hypopharynx - no evidence of airway distress observed.  Chest and Lung Exam Inspection Movements - Normal and Symmetrical. Accessory muscles - No use of accessory muscles in breathing. Palpation Palpation of the chest reveals - Non-tender. Auscultation Breath sounds - Normal and Clear.  Cardiovascular Auscultation Rhythm - Regular. Murmurs & Other Heart Sounds - Auscultation of the heart reveals - No Murmurs and No Systolic Clicks.  Abdomen Inspection Inspection of the abdomen reveals - No Visible peristalsis and No Abnormal pulsations. Umbilicus - No Bleeding, No Urine drainage. Palpation/Percussion Palpation and Percussion of the abdomen reveal - Soft, Non Tender, No Rebound tenderness, No Rigidity (guarding) and No Cutaneous hyperesthesia. Note: Periumbilical midline incision with some invagination and scarring consistent with old laparotomy. Right lower quadrant scar consistent with drain site. Mild hypertrophic scarring but no keloid. Abdomen soft. Nontender, nondistended. No guarding. No umbilical no other hernias  Male Genitourinary Sexual Maturity Tanner 5 - Adult hair pattern and Adult penile size and shape. Note: Sensitive right inguinal hernia about the size of a golf ball. Smaller left-sided inguinal hernia about the size of a grape. Reducible.  Normal external genitalia. Epididymi, testes, and spermatic cords normal without any masses.  Peripheral Vascular Upper Extremity Inspection - Left - No Cyanotic nailbeds, Not Ischemic. Right - No Cyanotic nailbeds, Not Ischemic.  Neurologic Neurologic evaluation reveals -normal attention span and ability to concentrate, able to name objects and repeat phrases. Appropriate fund of knowledge , normal sensation and normal coordination. Mental Status Affect - not angry, not paranoid. Cranial  Nerves-Normal Bilaterally. Gait-Normal.  Neuropsychiatric Mental status exam performed with findings of-able to articulate well with normal speech/language, rate, volume and coherence, thought content normal with ability to perform basic computations and apply abstract reasoning and no evidence of hallucinations, delusions, obsessions or homicidal/suicidal ideation.  Musculoskeletal Global Assessment Spine, Ribs and Pelvis - no instability, subluxation or laxity. Right Upper Extremity - no instability, subluxation or laxity.  Lymphatic Head &  Neck  General Head & Neck Lymphatics: Bilateral - Description - No Localized lymphadenopathy. Axillary  General Axillary Region: Bilateral - Description - No Localized lymphadenopathy. Femoral & Inguinal  Generalized Femoral & Inguinal Lymphatics: Left - Description - No Localized lymphadenopathy. Right - Description - No Localized lymphadenopathy.    Assessment & Plan Francis Hector MD; 07/30/2016 2:39 PM)  BILATERAL INGUINAL HERNIA WITHOUT OBSTRUCTION OR GANGRENE, RECURRENCE NOT SPECIFIED (K40.20) Impression: Right greater than left inguinal hernias bilaterally. Think he would benefit from laparoscopic repair. Given midline scarring, maybe more TAPP vs TEP approach. However, not burning bridges by starting in TEP like mode.  PREOP - ING HERNIA - ENCOUNTER FOR PREOPERATIVE EXAMINATION FOR GENERAL SURGICAL PROCEDURE (Z01.818)  Current Plans You are being scheduled for surgery - Our schedulers will call you.  You should hear from our office's scheduling department within 5 working days about the location, date, and time of surgery. We try to make accommodations for patient's preferences in scheduling surgery, but sometimes the OR schedule or the surgeon's schedule prevents [place] from making those accommodations.  If you have not heard from our office [phone]) in 5 working days, call the office and ask for your surgeon's nurse.  If  you have other questions about your diagnosis, plan, or surgery, call the office and ask for your surgeon's nurse.  Written instructions provided The anatomy & physiology of the abdominal wall and pelvic floor was discussed. The pathophysiology of hernias in the inguinal and pelvic region was discussed. Natural history risks such as progressive enlargement, pain, incarceration, and strangulation was discussed. Contributors to complications such as smoking, obesity, diabetes, prior surgery, etc were discussed.  I feel the risks of no intervention will lead to serious problems that outweigh the operative risks; therefore, I recommended surgery to reduce and repair the hernia. I explained laparoscopic techniques with possible need for an open approach. I noted usual use of mesh to patch and/or buttress hernia repair  Risks such as bleeding, infection, abscess, need for further treatment, heart attack, death, and other risks were discussed. I noted a good likelihood this will help address the problem. Goals of post-operative recovery were discussed as well. Possibility that this will not correct all symptoms was explained. I stressed the importance of low-impact activity, aggressive pain control, avoiding constipation, & not pushing through pain to minimize risk of post-operative chronic pain or injury. Possibility of reherniation was discussed. We will work to minimize complications.  An educational handout further explaining the pathology & treatment options was given as well. Questions were answered. The patient expresses understanding & wishes to proceed with surgery.  Pt Education - Pamphlet Given - Laparoscopic Hernia Repair: discussed with patient and provided information. Pt Education - [facility] Pain Control (Elani Delph) Pt Education - [facility] Hernia Post-Op HCI (Meyer Arora): discussed with patient and provided information.  Francis Jackson, M.D., F.A.C.S. [facility]astrointestinal [facility] [address] [place], [place] [id number] [phone] Main / Paging

## 2016-09-11 ENCOUNTER — Telehealth (INDEPENDENT_AMBULATORY_CARE_PROVIDER_SITE_OTHER): Payer: Self-pay | Admitting: Specialist

## 2016-09-11 NOTE — Telephone Encounter (Signed)
Please advise 

## 2016-09-11 NOTE — Telephone Encounter (Signed)
Patient is requesting a refill of tramadol  [facility] [address]  Cb#: [id number]

## 2016-09-12 ENCOUNTER — Other Ambulatory Visit (INDEPENDENT_AMBULATORY_CARE_PROVIDER_SITE_OTHER): Payer: Self-pay | Admitting: Specialist

## 2016-09-12 MED ORDER — TRAMADOL HCL 50 MG PO TABS: 50.0000 mg | ORAL_TABLET | Freq: Two times a day (BID) | ORAL | 0 refills | Status: DC | PRN

## 2016-09-12 NOTE — Telephone Encounter (Signed)
Rx printed and I will sign, I think we should have him pick up the Rx,  jen

## 2016-09-13 NOTE — Progress Notes (Signed)
I called and advised that rx is ready for pick up at the front desk.   

## 2016-09-13 NOTE — Telephone Encounter (Signed)
I called and advised patient that the rx is ready for pick up.

## 2016-10-08 NOTE — Patient Instructions (Addendum)
Francis Jackson  10/08/2016   Your procedure is scheduled on: Thursday 10/18/2016  Report to [facility] Main  Entrance take [place]  elevators to 3rd floor to  [place] at  [place]  AM.  Call this number if you have problems the morning of surgery [phone]   Remember: ONLY 1 PERSON MAY GO WITH YOU TO SHORT STAY TO GET  READY MORNING OF [place].   Do not eat food or drink liquids :After Midnight.     PLEASE BRING YOUR CPAP MASK AND TUBING WITH YOU TO HOSPITAL MORNING OF SURGERY!     Take these medicines the morning of surgery with A SIP OF WATER: Levetiracetam (Keppra), Metoprolol (Toprol-XL)                                 You may not have any metal on your body including hair pins and              piercings  Do not wear jewelry, make-up, lotions, powders or perfumes, deodorant             Do not wear nail polish.  Do not shave  48 hours prior to surgery.              Men may shave face and neck.   Do not bring valuables to the hospital. [place].  Contacts, dentures or bridgework may not be worn into surgery.  Leave suitcase in the car. After surgery it may be brought to your room.     Patients discharged the day of surgery will not be allowed to drive home.  Name and phone number of your driver:  Special Instructions: N/A              Please read over the following fact sheets you were given: _____________________________________________________________________             [facility] - Preparing for Surgery Before surgery, you can play an important role.  Because skin is not sterile, your skin needs to be as free of germs as possible.  You can reduce the number of germs on your skin by washing with CHG (chlorahexidine gluconate) soap before surgery.  CHG is an antiseptic cleaner which kills germs and bonds with the skin to continue killing germs even after washing. Please DO NOT use if  you have an allergy to CHG or antibacterial soaps.  If your skin becomes reddened/irritated stop using the CHG and inform your nurse when you arrive at Short Stay. Do not shave (including legs and underarms) for at least 48 hours prior to the first CHG shower.  You may shave your face/neck. Please follow these instructions carefully:  1.  Shower with CHG Soap the night before surgery and the  morning of Surgery.  2.  If you choose to wash your hair, wash your hair first as usual with your  normal  shampoo.  3.  After you shampoo, rinse your hair and body thoroughly to remove the  shampoo.                           4.  Use CHG as you would any  other liquid soap.  You can apply chg directly  to the skin and wash                       Gently with a scrungie or clean washcloth.  5.  Apply the CHG Soap to your body ONLY FROM THE NECK DOWN.   Do not use on face/ open                           Wound or open sores. Avoid contact with eyes, ears mouth and genitals (private parts).                       Wash face,  Genitals (private parts) with your normal soap.             6.  Wash thoroughly, paying special attention to the area where your surgery  will be performed.  7.  Thoroughly rinse your body with warm water from the neck down.  8.  DO NOT shower/wash with your normal soap after using and rinsing off  the CHG Soap.                9.  Pat yourself dry with a clean towel.            10.  Wear clean pajamas.            11.  Place clean sheets on your bed the night of your first shower and do not  sleep with pets. Day of Surgery : Do not apply any lotions/deodorants the morning of surgery.  Please wear clean clothes to the hospital/surgery center.  FAILURE TO FOLLOW THESE INSTRUCTIONS MAY RESULT IN THE CANCELLATION OF YOUR SURGERY PATIENT SIGNATURE_________________________________  NURSE  SIGNATURE__________________________________  ________________________________________________________________________

## 2016-10-09 ENCOUNTER — Encounter (INDEPENDENT_AMBULATORY_CARE_PROVIDER_SITE_OTHER): Payer: Self-pay

## 2016-10-09 ENCOUNTER — Encounter (HOSPITAL_COMMUNITY): Payer: Self-pay

## 2016-10-09 ENCOUNTER — Encounter (HOSPITAL_COMMUNITY)
Admission: RE | Admit: 2016-10-09 | Discharge: 2016-10-09 | Disposition: A | Discharge status: Home / Self Care | Payer: Medicaid Other | Source: Ambulatory Visit | Attending: Surgery | Admitting: Surgery

## 2016-10-09 DIAGNOSIS — Z01812 Encounter for preprocedural laboratory examination: Secondary | ICD-10-CM | POA: Diagnosis present

## 2016-10-09 LAB — BASIC METABOLIC PANEL
Anion gap: 3 — ABNORMAL LOW (ref 5–15)
BUN: 10 mg/dL (ref 6–20)
CALCIUM: 8.7 mg/dL — AB (ref 8.9–10.3)
CO2: 31 mmol/L (ref 22–32)
Chloride: 106 mmol/L (ref 101–111)
Creatinine, Ser: 0.96 mg/dL (ref 0.61–1.24)
GFR calc Af Amer: >60 mL/min (ref >60)
GLUCOSE: 91 mg/dL (ref 65–99)
Potassium: 4.4 mmol/L (ref 3.5–5.1)
Sodium: 140 mmol/L (ref 135–145)

## 2016-10-09 LAB — CBC
HEMATOCRIT: 44.7 % (ref 39.0–52.0)
Hemoglobin: 14.5 g/dL (ref 13.0–17.0)
MCH: 25.3 pg — AB (ref 26.0–34.0)
MCHC: 32.4 g/dL (ref 30.0–36.0)
MCV: 78 fL (ref 78.0–100.0)
Platelets: 304 10*3/uL (ref 150–400)
RBC: 5.73 MIL/uL (ref 4.22–5.81)
RDW: 14.3 % (ref 11.5–15.5)
WBC: 3.5 10*3/uL — ABNORMAL LOW (ref 4.0–10.5)

## 2016-10-10 ENCOUNTER — Other Ambulatory Visit (INDEPENDENT_AMBULATORY_CARE_PROVIDER_SITE_OTHER): Payer: Self-pay | Admitting: Specialist

## 2016-10-18 ENCOUNTER — Encounter (HOSPITAL_COMMUNITY): Payer: Self-pay

## 2016-10-18 ENCOUNTER — Ambulatory Visit (HOSPITAL_COMMUNITY)
Admission: RE | Admit: 2016-10-18 | Discharge: 2016-10-18 | Disposition: A | Discharge status: Home / Self Care | Payer: Medicaid Other | Source: Ambulatory Visit | Attending: Surgery | Admitting: Surgery

## 2016-10-18 ENCOUNTER — Ambulatory Visit (HOSPITAL_COMMUNITY): Payer: Medicaid Other | Admitting: Registered Nurse

## 2016-10-18 ENCOUNTER — Encounter (HOSPITAL_COMMUNITY)
Admission: RE | Disposition: A | Discharge status: Home / Self Care | Payer: Self-pay | Source: Ambulatory Visit | Attending: Surgery

## 2016-10-18 DIAGNOSIS — R569 Unspecified convulsions: Secondary | ICD-10-CM

## 2016-10-18 DIAGNOSIS — D176 Benign lipomatous neoplasm of spermatic cord: Secondary | ICD-10-CM | POA: Insufficient documentation

## 2016-10-18 DIAGNOSIS — G40909 Epilepsy, unspecified, not intractable, without status epilepticus: Secondary | ICD-10-CM | POA: Diagnosis not present

## 2016-10-18 DIAGNOSIS — K4020 Bilateral inguinal hernia, without obstruction or gangrene, not specified as recurrent: Secondary | ICD-10-CM

## 2016-10-18 DIAGNOSIS — N401 Enlarged prostate with lower urinary tract symptoms: Secondary | ICD-10-CM | POA: Diagnosis present

## 2016-10-18 DIAGNOSIS — Z9989 Dependence on other enabling machines and devices: Secondary | ICD-10-CM

## 2016-10-18 DIAGNOSIS — G4733 Obstructive sleep apnea (adult) (pediatric): Secondary | ICD-10-CM

## 2016-10-18 DIAGNOSIS — G4730 Sleep apnea, unspecified: Secondary | ICD-10-CM | POA: Insufficient documentation

## 2016-10-18 DIAGNOSIS — N138 Other obstructive and reflux uropathy: Secondary | ICD-10-CM | POA: Diagnosis present

## 2016-10-18 HISTORY — PX: LAPAROSCOPIC LYSIS OF ADHESIONS: SHX5905

## 2016-10-18 HISTORY — DX: Calculus of ureter: N20.1

## 2016-10-18 HISTORY — DX: Gross hematuria: R31.0

## 2016-10-18 HISTORY — DX: Benign prostatic hyperplasia without lower urinary tract symptoms: N40.0

## 2016-10-18 HISTORY — PX: INGUINAL HERNIA REPAIR: SHX194

## 2016-10-18 HISTORY — PX: INSERTION OF MESH: SHX5868

## 2016-10-18 HISTORY — DX: Other retention of urine: R33.8

## 2016-10-18 SURGERY — REPAIR, HERNIA, INGUINAL, BILATERAL, LAPAROSCOPIC
Anesthesia: General | Site: Groin | Laterality: Bilateral

## 2016-10-18 MED ORDER — MIDAZOLAM HCL 5 MG/5ML IJ SOLN
INTRAMUSCULAR | Status: DC | PRN
  Administered 2016-10-18: 2 mg via INTRAVENOUS

## 2016-10-18 MED ORDER — LABETALOL HCL 5 MG/ML IV SOLN
INTRAVENOUS | Status: DC | PRN
  Administered 2016-10-18: 2.5 mg via INTRAVENOUS

## 2016-10-18 MED ORDER — PROPOFOL 10 MG/ML IV BOLUS
INTRAVENOUS | Status: AC
  Filled 2016-10-18: qty 20

## 2016-10-18 MED ORDER — CHLORHEXIDINE GLUCONATE CLOTH 2 % EX PADS: 6.0000 | MEDICATED_PAD | Freq: Once | CUTANEOUS | Status: DC

## 2016-10-18 MED ORDER — 0.9 % SODIUM CHLORIDE (POUR BTL) OPTIME
TOPICAL | Status: DC | PRN
  Administered 2016-10-18: 1000 mL

## 2016-10-18 MED ORDER — ROCURONIUM BROMIDE 50 MG/5ML IV SOSY
PREFILLED_SYRINGE | INTRAVENOUS | Status: AC
  Filled 2016-10-18: qty 5

## 2016-10-18 MED ORDER — MIDAZOLAM HCL 2 MG/2ML IJ SOLN
INTRAMUSCULAR | Status: AC
  Filled 2016-10-18: qty 2

## 2016-10-18 MED ORDER — FENTANYL CITRATE (PF) 100 MCG/2ML IJ SOLN
INTRAMUSCULAR | Status: AC
  Filled 2016-10-18: qty 2

## 2016-10-18 MED ORDER — NAPROXEN 500 MG PO TABS: 500.0000 mg | ORAL_TABLET | Freq: Two times a day (BID) | ORAL | 1 refills | Status: DC | PRN

## 2016-10-18 MED ORDER — CEFAZOLIN SODIUM-DEXTROSE 2-4 GM/100ML-% IV SOLN
2.0000 g | INTRAVENOUS | Status: AC
  Administered 2016-10-18: 2 g via INTRAVENOUS

## 2016-10-18 MED ORDER — FENTANYL CITRATE (PF) 100 MCG/2ML IJ SOLN
INTRAMUSCULAR | Status: DC | PRN
  Administered 2016-10-18 (×2): 50 ug via INTRAVENOUS
  Administered 2016-10-18: 100 ug via INTRAVENOUS

## 2016-10-18 MED ORDER — BUPIVACAINE HCL (PF) 0.25 % IJ SOLN
INTRAMUSCULAR | Status: AC
  Filled 2016-10-18: qty 60

## 2016-10-18 MED ORDER — HYDROMORPHONE HCL 1 MG/ML IJ SOLN
0.2500 mg | INTRAMUSCULAR | Status: DC | PRN
  Administered 2016-10-18: 0.25 mg via INTRAVENOUS

## 2016-10-18 MED ORDER — SODIUM CHLORIDE 0.9 % IJ SOLN
INTRAMUSCULAR | Status: AC
  Filled 2016-10-18: qty 10

## 2016-10-18 MED ORDER — HYDRALAZINE HCL 20 MG/ML IJ SOLN
INTRAMUSCULAR | Status: DC | PRN
  Administered 2016-10-18 (×2): 5 mg via INTRAVENOUS

## 2016-10-18 MED ORDER — ACETAMINOPHEN 500 MG PO TABS
1000.0000 mg | ORAL_TABLET | ORAL | Status: AC
  Administered 2016-10-18: 1000 mg via ORAL
  Filled 2016-10-18: qty 2

## 2016-10-18 MED ORDER — CEFAZOLIN SODIUM-DEXTROSE 2-4 GM/100ML-% IV SOLN
INTRAVENOUS | Status: AC
  Filled 2016-10-18: qty 100

## 2016-10-18 MED ORDER — LIDOCAINE 2% (20 MG/ML) 5 ML SYRINGE
INTRAMUSCULAR | Status: AC
  Filled 2016-10-18: qty 5

## 2016-10-18 MED ORDER — HYDROMORPHONE HCL 1 MG/ML IJ SOLN
INTRAMUSCULAR | Status: AC
  Filled 2016-10-18: qty 1

## 2016-10-18 MED ORDER — ONDANSETRON HCL 4 MG/2ML IJ SOLN
INTRAMUSCULAR | Status: DC | PRN
  Administered 2016-10-18: 4 mg via INTRAVENOUS

## 2016-10-18 MED ORDER — PHENYLEPHRINE 40 MCG/ML (10ML) SYRINGE FOR IV PUSH (FOR BLOOD PRESSURE SUPPORT)
PREFILLED_SYRINGE | INTRAVENOUS | Status: AC
  Filled 2016-10-18: qty 10

## 2016-10-18 MED ORDER — FENTANYL CITRATE (PF) 100 MCG/2ML IJ SOLN
25.0000 ug | INTRAMUSCULAR | Status: DC | PRN
  Administered 2016-10-18 (×3): 50 ug via INTRAVENOUS

## 2016-10-18 MED ORDER — CELECOXIB 200 MG PO CAPS
400.0000 mg | ORAL_CAPSULE | ORAL | Status: AC
  Administered 2016-10-18: 400 mg via ORAL
  Filled 2016-10-18: qty 2

## 2016-10-18 MED ORDER — TRAMADOL HCL 50 MG PO TABS: 50.0000 mg | ORAL_TABLET | Freq: Four times a day (QID) | ORAL | 0 refills | Status: DC | PRN

## 2016-10-18 MED ORDER — SUGAMMADEX SODIUM 200 MG/2ML IV SOLN
INTRAVENOUS | Status: DC | PRN
  Administered 2016-10-18: 200 mg via INTRAVENOUS

## 2016-10-18 MED ORDER — BUPIVACAINE HCL (PF) 0.25 % IJ SOLN
INTRAMUSCULAR | Status: DC | PRN
  Administered 2016-10-18: 60 mL

## 2016-10-18 MED ORDER — LACTATED RINGERS IV SOLN
INTRAVENOUS | Status: DC | PRN
  Administered 2016-10-18: 07:00:00 via INTRAVENOUS

## 2016-10-18 MED ORDER — PROMETHAZINE HCL 25 MG/ML IJ SOLN: 6.2500 mg | INTRAMUSCULAR | Status: DC | PRN

## 2016-10-18 MED ORDER — PROPOFOL 10 MG/ML IV BOLUS
INTRAVENOUS | Status: DC | PRN
  Administered 2016-10-18: 200 mg via INTRAVENOUS

## 2016-10-18 MED ORDER — LABETALOL HCL 5 MG/ML IV SOLN
INTRAVENOUS | Status: AC
  Filled 2016-10-18: qty 4

## 2016-10-18 MED ORDER — GABAPENTIN 300 MG PO CAPS
300.0000 mg | ORAL_CAPSULE | ORAL | Status: AC
  Administered 2016-10-18: 300 mg via ORAL
  Filled 2016-10-18: qty 1

## 2016-10-18 MED ORDER — HYDRALAZINE HCL 20 MG/ML IJ SOLN
INTRAMUSCULAR | Status: AC
  Filled 2016-10-18: qty 1

## 2016-10-18 MED ORDER — ONDANSETRON HCL 4 MG/2ML IJ SOLN
INTRAMUSCULAR | Status: AC
  Filled 2016-10-18: qty 2

## 2016-10-18 MED ORDER — ROCURONIUM BROMIDE 10 MG/ML (PF) SYRINGE
PREFILLED_SYRINGE | INTRAVENOUS | Status: DC | PRN
  Administered 2016-10-18: 50 mg via INTRAVENOUS
  Administered 2016-10-18: 20 mg via INTRAVENOUS

## 2016-10-18 MED ORDER — PHENYLEPHRINE 40 MCG/ML (10ML) SYRINGE FOR IV PUSH (FOR BLOOD PRESSURE SUPPORT)
PREFILLED_SYRINGE | INTRAVENOUS | Status: DC | PRN
  Administered 2016-10-18: 40 ug via INTRAVENOUS

## 2016-10-18 MED ORDER — LIDOCAINE HCL (CARDIAC) 20 MG/ML IV SOLN
INTRAVENOUS | Status: DC | PRN
  Administered 2016-10-18: 100 mg via INTRAVENOUS

## 2016-10-18 MED ORDER — LABETALOL HCL 5 MG/ML IV SOLN
5.0000 mg | INTRAVENOUS | Status: DC | PRN
  Administered 2016-10-18: 5 mg via INTRAVENOUS

## 2016-10-18 MED ORDER — STERILE WATER FOR IRRIGATION IR SOLN
Status: DC | PRN
  Administered 2016-10-18: 1000 mL

## 2016-10-18 SURGICAL SUPPLY — 31 items
CABLE HIGH FREQUENCY MONO STRZ (ELECTRODE) ×4 IMPLANT
CHLORAPREP W/TINT 26ML (MISCELLANEOUS) ×6 IMPLANT
COVER SURGICAL LIGHT HANDLE (MISCELLANEOUS) ×6 IMPLANT
DEVICE SECURE STRAP 25 ABSORB (INSTRUMENTS) ×4 IMPLANT
DRAPE WARM FLUID 44X44 (DRAPE) ×4 IMPLANT
DRSG TEGADERM 2-3/8X2-3/4 SM (GAUZE/BANDAGES/DRESSINGS) ×10 IMPLANT
DRSG TEGADERM 4X4.75 (GAUZE/BANDAGES/DRESSINGS) ×4 IMPLANT
ELECT REM PT RETURN 9FT ADLT (ELECTROSURGICAL) ×4
ELECTRODE REM PT RTRN 9FT ADLT (ELECTROSURGICAL) ×2 IMPLANT
GAUZE SPONGE 2X2 8PLY STRL LF (GAUZE/BANDAGES/DRESSINGS) ×2 IMPLANT
GLOVE ECLIPSE 8.0 STRL XLNG CF (GLOVE) ×4 IMPLANT
GLOVE INDICATOR 8.0 STRL GRN (GLOVE) ×4 IMPLANT
GOWN STRL REUS W/TWL XL LVL3 (GOWN DISPOSABLE) ×12 IMPLANT
IRRIG SUCT STRYKERFLOW 2 WTIP (MISCELLANEOUS) ×4
IRRIGATION SUCT STRKRFLW 2 WTP (MISCELLANEOUS) IMPLANT
KIT BASIN OR (CUSTOM PROCEDURE TRAY) ×4 IMPLANT
MESH ULTRAPRO 6X6 15CM15CM (Mesh General) ×6 IMPLANT
PAD POSITIONING PINK XL (MISCELLANEOUS) ×4 IMPLANT
SCISSORS LAP 5X35 DISP (ENDOMECHANICALS) ×4 IMPLANT
SLEEVE ADV FIXATION 5X100MM (TROCAR) ×6 IMPLANT
SPONGE GAUZE 2X2 STER 10/PKG (GAUZE/BANDAGES/DRESSINGS) ×2
SUT MNCRL AB 4-0 PS2 18 (SUTURE) ×6 IMPLANT
SUT PDS AB 1 CT1 27 (SUTURE) ×6 IMPLANT
SUT VIC AB 2-0 SH 27 (SUTURE)
SUT VIC AB 2-0 SH 27X BRD (SUTURE) IMPLANT
TACKER 5MM HERNIA 3.5CML NAB (ENDOMECHANICALS) IMPLANT
TOWEL OR 17X26 10 PK STRL BLUE (TOWEL DISPOSABLE) ×4 IMPLANT
TRAY LAPAROSCOPIC (CUSTOM PROCEDURE TRAY) ×4 IMPLANT
TROCAR ADV FIXATION 5X100MM (TROCAR) ×4 IMPLANT
TROCAR XCEL BLUNT TIP 100MML (ENDOMECHANICALS) ×4 IMPLANT
TUBING INSUF HEATED (TUBING) ×4 IMPLANT

## 2016-10-18 NOTE — H&P (Signed)
[facility] 07/30/2016 2:19 PM Location: [facility] Patient #: [id number] DOB: [birth year] Married / Language: English / Race: Black or African American Male  Patient Care Team: Nolene Ebbs, MD as PCP - General (Internal Medicine) Michael Boston, MD as Consulting Physician (General Surgery) Ward Givens, NP as Consulting Physician (Gerontology) Jessy Oto, MD as Consulting Physician (Orthopedic Surgery) Rana Snare, MD as Consulting Physician (Urology)   History of Present Illness  The patient is a 51 year old male who presents with an inguinal hernia.   Note for "Inguinal hernia": Patient sent for surgical consultation at the request of his urologist, Dr. Risa Grill. [facility]. Concern for right inguinal hernia  Pleasant active male. Has noted right groin swelling for the past few years. Presumed to be an inguinal hernia. Has become larger and more painful and bothersome to him. He drives a taxicab among other things. Used to have some urinary issues. Improved after a TURP procedure done by his urologist, Dr. Iver Nestle. Concern for worsening symptoms and size of hernia. Therefore surgical consultation requested. Patient originally from [place]. Was told he required emergent surgery when he was about 51 years old. He's not certain what was for. May be bleeding. Done through midline incision. No other abdominal surgeries. No hernias surgeries that he knows of. Moves his bowels at least a couple times a week. He does have sleep apnea but uses CPAP. History of seizure disorder well controlled on Keppra. Some back issues status post back surgery in the past.   Other Problems Marjean Donna, [employer]; 07/30/2016 2:19 PM) Back Pain  Seizure Disorder  Umbilical Hernia Repair   Past Surgical History Marjean Donna, CMA; 07/30/2016 2:19 PM) TURP   Diagnostic Studies History Marjean Donna, CMA; 07/30/2016 2:19 PM) Colonoscopy  never  Allergies ([place], CMA; 07/30/2016 2:20 PM) No Known Drug Allergies 07/30/2016  Medication History (Sonya Bynum, CMA; 07/30/2016 2:21 PM) TraMADol HCl (50MG  Tablet, Oral as needed) Active. Naproxen (500MG  Tablet, Oral as needed) Active. Keppra (500MG  Tablet, Oral) Active. Medications Reconciled  Social History ([place]; 07/30/2016 2:19 PM) Caffeine use  Tea. No alcohol use  No drug use  Tobacco use  Never smoker.  Family History Marjean Donna, [place]; 07/30/2016 2:19 PM) First Degree Relatives  No pertinent family history     Review of Systems ([place]; 07/30/2016 2:19 PM) General Not Present- Appetite Loss, Chills, Fatigue, Fever, Night Sweats, Weight Gain and Weight Loss. Skin Not Present- Change in Wart/Mole, Dryness, Hives, Jaundice, New Lesions, Non-Healing Wounds, Rash and Ulcer. HEENT Present- Wears glasses/contact lenses. Not Present- Earache, Hearing Loss, Hoarseness, Nose Bleed, Oral Ulcers, Ringing in the Ears, Seasonal Allergies, Sinus Pain, Sore Throat, Visual Disturbances and Yellow Eyes. Breast Not Present- Breast Mass, Breast Pain, Nipple Discharge and Skin Changes. Cardiovascular Not Present- Chest Pain, Difficulty Breathing Lying Down, Leg Cramps, Palpitations, Rapid Heart Rate, Shortness of Breath and Swelling of Extremities. Gastrointestinal Not Present- Abdominal Pain, Bloating, Bloody Stool, Change in Bowel Habits, Chronic diarrhea, Constipation, Difficulty Swallowing, Excessive gas, Gets full quickly at meals, Hemorrhoids, Indigestion, Nausea, Rectal Pain and Vomiting. Male Genitourinary Not Present- Blood in Urine, Change in Urinary Stream, Frequency, Impotence, Nocturia, Painful Urination, Urgency and Urine Leakage. Musculoskeletal Present- Back Pain. Not Present- Joint Pain, Joint Stiffness, Muscle Pain, Muscle Weakness and Swelling of Extremities. Neurological Present- Seizures. Not Present- Decreased Memory, Fainting, Headaches, Numbness,  Tingling, Tremor, Trouble walking and Weakness. Endocrine Not Present- Cold Intolerance, Excessive Hunger, Hair Changes, Heat Intolerance,  Hot flashes and New Diabetes.  Vitals (Sonya Bynum CMA; 07/30/2016 2:20 PM) 07/30/2016 2:20 PM Weight: 183 lb Height: 68in Body Surface Area: 1.97 m Body Mass Index: 27.82 kg/m  Temp.: 97.36F(Temporal)  Pulse: 73 (Regular)  BP: 132/80 (Sitting, Left Arm, Standard)    BP (!) 166/104   Pulse 73   Temp 97.4 F (36.3 C) (Oral)   Resp 18   Ht 5\' 8"  (1.727 m)   Wt 83.9 kg (185 lb)   SpO2 100%   BMI 28.13 kg/m     Physical Exam Adin Hector MD; 07/30/2016 2:37 PM) General Mental Status-Alert. General Appearance-Not in acute distress, Not Sickly. Orientation-Oriented X3. Hydration-Well hydrated. Voice-Normal.  Integumentary Global Assessment Upon inspection and palpation of skin surfaces of the - Axillae: non-tender, no inflammation or ulceration, no drainage. and Distribution of scalp and body hair is normal. General Characteristics Temperature - normal warmth is noted.  Head and Neck Head-normocephalic, atraumatic with no lesions or palpable masses. Face Global Assessment - atraumatic, no absence of expression. Neck Global Assessment - no abnormal movements, no bruit auscultated on the right, no bruit auscultated on the left, no decreased range of motion, non-tender. Trachea-midline. Thyroid Gland Characteristics - non-tender.  Eye Eyeball - Left-Extraocular movements intact, No Nystagmus. Eyeball - Right-Extraocular movements intact, No Nystagmus. Cornea - Left-No Hazy. Cornea - Right-No Hazy. Sclera/Conjunctiva - Left-No scleral icterus, No Discharge. Sclera/Conjunctiva - Right-No scleral icterus, No Discharge. Pupil - Left-Direct reaction to light normal. Pupil - Right-Direct reaction to light normal. Note: Wears glasses. Vision acceptable   ENMT Ears Pinna - Left - no  drainage observed, no generalized tenderness observed. Right - no drainage observed, no generalized tenderness observed. Nose and Sinuses External Inspection of the Nose - no destructive lesion observed. Inspection of the nares - Left - quiet respiration. Right - quiet respiration. Mouth and Throat Lips - Upper Lip - no fissures observed, no pallor noted. Lower Lip - no fissures observed, no pallor noted. Nasopharynx - no discharge present. Oral Cavity/Oropharynx - Tongue - no dryness observed. Oral Mucosa - no cyanosis observed. Hypopharynx - no evidence of airway distress observed.  Chest and Lung Exam Inspection Movements - Normal and Symmetrical. Accessory muscles - No use of accessory muscles in breathing. Palpation Palpation of the chest reveals - Non-tender. Auscultation Breath sounds - Normal and Clear.  Cardiovascular Auscultation Rhythm - Regular. Murmurs & Other Heart Sounds - Auscultation of the heart reveals - No Murmurs and No Systolic Clicks.  Abdomen Inspection Inspection of the abdomen reveals - No Visible peristalsis and No Abnormal pulsations. Umbilicus - No Bleeding, No Urine drainage. Palpation/Percussion Palpation and Percussion of the abdomen reveal - Soft, Non Tender, No Rebound tenderness, No Rigidity (guarding) and No Cutaneous hyperesthesia. Note: Periumbilical midline incision with some invagination and scarring consistent with old laparotomy. Right lower quadrant scar consistent with drain site. Mild hypertrophic scarring but no keloid. Abdomen soft. Nontender, nondistended. No guarding. No umbilical no other hernias   Male Genitourinary Sexual Maturity Tanner 5 - Adult hair pattern and Adult penile size and shape.  Note: Sensitive right inguinal hernia about the size of a golf ball. Smaller left-sided inguinal hernia about the size of a grape. Reducible.  Normal external genitalia. Epididymi, testes, and spermatic cords normal without any  masses.   Peripheral Vascular Upper Extremity Inspection - Left - No Cyanotic nailbeds, Not Ischemic. Right - No Cyanotic nailbeds, Not Ischemic.  Neurologic Neurologic evaluation reveals -normal attention span and ability to concentrate,  able to name objects and repeat phrases. Appropriate fund of knowledge , normal sensation and normal coordination. Mental Status Affect - not angry, not paranoid. Cranial Nerves-Normal Bilaterally. Gait-Normal.  Neuropsychiatric Mental status exam performed with findings of-able to articulate well with normal speech/language, rate, volume and coherence, thought content normal with ability to perform basic computations and apply abstract reasoning and no evidence of hallucinations, delusions, obsessions or homicidal/suicidal ideation.  Musculoskeletal Global Assessment Spine, Ribs and Pelvis - no instability, subluxation or laxity. Right Upper Extremity - no instability, subluxation or laxity.  Lymphatic Head & Neck  General Head & Neck Lymphatics: Bilateral - Description - No Localized lymphadenopathy. Axillary  General Axillary Region: Bilateral - Description - No Localized lymphadenopathy. Femoral & Inguinal  Generalized Femoral & Inguinal Lymphatics: Left - Description - No Localized lymphadenopathy. Right - Description - No Localized lymphadenopathy.    Assessment & Plan  BILATERAL INGUINAL HERNIA WITHOUT OBSTRUCTION OR GANGRENE, RECURRENCE NOT SPECIFIED (K40.20) Impression: Right greater than left inguinal hernias bilaterally. Think he would benefit from laparoscopic repair. Given midline scarring, maybe more TAPP vs TEP approach. However, not burning bridges by starting in TEP like mode.  Ready for surgery  PREOP - ING HERNIA - ENCOUNTER FOR PREOPERATIVE EXAMINATION FOR GENERAL SURGICAL PROCEDURE (Z01.818) Current Plans You are being scheduled for surgery - Our schedulers will call you.  You should hear from our office's  scheduling department within 5 working days about the location, date, and time of surgery. We try to make accommodations for patient's preferences in scheduling surgery, but sometimes the OR schedule or the surgeon's schedule prevents [place] from making those accommodations.  If you have not heard from our office [phone]) in 5 working days, call the office and ask for your surgeon's nurse.  If you have other questions about your diagnosis, plan, or surgery, call the office and ask for your surgeon's nurse.  Written instructions provided The anatomy & physiology of the abdominal wall and pelvic floor was discussed. The pathophysiology of hernias in the inguinal and pelvic region was discussed. Natural history risks such as progressive enlargement, pain, incarceration, and strangulation was discussed. Contributors to complications such as smoking, obesity, diabetes, prior surgery, etc were discussed.  I feel the risks of no intervention will lead to serious problems that outweigh the operative risks; therefore, I recommended surgery to reduce and repair the hernia. I explained laparoscopic techniques with possible need for an open approach. I noted usual use of mesh to patch and/or buttress hernia repair  Risks such as bleeding, infection, abscess, need for further treatment, heart attack, death, and other risks were discussed. I noted a good likelihood this will help address the problem. Goals of post-operative recovery were discussed as well. Possibility that this will not correct all symptoms was explained. I stressed the importance of low-impact activity, aggressive pain control, avoiding constipation, & not pushing through pain to minimize risk of post-operative chronic pain or injury. Possibility of reherniation was discussed. We will work to minimize complications.  An educational handout further explaining the pathology & treatment options was given as well. Questions were answered. The patient  expresses understanding & wishes to proceed with surgery.  Pt Education - Pamphlet Given - Laparoscopic Hernia Repair: discussed with patient and provided information. Pt Education - [facility] Pain Control (Briannon Boggio) Pt Education - [facility] Hernia Post-Op HCI (Greig Altergott): discussed with patient and provided information.  Adin Hector, M.D., F.A.C.S. Gastrointestinal and Minimally Invasive Surgery [facility] [address],  [address] [place], [place] [address] [phone] Main / Paging

## 2016-10-18 NOTE — Anesthesia Postprocedure Evaluation (Signed)
Anesthesia Post Note  Patient: Francis Jackson  Procedure(s) Performed: Procedure(s) (LRB): LAPAROSCOPIC EXPLORATION AND REPAIR OF BILATERAL INGUINAL HERNIA WITH A TAPP REPAIR (Bilateral) INSERTION OF MESH (Bilateral) LAPAROSCOPIC LYSIS OF ADHESIONS  Patient location during evaluation: PACU Anesthesia Type: General Level of consciousness: awake and alert Pain management: pain level controlled Vital Signs Assessment: post-procedure vital signs reviewed and stable Respiratory status: spontaneous breathing, nonlabored ventilation, respiratory function stable and patient connected to nasal cannula oxygen Cardiovascular status: blood pressure returned to baseline and stable Postop Assessment: no signs of nausea or vomiting Anesthetic complications: no       Last Vitals:  Vitals:   10/18/16 1144 10/18/16 1213  BP: 114/79 (!) 144/93  Pulse: 64 71  Resp: 16 16  Temp:  36.7 C    Last Pain:  Vitals:   10/18/16 1213  TempSrc: Oral  PainSc:                  Catalina Gravel

## 2016-10-18 NOTE — Op Note (Signed)
10/18/2016  10:09 AM  PATIENT:  Francis Jackson  51 y.o. male  Patient Care Team: Nolene Ebbs, MD as PCP - General (Internal Medicine) Michael Boston, MD as Consulting Physician (General Surgery) Ward Givens, NP as Consulting Physician (Gerontology) Jessy Oto, MD as Consulting Physician (Orthopedic Surgery) Rana Snare, MD as Consulting Physician (Urology)  PRE-OPERATIVE DIAGNOSIS:  BILATERAL INGUINAL NEW HERNIAS  POST-OPERATIVE DIAGNOSIS:  BILATERAL INGUINAL NEW HERNIAS  PROCEDURE:    LAPAROSCOPIC REPAIR OF BILATERAL INGUINAL HERNIA  (TAPP REPAIR) INSERTION OF MESH LAPAROSCOPIC LYSIS OF ADHESIONS  SURGEON:  Adin Hector, MD  ASSISTANT: None  ANESTHESIA:     Regional ilioinguinal and genitofemoral and spermatic cord nerve blocks  General  EBL:  Total I/O In: 1000 [I.V.:1000] Out: 25 [Blood:25].  See anesthesia record  Delay start of Pharmacological VTE agent (>24hrs) due to surgical blood loss or risk of bleeding:  no  DRAINS: NONE  SPECIMEN:  NONE  DISPOSITION OF SPECIMEN:  N/A  COUNTS:  YES  PLAN OF CARE: Discharge to home after PACU  PATIENT DISPOSITION:  PACU - hemodynamically stable.  INDICATION: Pleasant gentleman with symptomatic right inguinal hernia.  Incidental left inguinal hernia noted as well.  I recommended laparoscopic exploration and repair.  History of prior laparotomy when he lived until go to [place].  No other abdominal surgeries.  I noted increased chance of conversion to transabdominal TAPP approach or open approach.  The anatomy & physiology of the abdominal wall and pelvic floor was discussed.  The pathophysiology of hernias in the inguinal and pelvic region was discussed.  Natural history risks such as progressive enlargement, pain, incarceration & strangulation was discussed.   Contributors to complications such as smoking, obesity, diabetes, prior surgery, etc were discussed.    I feel the risks of no intervention will lead to  serious problems that outweigh the operative risks; therefore, I recommended surgery to reduce and repair the hernia.  I explained laparoscopic techniques with possible need for an open approach.  I noted usual use of mesh to patch and/or buttress hernia repair  Risks such as bleeding, infection, abscess, need for further treatment, heart attack, death, and other risks were discussed.  I noted a good likelihood this will help address the problem.   Goals of post-operative recovery were discussed as well.  Possibility that this will not correct all symptoms was explained.  I stressed the importance of low-impact activity, aggressive pain control, avoiding constipation, & not pushing through pain to minimize risk of post-operative chronic pain or injury. Possibility of reherniation was discussed.  We will work to minimize complications.     An educational handout further explaining the pathology & treatment options was given as well.  Questions were answered.  The patient expresses understanding & wishes to proceed with surgery.  OR FINDINGS: Dense preperitoneal abdominal wall adhesions consistent with prior laparotomy.  Increased scarring overall consistent with his hypertrophic scarring on his abdominal wall incisions.    Right greater than left indirect inguinal hernias.  No femoral nor obturator hernias.  Some laxity in the direct spaces but no major hernias there.  DESCRIPTION:  The patient was identified & brought into the operating room. The patient was positioned supine with arms tucked. SCDs were active during the entire case. The patient underwent general anesthesia without any difficulty.  The abdomen was prepped and draped in a sterile fashion. The patient's bladder was emptied.  A Surgical Timeout confirmed our plan.  I made a transverse incision  through the inferior umbilical fold.  I made a small transverse nick through the anterior rectus fascia contralateral to the inguinal hernia side  and placed a 0-vicryl stitch through the fascia.  I placed a Hasson trocar into the preperitoneal plane.  Entry was clean.  We induced carbon dioxide insufflation. Camera inspection revealed no injury.  I used a 22mm angled scope to bluntly free the peritoneum off the infraumbilical anterior abdominal wall.  I created enough of a preperitoneal pocket to place 33mm ports into the right mid-abdomen into this preperitoneal cavity.  However the midline tissues were quite scarred.  Can never get a good pocket or working space.    Therefore I converted to more of a TAPP approach.  I placed 5 mm ports in the the upper abdominal region region 3.   he had some adhesions to the anterior bowel wall.  Most of them greater omentum.  I freed them off to the level of the umbilicus.  The infraumbilical adhesions in place.  Of a loop of sigmoid colon loosely adherent in the suprapubic region.  I left that alone.  I was able to score the peritoneum transversely at the level of the umbilicus from the anterior axillary line on the right to the left.  Got into the peritoneal space.   I had to sharply free off dense preperitoneal adhesions of his left rectus" muscle to this preperitoneal space as well as the linea alba.  Took some sharp focused cautery scissors.  Eventually encountered much more soft pockets laterally.  Freed those off.  Could see indirect hernias on both sides.  Her knee him going down to the pubic rim.  Freed off in a lateral to midline fashion.  c chiseled off the last of the left posterior rectus and linea alba adhesions.    I focused attention on the LEFT pelvis.   I used blunt & focused sharp dissection to free the peritoneum off the flank and down to the pubic rim.  I freed the anteriolateral bladder wall off the anteriolateral pelvic wall, sparing midline attachments.   I located a swath of peritoneum going into a hernia fascial defect at the  internal ring consistent with  an indirect inguinal hernia..  I  gradually freed the peritoneal hernia sac off safely and reduced it into the preperitoneal space.  I freed the peritoneum off the spermatic vessels & vas deferens.  I freed peritoneum off the retroperitoneum along the psoas muscle.  Spermatic cord lipoma was dissected away & removed.  I checked & assured hemostasis.     I turned attention on the opposite  RIGHT pelvis.  I did dissection in a similar, mirror-image fashion. The patient had an indirect inguinal hernia..   He had a larger sac urine with dense adhesions.  He had a very atrophic vas deference.  And up having to free that off as it was very scarred down and atrophic.  There was a large ppermatic cord lipoma was dissected away & removed.  This left an atrophic but intact right testicular vascular pedicle going into the dilated hernia at the right internal ring.   I checked & assured hemostasis.     I chose 15x15 cm sheets of ultra-lightweight polypropylene mesh (Ultrapro), one for each side.  I cut a single sigmoid-shaped slit ~6cm from a corner of each mesh.  I placed the meshes into the preperitoneal space & laid them as overlapping diamonds such that at the inferior points, a 6x6 cm  corner flap rested in the true anterolateral pelvis, covering the obturator & femoral foramina.   I allowed the bladder to return to the pubis, this helping tuck the corners of the mesh in the anteriolateral pelvis.  The medial corners overlapped each other across midline cephalad to the pubic rim.   Given the numerous hernias of moderate size, I placed a third 15x15cm mesh in the center as a vertical diamond.  The lateral wings of the mesh overlap across the direct spaces and internal rings where the dominant hernias were.  This provided good coverage and reinforcement of the hernia repairs.  Because of the central mesh placement with good overlap, I did not place any tacks.   I tacked the infraumbilical peritoneal flap to the posterior abdominal wall using absorbable  secure strap tacker.  Used omentum to help tack over a few small openings as well.  Mesh was completely covered.  I inspected the abdomen.  No injury to bowel: Or other abnormalities.  Hemostasis was good.  I evacuated carbon dioxide.  I closed the fascia with absorbable #1 PDS interrupted suture.  I closed the skin using 4-0 monocryl stitch.  Sterile dressings were applied.   The patient was extubated & arrived in the PACU in stable condition..  I had discussed postoperative care with the patient in the holding area.  Instructions are written in the chart.  I discussed operative findings, updated the patient's status, discussed probable steps to recovery, and gave postoperative recommendations to the patient's spouse.  Recommendations were made.  Questions were answered.  She expressed understanding & appreciation.   Adin Hector, M.D., F.A.C.S. Gastrointestinal and Minimally Invasive Surgery [facility] [address] [place], [place] [address] [phone] Main / Paging  10/18/2016 10:09 AM

## 2016-10-18 NOTE — Discharge Instructions (Signed)
HERNIA REPAIR: POST OP INSTRUCTIONS ° °###################################################################### ° °EAT °Gradually transition to a high fiber diet with a fiber supplement over the next few weeks after discharge.  Start with a pureed / full liquid diet (see below) ° °WALK °Walk an hour a day.  Control your pain to do that.   ° °CONTROL PAIN °Control pain so that you can walk, sleep, tolerate sneezing/coughing, go up/down stairs. ° °HAVE A BOWEL MOVEMENT DAILY °Keep your bowels regular to avoid problems.  OK to try a laxative to override constipation.  OK to use an antidairrheal to slow down diarrhea.  Call if not better after 2 tries ° °CALL IF YOU HAVE PROBLEMS/CONCERNS °Call if you are still struggling despite following these instructions. °Call if you have concerns not answered by these instructions ° °###################################################################### ° ° ° °1. DIET: Follow a light bland diet the first 24 hours after arrival home, such as soup, liquids, crackers, etc.  Be sure to include lots of fluids daily.  Avoid fast food or heavy meals as your are more likely to get nauseated.  Eat a low fat the next few days after surgery. °2. Take your usually prescribed home medications unless otherwise directed. °3. PAIN CONTROL: °a. Pain is best controlled by a usual combination of three different methods TOGETHER: °i. Ice/Heat °ii. Over the counter pain medication °iii. Prescription pain medication °b. Most patients will experience some swelling and bruising around the hernia(s) such as the bellybutton, groins, or old incisions.  Ice packs or heating pads (30-60 minutes up to 6 times a day) will help. Use ice for the first few days to help decrease swelling and bruising, then switch to heat to help relax tight/sore spots and speed recovery.  Some people prefer to use ice alone, heat alone, alternating between ice & heat.  Experiment to what works for you.  Swelling and bruising can take  several weeks to resolve.   °c. It is helpful to take an over-the-counter pain medication regularly for the first few weeks.  Choose one of the following that works best for you: °i. Naproxen (Aleve, etc)  Two 220mg tabs twice a day °ii. Ibuprofen (Advil, etc) Three 200mg tabs four times a day (every meal & bedtime) °iii. Acetaminophen (Tylenol, etc) 325-650mg four times a day (every meal & bedtime) °d. A  prescription for pain medication should be given to you upon discharge.  Take your pain medication as prescribed.  °i. If you are having problems/concerns with the prescription medicine (does not control pain, nausea, vomiting, rash, itching, etc), please call us [phone] to see if we need to switch you to a different pain medicine that will work better for you and/or control your side effect better. °ii. If you need a refill on your pain medication, please contact your pharmacy.  They will contact our office to request authorization. Prescriptions will not be filled after 5 pm or on week-ends. °4. Avoid getting constipated.  Between the surgery and the pain medications, it is common to experience some constipation.  Increasing fluid intake and taking a fiber supplement (such as Metamucil, Citrucel, FiberCon, MiraLax, etc) 1-2 times a day regularly will usually help prevent this problem from occurring.  A mild laxative (prune juice, Milk of Magnesia, MiraLax, etc) should be taken according to package directions if there are no bowel movements after 48 hours.   °5. Wash / shower every day.  You may shower over the dressings as they are waterproof.   °6. Remove   your waterproof bandages 5 days after surgery.  You may leave the incision open to air.  You may replace a dressing/Band-Aid to cover the incision for comfort if you wish.  Continue to shower over incision(s) after the dressing is off. ° ° ° °7. ACTIVITIES as tolerated:   °a. You may resume regular (light) daily activities beginning the next day--such  as daily self-care, walking, climbing stairs--gradually increasing activities as tolerated.  If you can walk 30 minutes without difficulty, it is safe to try more intense activity such as jogging, treadmill, bicycling, low-impact aerobics, swimming, etc. °b. Save the most intensive and strenuous activity for last such as sit-ups, heavy lifting, contact sports, etc  Refrain from any heavy lifting or straining until you are off narcotics for pain control.   °c. DO NOT PUSH THROUGH PAIN.  Let pain be your guide: If it hurts to do something, don't do it.  Pain is your body warning you to avoid that activity for another week until the pain goes down. °d. You may drive when you are no longer taking prescription pain medication, you can comfortably wear a seatbelt, and you can safely maneuver your car and apply brakes. °e. You may have sexual intercourse when it is comfortable.  °8. FOLLOW UP in our office °a. Please call [facility] at [phone] to set up an appointment to see your surgeon in the office for a follow-up appointment approximately 2-3 weeks after your surgery. °b. Make sure that you call for this appointment the day you arrive home to insure a convenient appointment time. °9.  IF YOU HAVE DISABILITY OR FAMILY LEAVE FORMS, BRING THEM TO THE OFFICE FOR PROCESSING.  DO NOT GIVE THEM TO YOUR DOCTOR. ° °WHEN TO CALL US [phone]: °1. Poor pain control °2. Reactions / problems with new medications (rash/itching, nausea, etc)  °3. Fever over 101.5 F (38.5 C) °4. Inability to urinate °5. Nausea and/or vomiting °6. Worsening swelling or bruising °7. Continued bleeding from incision. °8. Increased pain, redness, or drainage from the incision ° ° The clinic staff is available to answer your questions during regular business hours (8:30am-5pm).  Please don’t hesitate to call and ask to speak to one of our nurses for clinical concerns.  ° If you have a medical emergency, go to the nearest emergency room or call  911. ° A surgeon from [facility] is always on call at the hospitals in [place] ° °[facility] °[address], [address], [place], [place]  [address] ? ° [address] [address], ,    [address] °MAIN: [phone] ? TOLL FREE: [phone] ? FAX: [phone] °[email or web address] ° ° °

## 2016-10-18 NOTE — Anesthesia Preprocedure Evaluation (Addendum)
Anesthesia Evaluation  Patient identified by MRN, date of birth, ID band Patient awake    Reviewed: Allergy & Precautions, NPO status , Patient's Chart, lab work & pertinent test results  Airway Mallampati: II  TM Distance: >3 FB Neck ROM: Full    Dental  (+) Teeth Intact, Dental Advisory Given   Pulmonary sleep apnea and Continuous Positive Airway Pressure Ventilation ,    Pulmonary exam normal breath sounds clear to auscultation       Cardiovascular Exercise Tolerance: Good negative cardio ROS Normal cardiovascular exam Rhythm:Regular Rate:Normal     Neuro/Psych  Headaches, Seizures -, Well Controlled,     GI/Hepatic negative GI ROS, Neg liver ROS,   Endo/Other  negative endocrine ROS  Renal/GU negative Renal ROS     Musculoskeletal negative musculoskeletal ROS (+)   Abdominal   Peds  Hematology negative hematology ROS (+)   Anesthesia Other Findings Day of surgery medications reviewed with the patient.  Reproductive/Obstetrics                             Anesthesia Physical Anesthesia Plan  ASA: II  Anesthesia Plan: General   Post-op Pain Management:    Induction: Intravenous  Airway Management Planned: Oral ETT  Additional Equipment:   Intra-op Plan:   Post-operative Plan: Extubation in OR  Informed Consent: I have reviewed the patients History and Physical, chart, labs and discussed the procedure including the risks, benefits and alternatives for the proposed anesthesia with the patient or authorized representative who has indicated his/her understanding and acceptance.   Dental advisory given  Plan Discussed with: CRNA  Anesthesia Plan Comments: (Risks/benefits of general anesthesia discussed with patient including risk of damage to teeth, lips, gum, and tongue, nausea/vomiting, allergic reactions to medications, and the possibility of heart attack, stroke and death.   All patient questions answered.  Patient wishes to proceed.)        Anesthesia Quick Evaluation

## 2016-10-18 NOTE — Transfer of Care (Signed)
Immediate Anesthesia Transfer of Care Note  Patient: Francis Jackson  Procedure(s) Performed: Procedure(s): LAPAROSCOPIC EXPLORATION AND REPAIR OF BILATERAL INGUINAL HERNIA WITH A TAPP REPAIR (Bilateral) INSERTION OF MESH (Bilateral) LAPAROSCOPIC LYSIS OF ADHESIONS  Patient Location: PACU 2 Anesthesia Type:General  Level of Consciousness: sedated  Airway & Oxygen Therapy: Patient Spontanous Breathing and Patient connected to face mask oxygen  Post-op Assessment: Report given to RN and Post -op Vital signs reviewed and stable  Post vital signs: Reviewed and stable  Last Vitals:  Vitals:   10/18/16 1106 10/18/16 1118  BP: (!) 139/99 131/85  Pulse: 63 65  Resp: 14 15  Temp:  36.7 C    Last Pain:  Vitals:   10/18/16 1045  TempSrc:   PainSc: 5          Complications: No apparent anesthesia complications

## 2016-10-18 NOTE — Anesthesia Procedure Notes (Signed)
Procedure Name: Intubation Date/Time: 10/18/2016 7:33 AM Performed by: Carleene Cooper A Pre-anesthesia Checklist: Patient identified Patient Re-evaluated:Patient Re-evaluated prior to inductionOxygen Delivery Method: Circle system utilized Preoxygenation: Pre-oxygenation with 100% oxygen Intubation Type: IV induction Ventilation: Mask ventilation without difficulty Laryngoscope Size: Miller and 2 Grade View: Grade I Tube type: Oral Tube size: 7.5 mm Number of attempts: 1 Airway Equipment and Method: Stylet Placement Confirmation: ETT inserted through vocal cords under direct vision,  positive ETCO2 and breath sounds checked- equal and bilateral Secured at: 22 cm Tube secured with: Tape Dental Injury: Teeth and Oropharynx as per pre-operative assessment  Comments: Intubated by Jackquline Bosch SRNA

## 2016-10-18 NOTE — Interval H&P Note (Signed)
History and Physical Interval Note:  10/18/2016 7:10 AM  Francis Jackson  has presented today for surgery, with the diagnosis of BILATERAL INGUINAL NEW HERNIAS  The various methods of treatment have been discussed with the patient and family. After consideration of risks, benefits and other options for treatment, the patient has consented to  Procedure(s): [name] (Bilateral) as a surgical intervention .  The patient's history has been reviewed, patient examined, no change in status, stable for surgery.  I have reviewed the patient's chart and labs.  Questions were answered to the patient's satisfaction.     Keryl Gholson C.

## 2016-10-18 NOTE — Transfer of Care (Signed)
Immediate Anesthesia Transfer of Care Note  Patient: Francis Jackson  Procedure(s) Performed: Procedure(s): LAPAROSCOPIC EXPLORATION AND REPAIR OF BILATERAL INGUINAL HERNIA WITH A TAPP REPAIR (Bilateral) INSERTION OF MESH (Bilateral) LAPAROSCOPIC LYSIS OF ADHESIONS  Patient Location: PACU  Anesthesia Type:General  Level of Consciousness: awake, alert , oriented and patient cooperative  Airway & Oxygen Therapy: Patient Spontanous Breathing and Patient connected to face mask oxygen  Post-op Assessment: Report given to RN, Post -op Vital signs reviewed and stable and Patient moving all extremities  Post vital signs: Reviewed and stable  Last Vitals:  Vitals:   10/18/16 0558  BP: (!) 166/104  Pulse: 73  Resp: 18  Temp: 36.3 C    Last Pain:  Vitals:   10/18/16 0558  TempSrc: Oral         Complications: No apparent anesthesia complications

## 2016-11-02 ENCOUNTER — Ambulatory Visit (INDEPENDENT_AMBULATORY_CARE_PROVIDER_SITE_OTHER): Payer: Medicaid Other | Admitting: Specialist

## 2016-11-02 ENCOUNTER — Ambulatory Visit (INDEPENDENT_AMBULATORY_CARE_PROVIDER_SITE_OTHER): Payer: Self-pay

## 2016-11-02 ENCOUNTER — Encounter (INDEPENDENT_AMBULATORY_CARE_PROVIDER_SITE_OTHER): Payer: Self-pay | Admitting: Specialist

## 2016-11-02 VITALS — BP 155/83 | HR 73 | Ht 68.0 in | Wt 185.0 lb

## 2016-11-02 DIAGNOSIS — M542 Cervicalgia: Secondary | ICD-10-CM | POA: Diagnosis not present

## 2016-11-02 DIAGNOSIS — M545 Low back pain: Secondary | ICD-10-CM | POA: Diagnosis not present

## 2016-11-02 DIAGNOSIS — M25512 Pain in left shoulder: Secondary | ICD-10-CM

## 2016-11-02 NOTE — Progress Notes (Signed)
Office Visit Note   Patient: Francis Jackson           Date of Birth: 1966/08/05           MRN: [id number] Visit Date: 11/02/2016              Requested by: Nolene Ebbs, MD [address] [place], [place] [address] PCP: Philis Fendt, MD   Assessment & Plan: Visit Diagnoses:  1. Cervicalgia   2. Left shoulder pain, unspecified chronicity   3. Low back pain, unspecified back pain laterality, unspecified chronicity, with sciatica presence unspecified     Plan: Patient will follow up in 5 weeks for recheck. We'll see if Dr. gross general surgeon has cleared him from his recent abdominal surgery for [place] to schedule surgical intervention for his cervical spine. Patient was given a temporary handicap placard. Understands that until he is cleared from abdominal surgery that we are somewhat limited as to what we can do. Dr. Louanne Skye was previously planning on scheduling surgery after his October 2017 visit with [place].  Follow-Up Instructions: Return in about 5 weeks (around 12/07/2016).   Orders:  Orders Placed This Encounter  Procedures  . XR Cervical Spine 2 or 3 views  . XR Shoulder Left  . XR Lumbar Spine 2-3 Views   No orders of the defined types were placed in this encounter.     Procedures: No procedures performed   Clinical Data: No additional findings.   Subjective: Chief Complaint  Patient presents with  . Neck - Pain  . Left Shoulder - Pain  . Lower Back - Pain    Francis Jackson is here to follow up on his nec, left shoulder and low back pain.  He states that he is having the pain at the bottom of the neck.  He is having pain that is in the posterior shoulder that goes to the front. He is having low back pain.   He recently had hernia repair done.  Patient continues to have ongoing neck and low back pain. Patient was seen October 2017 and preoperative paperwork to have left C5-6 and left C6-7 foraminotomies was filled out but not scheduled. Patient is scheduled to see  general surgeon Dr. gross for follow-up of his recent hernia surgery.  Review of Systems  Constitutional: Negative.   Respiratory: Negative.   Cardiovascular: Negative.   Gastrointestinal: Positive for abdominal pain.  Musculoskeletal: Positive for back pain, neck pain and neck stiffness.  Neurological: Negative.   Psychiatric/Behavioral: Negative.      Objective: Vital Signs: BP (!) 155/83 (BP Location: Left Arm, Patient Position: Sitting)   Pulse 73   Ht 5\' 8"  (1.727 m)   Wt 185 lb (83.9 kg)   BMI 28.13 kg/m   Physical Exam  Constitutional: No distress.  HENT:  Head: Normocephalic and atraumatic.  Eyes: EOM are normal.  Pulmonary/Chest: No respiratory distress.  Musculoskeletal:  Gait is normal. Bowel shoulders good range motion. Negative impingement test. Negative drop arm. Good cuff strength. Good biceps and triceps strength. No focal motor deficits.  Psychiatric: He has a normal mood and affect.    Ortho Exam  Specialty Comments:  No specialty comments available.  Imaging: No results found.   PMFS History: Patient Active Problem List   Diagnosis Date Noted  . Bilateral inguinal hernia (BIH) s/p lap repair w mesh 10/18/2016 10/18/2016  . OSA on CPAP 05/07/2014  . Seizures ([place]) 11/03/2013  . BPH with obstruction/lower urinary tract symptoms 08/24/2013  Past Medical History:  Diagnosis Date  . Acute urinary retention 12/22/2011  . BPH (benign prostatic hyperplasia) 08/24/2013  . Gross hematuria 12/22/2011  . History of kidney stones   . History of urinary retention    12/2011  . Migraine   . Mild obstructive sleep apnea    PER STUDY 09-14-2009-uses CPAP  . Seizure disorder, grand mal ([place])    PER PT LAST SEIZURE 01/2013  . Seizures ([place])   . Ureteral calculus 12/22/2011    No family history on file.  Past Surgical History:  Procedure Laterality Date  . CYSTO/  RIGHT RETROGRADE PYELOGRAM/  URETEROSCOPY  12-23-2011  . EXCISION LIPOMA OF SCALP AND FACE   03-24-2005  . INGUINAL HERNIA REPAIR Bilateral 10/18/2016   Procedure: LAPAROSCOPIC EXPLORATION AND REPAIR OF BILATERAL INGUINAL HERNIA WITH A TAPP REPAIR;  Surgeon: Michael Boston, MD;  Location: [facility];  Service: General;  Laterality: Bilateral;  . INSERTION OF MESH Bilateral 10/18/2016   Procedure: INSERTION OF MESH;  Surgeon: Michael Boston, MD;  Location: [facility];  Service: General;  Laterality: Bilateral;  . LAPAROSCOPIC LYSIS OF ADHESIONS  10/18/2016   Procedure: LAPAROSCOPIC LYSIS OF ADHESIONS;  Surgeon: Michael Boston, MD;  Location: [facility];  Service: General;;  . SHOULDER SURGERY Left 2009  . TRANSURETHRAL RESECTION OF PROSTATE N/A 08/24/2013   Procedure: TRANSURETHRAL RESECTION OF THE PROSTATE WITH GYRUS INSTRUMENTS;  Surgeon: Bernestine Amass, MD;  Location: [facility];  Service: Urology;  Laterality: N/A;   Social History   Occupational History  . Taxi driver    Social History Main Topics  . Smoking status: Never Smoker  . Smokeless tobacco: Never Used  . Alcohol use No  . Drug use: No  . Sexual activity: Not on file

## 2016-11-14 ENCOUNTER — Encounter (HOSPITAL_COMMUNITY): Payer: Self-pay | Admitting: Emergency Medicine

## 2016-11-14 ENCOUNTER — Emergency Department (HOSPITAL_COMMUNITY)
Admission: EM | Admit: 2016-11-14 | Discharge: 2016-11-14 | Disposition: A | Discharge status: Home / Self Care | Payer: Medicaid Other | Attending: Emergency Medicine | Admitting: Emergency Medicine

## 2016-11-14 DIAGNOSIS — Z23 Encounter for immunization: Secondary | ICD-10-CM | POA: Diagnosis not present

## 2016-11-14 DIAGNOSIS — Y999 Unspecified external cause status: Secondary | ICD-10-CM | POA: Diagnosis not present

## 2016-11-14 DIAGNOSIS — S01511A Laceration without foreign body of lip, initial encounter: Secondary | ICD-10-CM

## 2016-11-14 DIAGNOSIS — S01512A Laceration without foreign body of oral cavity, initial encounter: Secondary | ICD-10-CM | POA: Insufficient documentation

## 2016-11-14 DIAGNOSIS — Y929 Unspecified place or not applicable: Secondary | ICD-10-CM | POA: Insufficient documentation

## 2016-11-14 DIAGNOSIS — W228XXA Striking against or struck by other objects, initial encounter: Secondary | ICD-10-CM | POA: Insufficient documentation

## 2016-11-14 DIAGNOSIS — Y939 Activity, unspecified: Secondary | ICD-10-CM | POA: Diagnosis not present

## 2016-11-14 DIAGNOSIS — Z79899 Other long term (current) drug therapy: Secondary | ICD-10-CM | POA: Insufficient documentation

## 2016-11-14 MED ORDER — LIDOCAINE HCL (PF) 1 % IJ SOLN
10.0000 mL | Freq: Once | INTRAMUSCULAR | Status: AC
  Administered 2016-11-14: 10 mL
  Filled 2016-11-14: qty 30

## 2016-11-14 MED ORDER — AMOXICILLIN 500 MG PO TABS: 500.0000 mg | ORAL_TABLET | Freq: Two times a day (BID) | ORAL | 0 refills | Status: DC

## 2016-11-14 MED ORDER — TETANUS-DIPHTH-ACELL PERTUSSIS 5-2.5-18.5 LF-MCG/0.5 IM SUSP
0.5000 mL | Freq: Once | INTRAMUSCULAR | Status: AC
  Administered 2016-11-14: 0.5 mL via INTRAMUSCULAR
  Filled 2016-11-14: qty 0.5
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
## 2016-11-14 NOTE — Discharge Instructions (Signed)
Please read instructions below. Talk with your PCP about any new medications. Return for fever, drainage from wound, uncontrolled bleeding, uncontrolled pain.

## 2016-11-23 ENCOUNTER — Emergency Department (HOSPITAL_COMMUNITY)
Admission: EM | Admit: 2016-11-23 | Discharge: 2016-11-23 | Disposition: A | Discharge status: Home / Self Care | Payer: Medicaid Other | Attending: Emergency Medicine | Admitting: Emergency Medicine

## 2016-11-23 ENCOUNTER — Encounter (HOSPITAL_COMMUNITY): Payer: Self-pay | Admitting: Emergency Medicine

## 2016-11-23 DIAGNOSIS — Z79899 Other long term (current) drug therapy: Secondary | ICD-10-CM | POA: Diagnosis not present

## 2016-11-23 DIAGNOSIS — Z5189 Encounter for other specified aftercare: Secondary | ICD-10-CM

## 2016-11-23 DIAGNOSIS — Z4802 Encounter for removal of sutures: Secondary | ICD-10-CM | POA: Diagnosis not present

## 2016-11-23 NOTE — Discharge Instructions (Signed)
Monitor for signs of laceration opening or infection including increased redness, swelling, bleeding, pain or discharge or fever.

## 2016-11-23 NOTE — ED Notes (Signed)
Bed: [facility]ALA Expected date:  Expected time:  Means of arrival:  Comments: No bed. 

## 2016-11-23 NOTE — ED Triage Notes (Signed)
Pt reports suture placed 1 week ago , left upper lip. Here for suture removal.

## 2016-11-23 NOTE — ED Notes (Signed)
Bed: WTR6 Expected date:  Expected time:  Means of arrival:  Comments: 

## 2016-11-23 NOTE — ED Provider Notes (Signed)
[facility] DEPT Provider Note   CSN: [id number] Arrival date & time: 11/23/16  1019  By signing my name below, I, Francis Jackson, attest that this documentation has been prepared under the direction and in the presence of Francis Sails, PA-C . Electronically Signed: Sonum Jackson, Scribe. 11/23/16. 11:43 AM.  History   Chief Complaint Chief Complaint  Patient presents with  . Suture / Staple Removal    The history is provided by the patient. No language interpreter was used.     HPI Comments: Francis Jackson is a 51 y.o. male who presents to the Emergency Department requesting wound check after receiving 5 sutures to the left lower lip on 11/14/16. He reports removing some of the sutures at home but states a few more remain. He denies associated bleeding, pain, swelling, or fever. He has been compliant with the previously prescribed antibiotic.    Past Medical History:  Diagnosis Date  . Acute urinary retention 12/22/2011  . BPH (benign prostatic hyperplasia) 08/24/2013  . Gross hematuria 12/22/2011  . History of kidney stones   . History of urinary retention    12/2011  . Migraine   . Mild obstructive sleep apnea    PER STUDY 09-14-2009-uses CPAP  . Seizure disorder, grand mal ([place])    PER PT LAST SEIZURE 01/2013  . Seizures ([place])   . Ureteral calculus 12/22/2011    Patient Active Problem List   Diagnosis Date Noted  . Bilateral inguinal hernia (BIH) s/p lap repair w mesh 10/18/2016 10/18/2016  . OSA on CPAP 05/07/2014  . Seizures ([place]) 11/03/2013  . BPH with obstruction/lower urinary tract symptoms 08/24/2013    Past Surgical History:  Procedure Laterality Date  . CYSTO/  RIGHT RETROGRADE PYELOGRAM/  URETEROSCOPY  12-23-2011  . EXCISION LIPOMA OF SCALP AND FACE  03-24-2005  . INGUINAL HERNIA REPAIR Bilateral 10/18/2016   Procedure: LAPAROSCOPIC EXPLORATION AND REPAIR OF BILATERAL INGUINAL HERNIA WITH A TAPP REPAIR;  Surgeon: Francis Boston, MD;  Location: [facility];  Service:  General;  Laterality: Bilateral;  . INSERTION OF MESH Bilateral 10/18/2016   Procedure: INSERTION OF MESH;  Surgeon: Francis Boston, MD;  Location: [facility];  Service: General;  Laterality: Bilateral;  . LAPAROSCOPIC LYSIS OF ADHESIONS  10/18/2016   Procedure: LAPAROSCOPIC LYSIS OF ADHESIONS;  Surgeon: Francis Boston, MD;  Location: [facility];  Service: General;;  . SHOULDER SURGERY Left 2009  . TRANSURETHRAL RESECTION OF PROSTATE N/A 08/24/2013   Procedure: TRANSURETHRAL RESECTION OF THE PROSTATE WITH GYRUS INSTRUMENTS;  Surgeon: Francis Amass, MD;  Location: [facility];  Service: Urology;  Laterality: N/A;       Home Medications    Prior to Admission medications   Medication Sig Start Date End Date Taking? Authorizing Provider  amoxicillin (AMOXIL) 500 MG tablet Take 1 tablet (500 mg total) by mouth 2 (two) times daily. 11/14/16   Francis Nicole Russo, PA-C  levETIRAcetam (KEPPRA) 500 MG tablet TAKE 1 TABLET (500 MG TOTAL) BY MOUTH 2 (TWO) TIMES DAILY. 12/20/15   Francis Givens, NP  naproxen (NAPROSYN) 500 MG tablet Take 1 tablet (500 mg total) by mouth 2 (two) times daily as needed (pain). 10/18/16   Francis Boston, MD  traMADol (ULTRAM) 50 MG tablet Take 1-2 tablets (50-100 mg total) by mouth every 6 (six) hours as needed for moderate pain or severe pain. 10/18/16   Francis Boston, MD    Family History No family history on file.  Social History Social History  Substance Use  Topics  . Smoking status: Never Smoker  . Smokeless tobacco: Never Used  . Alcohol use No     Allergies   Patient has no known allergies.   Review of Systems Review of Systems  Constitutional: Negative for chills and fever.  HENT: Negative for facial swelling.   Genitourinary: Negative for decreased urine volume.  Skin: Positive for wound. Negative for rash.  Neurological: Negative for light-headedness.     Physical Exam Updated Vital Signs BP (!) 146/96   Temp 97.9 F (36.6 C) (Oral)   Resp 16    SpO2 99%   Physical Exam  Constitutional: He is oriented to person, place, and time. He appears well-developed and well-nourished. No distress.  HENT:  Head: Normocephalic and atraumatic.  Right Ear: External ear normal.  Left Ear: External ear normal.  Nose: Nose normal.  Mouth/Throat: Oropharynx is clear and moist. No oropharyngeal exudate.  3 sutures noted to left upper lip without surrounding edema, erythema, fluctuance or induration. Laceration appears to be healing well. No intraoral oral injury. Teeth are intact.  Eyes: Conjunctivae and EOM are normal. Pupils are equal, round, and reactive to light. No scleral icterus.  Neck: Normal range of motion. Neck supple. No JVD present. No tracheal deviation present.  Cardiovascular: Normal rate, regular rhythm, normal heart sounds and intact distal pulses.   No murmur heard. Pulmonary/Chest: Effort normal and breath sounds normal. He has no wheezes.  Abdominal: Soft. He exhibits no distension. There is no tenderness.  Musculoskeletal: Normal range of motion. He exhibits no deformity.  Lymphadenopathy:    He has no cervical adenopathy.  Neurological: He is alert and oriented to person, place, and time.  Skin: Skin is warm and dry. Capillary refill takes less than 2 seconds.  Psychiatric: He has a normal mood and affect. His behavior is normal. Judgment and thought content normal.  Nursing note and vitals reviewed.    ED Treatments / Results  DIAGNOSTIC STUDIES: Oxygen Saturation is 99% on RA, normal by my interpretation.    COORDINATION OF CARE: 11:33 AM Discussed treatment plan with pt at bedside and pt agreed to plan.   Labs (all labs ordered are listed, but only abnormal results are displayed) Labs Reviewed - No data to display  EKG  EKG Interpretation None       Radiology No results found.  Procedures Procedures (including critical care time) SUTURE REMOVAL Performed by: No att. providers found Authorized by: No  att. providers found Consent: Verbal consent obtained. Consent given by: patient Required items: required blood products, implants, devices, and special equipment available  Time out: Immediately prior to procedure a "time out" was called to verify the correct patient, procedure, equipment, support staff and site/side marked as required. Location: left upper lip  Wound Appearance: healing well,intact without bleeding Sutures Removed: 3 Post-removal: none  Patient tolerance: Patient tolerated the procedure well with no immediate complications.   Medications Ordered in ED Medications - No data to display   Initial Impression / Assessment and Plan / ED Course  I have reviewed the triage vital signs and the nursing notes.  Pertinent labs & imaging results that were available during my care of the patient were reviewed by me and considered in my medical decision making (see chart for details).     Pt to ER for suture removal and wound check as above. Per previous ED provider patient's laceration was repaired with Vicryl rapide. Patient states that 2 sutures have come out on their own,  he states he can self feel three in his lip. 3 sutures noted and removed without complications.Procedure tolerated well. Vitals normal, no signs of infection. Scar minimization & return precautions given at dc.    Final Clinical Impressions(s) / ED Diagnoses   Final diagnoses:  Visit for suture removal  Encounter for wound re-check    New Prescriptions Discharge Medication List as of 11/23/2016 11:48 AM     I personally performed the services described in this documentation, which was scribed in my presence. The recorded information has been reviewed and is accurate.    Kinnie Feil, PA-C 11/23/16 [place], MD 11/23/16 1536

## 2016-11-29 ENCOUNTER — Telehealth: Payer: Self-pay | Admitting: Neurology

## 2016-11-29 ENCOUNTER — Telehealth: Payer: Self-pay | Admitting: *Deleted

## 2016-11-29 NOTE — Telephone Encounter (Signed)
Mekayla from [facility] called to confirm that the form was received. I confirmed that in the notes and with sandy as well, informed Mekayla that when Lovey Newcomer has time she will fax back. Mekayla said that would be fine as long as it is done by the 29th of March, she confirmed their fax# to be [phone]

## 2016-11-29 NOTE — Telephone Encounter (Signed)
error 

## 2016-11-29 NOTE — Telephone Encounter (Signed)
Received form ffom [facility] doctor report (for insurance coverage).  Pt has appt with Dr. [name] 12-19-16.  Last seen 12-14-15 with MM/NP.  LMVM for pt to return call.  I left message for pt to return call regarding sz in last 2 yrs, ? Dr. [name] to sign prior to being seen.

## 2016-11-29 NOTE — Telephone Encounter (Signed)
Form signed by Dr. [name].  To MR, Hilda Blades to find out about release information, payment.

## 2016-11-30 ENCOUNTER — Telehealth (INDEPENDENT_AMBULATORY_CARE_PROVIDER_SITE_OTHER): Payer: Self-pay | Admitting: *Deleted

## 2016-11-30 NOTE — Telephone Encounter (Signed)
Pt came in requesting tramadol refill.

## 2016-11-30 NOTE — Telephone Encounter (Signed)
Pt came in requesting tramadol refill

## 2016-12-05 ENCOUNTER — Telehealth (INDEPENDENT_AMBULATORY_CARE_PROVIDER_SITE_OTHER): Payer: Self-pay | Admitting: Radiology

## 2016-12-05 ENCOUNTER — Telehealth (INDEPENDENT_AMBULATORY_CARE_PROVIDER_SITE_OTHER): Payer: Self-pay | Admitting: Specialist

## 2016-12-05 MED ORDER — TRAMADOL HCL 50 MG PO TABS: 50.0000 mg | ORAL_TABLET | Freq: Four times a day (QID) | ORAL | 0 refills | Status: DC | PRN

## 2016-12-05 NOTE — Telephone Encounter (Signed)
FYI to you only Dr Louanne Skye, thanks- Patient came into office and asked about his Rx refill tramadol,  Request sent to [facility] on 11/30/16, I have asked Dr Ninfa Linden if he can refill this for patient as patient is here in office and in pain, and he agrees.  Rx printed, signed, given to patient.

## 2016-12-05 NOTE — Telephone Encounter (Signed)
You were going to call the pharmacy

## 2016-12-05 NOTE — Telephone Encounter (Signed)
Patient came back in and was very upset that his medication refill request has not been filled.

## 2016-12-05 NOTE — Telephone Encounter (Signed)
Patient called advised he only got 30 tabs and he usually get 60 tabs. Patient said he is short 30 tabs. Patient asked for a call back as soon as possible. The number to contact patient is [phone]

## 2016-12-05 NOTE — Telephone Encounter (Signed)
I spoke with patient, I advised him we refilled last Rx which was for #30, I have advised him to take these as sparingly as he can and when he follows up with Dr Louanne Skye on 12/20/16 he can discuss any further medication with Dr Louanne Skye. He understands.

## 2016-12-05 NOTE — Telephone Encounter (Signed)
Abigail Butts spoke with Dr. Ninfa Linden and he ok'd a refill on this rx for this patient

## 2016-12-06 DIAGNOSIS — Z0289 Encounter for other administrative examinations: Secondary | ICD-10-CM

## 2016-12-19 ENCOUNTER — Encounter (INDEPENDENT_AMBULATORY_CARE_PROVIDER_SITE_OTHER): Payer: Self-pay

## 2016-12-19 ENCOUNTER — Ambulatory Visit (INDEPENDENT_AMBULATORY_CARE_PROVIDER_SITE_OTHER): Payer: Medicaid Other | Admitting: Neurology

## 2016-12-19 ENCOUNTER — Encounter: Payer: Self-pay | Admitting: Neurology

## 2016-12-19 VITALS — BP 155/84 | HR 57 | Ht 68.0 in | Wt 181.0 lb

## 2016-12-19 DIAGNOSIS — Z9989 Dependence on other enabling machines and devices: Secondary | ICD-10-CM | POA: Diagnosis not present

## 2016-12-19 DIAGNOSIS — R569 Unspecified convulsions: Secondary | ICD-10-CM | POA: Diagnosis not present

## 2016-12-19 DIAGNOSIS — G4733 Obstructive sleep apnea (adult) (pediatric): Secondary | ICD-10-CM

## 2016-12-19 MED ORDER — LEVETIRACETAM 500 MG PO TABS: ORAL_TABLET | ORAL | 4 refills | Status: DC

## 2016-12-19 MED ORDER — LEVETIRACETAM ER 500 MG PO TB24: 1000.0000 mg | ORAL_TABLET | Freq: Every day | ORAL | 11 refills | Status: DC

## 2016-12-19 NOTE — Progress Notes (Signed)
PATIENT: Francis Jackson DOB: May 18, 1966  HISTORY OF PRESENT ILLNESS:  Francis Jackson is all 51 years old right-handed [place] American male, native of [place], return to clinic for followup seizure, last clinical visit was May 2013 with [name]  He began to have seizure since August 05 2009, it was nocturnal seizure, generalized tonic-clonic lasting about 10-15 minutes.  Evaluation showed normal MRI of the brain, EEG, laboratory showed normal CBC, CMP, UA, negative UDS.  Second nocturnal seizure July 2011, third seizure in February 2012, he was visiting his friend with his wife, he began to circling around, confused, and then went to tonic-clonic , he was started on Keppra 500 mg twice a day in 2012,  4 seizure was in June 2013, while he was not compliant with his Keppra, he was taken to emergency room, had multiple abrasions to his face, and right eye, CAT scan of the brain and neck showed no significant abnormality.  Fifths seizure in May 2014, again happened when he was not compliant with his Keppra. Proceeding by rising sensation, nauseous,  He also complains of symptoms consistent with obstructive sleep apnea, had sleep study in January 2011, at [facility], by Dr. Kimberlee Nearing, there was evidence of mild obstructive sleep apnea, lowest SpO2 was 82%, he was given advise of losing weight, sleep on his side, he continued to have frequent snoring, wife has to shake him to stop snoring, start breathing almost every night  UPDATE December 19 2016: Last clinical visit was on December 20 2015, as the seizure was in July 10 2014 after he misses his Keppra dose, is now taking Keppra 500 mg twice a day.  He has been compliant with his CPAP machine, which help him sleep better,  REVIEW OF SYSTEMS: Out of a complete 14 system review of symptoms, the patient complains only of the following symptoms, and all other reviewed systems are negative.  See history of present illness  ALLERGIES: No Known  Allergies  HOME MEDICATIONS: Outpatient Medications Prior to Visit  Medication Sig Dispense Refill  . levETIRAcetam (KEPPRA) 500 MG tablet TAKE 1 TABLET (500 MG TOTAL) BY MOUTH 2 (TWO) TIMES DAILY. 60 tablet 11  . naproxen (NAPROSYN) 500 MG tablet Take 1 tablet (500 mg total) by mouth 2 (two) times daily as needed (pain). 40 tablet 1  . traMADol (ULTRAM) 50 MG tablet Take 1-2 tablets (50-100 mg total) by mouth every 6 (six) hours as needed for moderate pain or severe pain. 30 tablet 0  . amoxicillin (AMOXIL) 500 MG tablet Take 1 tablet (500 mg total) by mouth 2 (two) times daily. 14 tablet 0   No facility-administered medications prior to visit.     PAST MEDICAL HISTORY: Past Medical History:  Diagnosis Date  . Acute urinary retention 12/22/2011  . BPH (benign prostatic hyperplasia) 08/24/2013  . Gross hematuria 12/22/2011  . History of kidney stones   . History of urinary retention    12/2011  . Migraine   . Mild obstructive sleep apnea    PER STUDY 09-14-2009-uses CPAP  . Seizure disorder, grand mal ([place])    PER PT LAST SEIZURE 01/2013  . Seizures ([place])   . Ureteral calculus 12/22/2011    PAST SURGICAL HISTORY: Past Surgical History:  Procedure Laterality Date  . CYSTO/  RIGHT RETROGRADE PYELOGRAM/  URETEROSCOPY  12-23-2011  . EXCISION LIPOMA OF SCALP AND FACE  03-24-2005  . INGUINAL HERNIA REPAIR Bilateral 10/18/2016   Procedure: LAPAROSCOPIC EXPLORATION AND REPAIR OF BILATERAL  INGUINAL HERNIA WITH A TAPP REPAIR;  Surgeon: Michael Boston, MD;  Location: [facility];  Service: General;  Laterality: Bilateral;  . INSERTION OF MESH Bilateral 10/18/2016   Procedure: INSERTION OF MESH;  Surgeon: Michael Boston, MD;  Location: [facility];  Service: General;  Laterality: Bilateral;  . LAPAROSCOPIC LYSIS OF ADHESIONS  10/18/2016   Procedure: LAPAROSCOPIC LYSIS OF ADHESIONS;  Surgeon: Michael Boston, MD;  Location: [facility];  Service: General;;  . SHOULDER SURGERY Left 2009  . TRANSURETHRAL RESECTION OF  PROSTATE N/A 08/24/2013   Procedure: TRANSURETHRAL RESECTION OF THE PROSTATE WITH GYRUS INSTRUMENTS;  Surgeon: Bernestine Amass, MD;  Location[facility];  Service: Urology;  Laterality: N/A;    FAMILY HISTORY: No family history on file.  SOCIAL HISTORY: Social History   Social History  . Marital status: Married    Spouse name: N/A  . Number of children: 4  . Years of education: 12   Occupational History  . Taxi driver    Social History Main Topics  . Smoking status: Never Smoker  . Smokeless tobacco: Never Used  . Alcohol use No  . Drug use: No  . Sexual activity: Not on file   Other Topics Concern  . Not on file   Social History Narrative   Patient lives at home with his wife Sueanne Margarita)   Patient works as a Architect part time.   Right handed   Patient drinks one cup of tea per week.   Patient has four children.      Originally from [place] in [place]:   12/19/16 1413  BP: (!) 155/84  Pulse: (!) 57  Weight: 181 lb (82.1 kg)  Height: 5\' 8"  (1.727 m)   Body mass index is 27.52 kg/m.  Generalized: Well developed, in no acute distress   Neurological examination  Mentation: Alert oriented to time, place, history taking. Follows all commands speech and language fluent Cranial nerve II-XII: Pupils were equal round reactive to light. Extraocular movements were full, visual field were full on confrontational test. Facial sensation and strength were normal. Uvula tongue midline. Head turning and shoulder shrug  were normal and symmetric. Motor: The motor testing reveals 5 over 5 strength of all 4 extremities. Good symmetric motor tone is noted throughout.  Sensory: Sensory testing is intact to soft touch on all 4 extremities. No evidence of extinction is noted.  Coordination: Cerebellar testing reveals good finger-nose-finger and heel-to-shin bilaterally.  Gait and station: Gait is normal. Tandem gait is normal.  Romberg is negative. No drift is seen.  Reflexes: Deep tendon reflexes are symmetric and normal bilaterally.   DIAGNOSTIC DATA (LABS, IMAGING, TESTING) - I reviewed patient records, labs, notes, testing and imaging myself where available.   ASSESSMENT AND PLAN 51 y.o. year old male    Epilepsy  Last recurrent generalized seizure was July 10 2014  Keep Keppra 500 mg twice a day Obstructive sleep apnea  Emphasize importance of complying with his CPAP machine   Marcial Pacas, M.D. Ph.D.  [facility] [place], [place] [address] Phone: [phone] Fax:      [phone]

## 2016-12-20 ENCOUNTER — Ambulatory Visit (INDEPENDENT_AMBULATORY_CARE_PROVIDER_SITE_OTHER): Payer: Medicaid Other | Admitting: Specialist

## 2016-12-20 ENCOUNTER — Encounter (INDEPENDENT_AMBULATORY_CARE_PROVIDER_SITE_OTHER): Payer: Self-pay | Admitting: Specialist

## 2016-12-20 VITALS — BP 170/100 | HR 48 | Ht 68.0 in | Wt 185.0 lb

## 2016-12-20 DIAGNOSIS — M5412 Radiculopathy, cervical region: Secondary | ICD-10-CM

## 2016-12-20 DIAGNOSIS — R1031 Right lower quadrant pain: Secondary | ICD-10-CM

## 2016-12-20 DIAGNOSIS — G5621 Lesion of ulnar nerve, right upper limb: Secondary | ICD-10-CM | POA: Diagnosis not present

## 2016-12-20 DIAGNOSIS — M47816 Spondylosis without myelopathy or radiculopathy, lumbar region: Secondary | ICD-10-CM

## 2016-12-20 NOTE — Progress Notes (Signed)
Office Visit Note   Patient: Francis Jackson           Date of Birth: [birth year]           MRN: [id number] Visit Date: 12/20/2016              Requested by: Nolene Ebbs, MD [address] [place], [place] [address] PCP: Philis Fendt, MD   Assessment & Plan: Visit Diagnoses:  1. Spondylosis without myelopathy or radiculopathy, lumbar region   2. Radiculopathy, cervical region   3. Right lower quadrant abdominal pain   Status post hernia repair by Dr. Johney Maine February 2018  Plan:  Patient needs to have ongoing issues with his cervical spine and we have previously filled out preoperative paperwork to do left C5-6 and left C6-7 foraminotomies. Patient will need to be in the prone position for at least 2-3 hours possibly. With his ongoing abdominal pain after hernia repair he will need to see his general surgeon for evaluation. I did call Dr. gross and spoke with his assistant and she stated that she will call patient to have him come in for evaluation of his ongoing abdominal pain.  With his ongoing lumbar spine issues we'll schedule repeat bilateral L4 transforaminal ESI's with Dr. Ernestina Patches. Follow-up with Dr.nitka in 6 weeks for recheck. Right forearm and ulnar hand numbness and tingling ongoing 3 weeks and we'll see if this resolves. Consider getting a NCV/EMG study if not improved at follow-up visit.  Follow-Up Instructions: Return in about 6 weeks (around 01/31/2017) for recheck for neck and back pain.   Orders:  Orders Placed This Encounter  Procedures  . Ambulatory referral to Physical Medicine Rehab   No orders of the defined types were placed in this encounter.     Procedures: No procedures performed   Clinical Data: No additional findings.   Subjective: Chief Complaint  Patient presents with  . Neck - Follow-up  . Lower Back - Follow-up, Pain    HPI Patient returns for follow up of neck pain and low back pain. Continues to have ongoing neck pain and left upper  extremity radiculopathy. We have been waiting for clearance to do left C5-6 and C6-7 foraminotomies by Dr. gross after having hernia repair every 2018. Patient continues to complain of abdominal pain that is not getting any better. Mostly in the right lower quadrant. Denies fever chills nausea vomiting. He also continues to complain of low back pain with known history of lumbar spondylosis. He had bilateral L4 transforaminal ESI's June 2017 that did get some improvement for a while. Our plans were to treat his neck which was more severe before further addressing his back. No complaints of bowel or bladder incontinence. For about 3 weeks patient is also been complaining of having intermittent numbness and tingling starting at the right medial elbow extending down his forearm into the ulnar aspect of his hand. Aggravated when he applies direct pressure over the medial elbow. No injury. Review of Systems  Constitutional: Negative.   Respiratory: Negative.   Cardiovascular: Negative.   Gastrointestinal: Positive for abdominal pain.  Genitourinary: Negative.   Musculoskeletal: Positive for back pain, myalgias, neck pain and neck stiffness.  Neurological: Positive for seizures and numbness. Negative for weakness.     Objective: Vital Signs: BP (!) 170/100 (BP Location: Left Arm, Patient Position: Sitting)   Pulse (!) 48   Ht 5\' 8"  (1.727 m)   Wt 185 lb (83.9 kg)   BMI 28.13 kg/m  Physical Exam  Constitutional: He is oriented to person, place, and time. No distress.  HENT:  Head: Normocephalic and atraumatic.  Eyes: EOM are normal.  Neck:  Trapezius tender. Good cervical spine range of motion.  Abdominal: There is tenderness (Diffuse but more in the right lower quadrant.).  Musculoskeletal:  No focal motor deficits. Neurovascularly intact. Positive Tinel's over the bilateral cubital tunnels. Right elbow positive flexion test.  Neurological: He is alert and oriented to person, place, and time.   Skin: Skin is warm and dry.  Psychiatric: He has a normal mood and affect.    Ortho Exam  Specialty Comments:  No specialty comments available.  Imaging: No results found.   PMFS History: Patient Active Problem List   Diagnosis Date Noted  . Bilateral inguinal hernia (BIH) s/p lap repair w mesh 10/18/2016 10/18/2016  . OSA on CPAP 05/07/2014  . Seizures ([place]) 11/03/2013  . BPH with obstruction/lower urinary tract symptoms 08/24/2013   Past Medical History:  Diagnosis Date  . Acute urinary retention 12/22/2011  . BPH (benign prostatic hyperplasia) 08/24/2013  . Gross hematuria 12/22/2011  . History of kidney stones   . History of urinary retention    12/2011  . Migraine   . Mild obstructive sleep apnea    PER STUDY 09-14-2009-uses CPAP  . Seizure disorder, grand mal ([place])    PER PT LAST SEIZURE 01/2013  . Seizures ([place])   . Ureteral calculus 12/22/2011    No family history on file.  Past Surgical History:  Procedure Laterality Date  . CYSTO/  RIGHT RETROGRADE PYELOGRAM/  URETEROSCOPY  12-23-2011  . EXCISION LIPOMA OF SCALP AND FACE  03-24-2005  . INGUINAL HERNIA REPAIR Bilateral 10/18/2016   Procedure: LAPAROSCOPIC EXPLORATION AND REPAIR OF BILATERAL INGUINAL HERNIA WITH A TAPP REPAIR;  Surgeon: Michael Boston, MD;  [facility];  Service: General;  Laterality: Bilateral;  . INSERTION OF MESH Bilateral 10/18/2016   Procedure: INSERTION OF MESH;  Surgeon: Michael Boston, MD;  [facility];  Service: General;  Laterality: Bilateral;  . LAPAROSCOPIC LYSIS OF ADHESIONS  10/18/2016   Procedure: LAPAROSCOPIC LYSIS OF ADHESIONS;  Surgeon: Michael Boston, MD;  [facility];  Service: General;;  . SHOULDER SURGERY Left 2009  . TRANSURETHRAL RESECTION OF PROSTATE N/A 08/24/2013   Procedure: TRANSURETHRAL RESECTION OF THE PROSTATE WITH GYRUS INSTRUMENTS;  Surgeon: Bernestine Amass, MD;  [facility];  Service: Urology;  Laterality: N/A;   Social History    Occupational History  . Taxi driver    Social History Main Topics  . Smoking status: Never Smoker  . Smokeless tobacco: Never Used  . Alcohol use No  . Drug use: No  . Sexual activity: Not on file

## 2016-12-31 ENCOUNTER — Encounter (INDEPENDENT_AMBULATORY_CARE_PROVIDER_SITE_OTHER): Payer: Self-pay | Admitting: Physical Medicine and Rehabilitation

## 2016-12-31 ENCOUNTER — Ambulatory Visit (INDEPENDENT_AMBULATORY_CARE_PROVIDER_SITE_OTHER): Payer: Medicaid Other

## 2016-12-31 ENCOUNTER — Ambulatory Visit (INDEPENDENT_AMBULATORY_CARE_PROVIDER_SITE_OTHER): Payer: Medicaid Other | Admitting: Physical Medicine and Rehabilitation

## 2016-12-31 VITALS — BP 137/89 | HR 55

## 2016-12-31 DIAGNOSIS — M5416 Radiculopathy, lumbar region: Secondary | ICD-10-CM

## 2016-12-31 DIAGNOSIS — M48062 Spinal stenosis, lumbar region with neurogenic claudication: Secondary | ICD-10-CM

## 2016-12-31 MED ORDER — METHYLPREDNISOLONE ACETATE 80 MG/ML IJ SUSP
80.0000 mg | Freq: Once | INTRAMUSCULAR | Status: AC
  Administered 2016-12-31: 80 mg

## 2016-12-31 MED ORDER — LIDOCAINE HCL (PF) 1 % IJ SOLN
0.3300 mL | Freq: Once | INTRAMUSCULAR | Status: AC
  Administered 2016-12-31: 0.3 mL

## 2016-12-31 NOTE — Patient Instructions (Signed)

## 2016-12-31 NOTE — Progress Notes (Signed)
Francis Jackson - 51 y.o. male MRN [id number]  Date of birth: [birth year]  Office Visit Note: Visit Date: 12/31/2016 PCP: Philis Fendt, MD Referred by: Nolene Ebbs, MD  Subjective: Chief Complaint  Patient presents with  . Lower Back - Pain   HPI: [name] is a 35 -year-old gentleman who is followed by Dr. Louanne Skye. He has severe multifactorial stenosis at L4-5. He is having chronic worsening severe low back pain with some referral in the hip and leg. Dr. Louanne Skye requests diagnostic of therapeutic bilateral L4 transforaminal epidural steroid injection.    ROS Otherwise per HPI.  Assessment & Plan: Visit Diagnoses:  1. Lumbar radiculopathy   2. Spinal stenosis of lumbar region with neurogenic claudication     Plan: No additional findings.   Meds & Orders:  Meds ordered this encounter  Medications  . lidocaine (PF) (XYLOCAINE) 1 % injection 0.3 mL  . methylPREDNISolone acetate (DEPO-MEDROL) injection 80 mg    Orders Placed This Encounter  Procedures  . XR C-ARM NO REPORT  . Epidural Steroid injection    Follow-up: Return for Dr. Louanne Skye.   Procedures: No procedures performed  Lumbosacral Transforaminal Epidural Steroid Injection - Infraneural Approach with Fluoroscopic Guidance  Patient: Francis Jackson      Date of Birth: 08/27/1966 MRN: [id number] PCP: Philis Fendt, MD      Visit Date: 12/31/2016   Universal Protocol:    Date/Time: 04/23/188:14 AM  Consent Given By: the patient  Position: PRONE   Additional Comments: Vital signs were monitored before and after the procedure. Patient was prepped and draped in the usual sterile fashion. The correct patient, procedure, and site was verified.   Injection Procedure Details:  Procedure Site One Meds Administered:  Meds ordered this encounter  Medications  . lidocaine (PF) (XYLOCAINE) 1 % injection 0.3 mL  . methylPREDNISolone acetate (DEPO-MEDROL) injection 80 mg      Laterality:  Bilateral  Location/Site:  L4-L5  Needle size: 22 G  Needle type: Spinal  Needle Placement: Transforaminal  Findings:  -Contrast Used: 1 mL iohexol 180 mg iodine/mL   -Comments: Excellent flow of contrast along the nerve and into the epidural space.  Procedure Details: After squaring off the end-plates of the desired vertebral level to get a true AP view, the C-arm was obliqued to the painful side so that the superior articulating process is positioned about 1/3 the length of the inferior endplate.  The needle was aimed toward the junction of the superior articular process and the transverse process of the inferior vertebrae. The needle's initial entry is in the lower third of the foramen through Kambin's triangle. The soft tissues overlying this target were infiltrated with 2-3 ml. of 1% Lidocaine without Epinephrine.  The spinal needle was then inserted and advanced toward the target using a "trajectory" view along the fluoroscope beam.  Under AP and lateral visualization, the needle was advanced so it did not puncture dura and did not traverse medially beyond the 6 o'clock position of the pedicle. Bi-planar projections were used to confirm position. Aspiration was confirmed to be negative for CSF and/or blood. A 1-2 ml. volume of Isovue-250 was injected and flow of contrast was noted at each level. Radiographs were obtained for documentation purposes.   After attaining the desired flow of contrast documented above, a 0.5 to 1.0 ml test dose of 0.25% Marcaine was injected into each respective transforaminal space.  The patient was observed for 90 seconds post injection.  After  no sensory deficits were reported, and normal lower extremity motor function was noted,   the above injectate was administered so that equal amounts of the injectate were placed at each foramen (level) into the transforaminal epidural space.   Additional Comments:  The patient tolerated the procedure well Dressing:  Band-Aid    Post-procedure details: Patient was observed during the procedure. Post-procedure instructions were reviewed.  Patient left the clinic in stable condition.   Clinical History: Lumbar Spine MRI 01/20/2016 IMPRESSION: 1. At L4-5 there is a moderate broad-based disc bulge. Mild bilateral facet arthropathy. Severe spinal stenosis. Mild bilateral foraminal stenosis. 2. At L3-4 there is a moderate broad-based disc bulge. Mild bilateral facet arthropathy. Moderate -severe spinal stenosis. 3. At L5-S1 there is a mild broad-based disc bulge. Mild bilateral facet arthropathy. Mild spinal stenosis. Moderate bilateral foraminal stenosis. 4. Overall no significant interval change.  He reports that he has never smoked. He has never used smokeless tobacco. No results for input(s): HGBA1C, LABURIC in the last 8760 hours.  Objective:  VS:  HT:    WT:   BMI:     BP:137/89  HR:(!) 55bpm  TEMP: ( )  RESP:100 % Physical Exam  Musculoskeletal:  The patient ambulates without aid with a forward flexed spine with good distal strength.    Ortho Exam Imaging: Xr C-arm No Report  Result Date: 12/31/2016 Please see Notes or Procedures tab for imaging impression.   Past Medical/Family/Surgical/Social History: Medications & Allergies reviewed per EMR Patient Active Problem List   Diagnosis Date Noted  . Bilateral inguinal hernia (BIH) s/p lap repair w mesh 10/18/2016 10/18/2016  . OSA on CPAP 05/07/2014  . Seizures ([place]) 11/03/2013  . BPH with obstruction/lower urinary tract symptoms 08/24/2013   Past Medical History:  Diagnosis Date  . Acute urinary retention 12/22/2011  . BPH (benign prostatic hyperplasia) 08/24/2013  . Gross hematuria 12/22/2011  . History of kidney stones   . History of urinary retention    12/2011  . Migraine   . Mild obstructive sleep apnea    PER STUDY 09-14-2009-uses CPAP  . Seizure disorder, grand mal ([place])    PER PT LAST SEIZURE 01/2013  . Seizures  ([place])   . Ureteral calculus 12/22/2011   History reviewed. No pertinent family history. Past Surgical History:  Procedure Laterality Date  . CYSTO/  RIGHT RETROGRADE PYELOGRAM/  URETEROSCOPY  12-23-2011  . EXCISION LIPOMA OF SCALP AND FACE  03-24-2005  . INGUINAL HERNIA REPAIR Bilateral 10/18/2016   Procedure: LAPAROSCOPIC EXPLORATION AND REPAIR OF BILATERAL INGUINAL HERNIA WITH A TAPP REPAIR;  Surgeon: Michael Boston, MD;  [facility];  Service: General;  Laterality: Bilateral;  . INSERTION OF MESH Bilateral 10/18/2016   Procedure: INSERTION OF MESH;  Surgeon: Michael Boston, MD;  [facility];  Service: General;  Laterality: Bilateral;  . LAPAROSCOPIC LYSIS OF ADHESIONS  10/18/2016   Procedure: LAPAROSCOPIC LYSIS OF ADHESIONS;  Surgeon: Michael Boston, MD;  [facility];  Service: General;;  . SHOULDER SURGERY Left 2009  . TRANSURETHRAL RESECTION OF PROSTATE N/A 08/24/2013   Procedure: TRANSURETHRAL RESECTION OF THE PROSTATE WITH GYRUS INSTRUMENTS;  Surgeon: Bernestine Amass, MD;  Location: [facility];  Service: Urology;  Laterality: N/A;   Social History   Occupational History  . Taxi driver    Social History Main Topics  . Smoking status: Never Smoker  . Smokeless tobacco: Never Used  . Alcohol use No  . Drug use: No  . Sexual activity:  Not on file

## 2016-12-31 NOTE — Procedures (Signed)
Lumbosacral Transforaminal Epidural Steroid Injection - Infraneural Approach with Fluoroscopic Guidance  Patient: Francis Jackson      Date of Birth: 07-23-1966 MRN: [id number] PCP: Philis Fendt, MD      Visit Date: 12/31/2016   Universal Protocol:    Date/Time: 04/23/188:14 AM  Consent Given By: the patient  Position: PRONE   Additional Comments: Vital signs were monitored before and after the procedure. Patient was prepped and draped in the usual sterile fashion. The correct patient, procedure, and site was verified.   Injection Procedure Details:  Procedure Site One Meds Administered:  Meds ordered this encounter  Medications  . lidocaine (PF) (XYLOCAINE) 1 % injection 0.3 mL  . methylPREDNISolone acetate (DEPO-MEDROL) injection 80 mg      Laterality: Bilateral  Location/Site:  L4-L5  Needle size: 22 G  Needle type: Spinal  Needle Placement: Transforaminal  Findings:  -Contrast Used: 1 mL iohexol 180 mg iodine/mL   -Comments: Excellent flow of contrast along the nerve and into the epidural space.  Procedure Details: After squaring off the end-plates of the desired vertebral level to get a true AP view, the C-arm was obliqued to the painful side so that the superior articulating process is positioned about 1/3 the length of the inferior endplate.  The needle was aimed toward the junction of the superior articular process and the transverse process of the inferior vertebrae. The needle's initial entry is in the lower third of the foramen through Kambin's triangle. The soft tissues overlying this target were infiltrated with 2-3 ml. of 1% Lidocaine without Epinephrine.  The spinal needle was then inserted and advanced toward the target using a "trajectory" view along the fluoroscope beam.  Under AP and lateral visualization, the needle was advanced so it did not puncture dura and did not traverse medially beyond the 6 o'clock position of the pedicle. Bi-planar  projections were used to confirm position. Aspiration was confirmed to be negative for CSF and/or blood. A 1-2 ml. volume of Isovue-250 was injected and flow of contrast was noted at each level. Radiographs were obtained for documentation purposes.   After attaining the desired flow of contrast documented above, a 0.5 to 1.0 ml test dose of 0.25% Marcaine was injected into each respective transforaminal space.  The patient was observed for 90 seconds post injection.  After no sensory deficits were reported, and normal lower extremity motor function was noted,   the above injectate was administered so that equal amounts of the injectate were placed at each foramen (level) into the transforaminal epidural space.   Additional Comments:  The patient tolerated the procedure well Dressing: Band-Aid    Post-procedure details: Patient was observed during the procedure. Post-procedure instructions were reviewed.  Patient left the clinic in stable condition.

## 2016-12-31 NOTE — Progress Notes (Deleted)
Pain across lower back. Right=left.

## 2017-01-07 ENCOUNTER — Other Ambulatory Visit: Payer: Self-pay | Admitting: Surgery

## 2017-01-08 ENCOUNTER — Other Ambulatory Visit (HOSPITAL_COMMUNITY): Payer: Self-pay | Admitting: Surgery

## 2017-01-08 DIAGNOSIS — R1033 Periumbilical pain: Secondary | ICD-10-CM

## 2017-01-16 ENCOUNTER — Encounter (HOSPITAL_COMMUNITY): Payer: Self-pay

## 2017-01-16 ENCOUNTER — Ambulatory Visit (HOSPITAL_COMMUNITY)
Admission: RE | Admit: 2017-01-16 | Discharge: 2017-01-16 | Disposition: A | Discharge status: Home / Self Care | Payer: Medicaid Other | Source: Ambulatory Visit | Attending: Surgery | Admitting: Surgery

## 2017-01-16 DIAGNOSIS — K760 Fatty (change of) liver, not elsewhere classified: Secondary | ICD-10-CM | POA: Diagnosis not present

## 2017-01-16 DIAGNOSIS — M48061 Spinal stenosis, lumbar region without neurogenic claudication: Secondary | ICD-10-CM | POA: Diagnosis not present

## 2017-01-16 DIAGNOSIS — Z9889 Other specified postprocedural states: Secondary | ICD-10-CM | POA: Diagnosis not present

## 2017-01-16 DIAGNOSIS — R1033 Periumbilical pain: Secondary | ICD-10-CM | POA: Insufficient documentation

## 2017-01-16 DIAGNOSIS — N3090 Cystitis, unspecified without hematuria: Secondary | ICD-10-CM | POA: Diagnosis not present

## 2017-01-16 DIAGNOSIS — N420 Calculus of prostate: Secondary | ICD-10-CM | POA: Diagnosis not present

## 2017-01-16 MED ORDER — IOPAMIDOL (ISOVUE-300) INJECTION 61%
INTRAVENOUS | Status: AC
  Administered 2017-01-16: 100 mL
  Filled 2017-01-16: qty 100

## 2017-02-05 ENCOUNTER — Ambulatory Visit (INDEPENDENT_AMBULATORY_CARE_PROVIDER_SITE_OTHER): Payer: Medicaid Other | Admitting: Specialist

## 2017-02-05 ENCOUNTER — Encounter (INDEPENDENT_AMBULATORY_CARE_PROVIDER_SITE_OTHER): Payer: Self-pay | Admitting: Specialist

## 2017-02-05 ENCOUNTER — Encounter (INDEPENDENT_AMBULATORY_CARE_PROVIDER_SITE_OTHER): Payer: Self-pay

## 2017-02-05 VITALS — BP 161/87 | HR 81 | Ht 68.0 in | Wt 185.0 lb

## 2017-02-05 DIAGNOSIS — R202 Paresthesia of skin: Secondary | ICD-10-CM | POA: Diagnosis not present

## 2017-02-05 DIAGNOSIS — R200 Anesthesia of skin: Secondary | ICD-10-CM | POA: Diagnosis not present

## 2017-02-05 DIAGNOSIS — M4722 Other spondylosis with radiculopathy, cervical region: Secondary | ICD-10-CM

## 2017-02-05 DIAGNOSIS — M48062 Spinal stenosis, lumbar region with neurogenic claudication: Secondary | ICD-10-CM

## 2017-02-05 MED ORDER — TRAMADOL HCL 50 MG PO TABS: 50.0000 mg | ORAL_TABLET | Freq: Four times a day (QID) | ORAL | 0 refills | Status: DC | PRN

## 2017-02-05 MED ORDER — METHYLPREDNISOLONE 4 MG PO TBPK: ORAL_TABLET | ORAL | 0 refills | Status: DC

## 2017-02-05 NOTE — Progress Notes (Addendum)
Office Visit Note   Patient: Francis Jackson           Date of Birth: [birth year]           MRN: [id number] Visit Date: 02/05/2017              Requested by: Nolene Ebbs, MD [address] [place], [place] [address] PCP: Nolene Ebbs, MD   Assessment & Plan: Visit Diagnoses:  1. Other spondylosis with radiculopathy, cervical region   2. Spinal stenosis of lumbar region with neurogenic claudication   3. Numbness and tingling in left arm     Plan: Avoid overhead lifting and overhead use of the arms. Do not lift greater than 5 lbs. Adjust head rest in vehicle to prevent hyperextension if rear ended. Avoid bending, stooping and avoid lifting weights greater than 10 lbs. Avoid prolong standing and walking. Avoid frequent bending and stooping   May use ice or moist heat for pain. Weight loss is of benefit.     Follow-Up Instructions: Return in about 3 weeks (around 02/26/2017).   Orders:  Orders Placed This Encounter  Procedures  . MR Cervical Spine w/o contrast  . Ambulatory referral to Physical Medicine Rehab   Meds ordered this encounter  Medications  . methylPREDNISolone (MEDROL DOSEPAK) 4 MG TBPK tablet    Sig: Take as directed    Dispense:  21 tablet    Refill:  0  . traMADol (ULTRAM) 50 MG tablet    Sig: Take 1-2 tablets (50-100 mg total) by mouth every 6 (six) hours as needed for moderate pain or severe pain.    Dispense:  30 tablet    Refill:  0      Procedures: No procedures performed   Clinical Data: Findings:  MRI 01/2016 with left foramenal stenosis C6-7 and upper neck right C3-4 EMGs/NCV with left C7-8 chronic radiculopathy.    Subjective: Chief Complaint  Patient presents with  . Neck - Follow-up  . Lower Back - Follow-up    He had Bilateral L4-5 Transforminal injection with Dr. Ernestina Patches on 12/31/2016--Says it helped a little bit.    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 Pain is into the left anterior chest into the left posterior scapula. Associated numbness and paresthesias into the left ulnar 2 digits. Complaints of severe pain, worsened with extension of the neck. No bowel or bladder difficulties. Lower back and leg pain is better post ESIs By Dr. Ernestina Patches. Taking tramadol BID  For both the low back and the neck.     Review of Systems  Constitutional: Negative.   HENT: Negative.   Eyes: Negative.   Respiratory: Negative.   Cardiovascular: Negative.   Gastrointestinal: Negative.   Endocrine: Negative.   Genitourinary: Negative.   Musculoskeletal: Negative.   Skin: Negative.   Allergic/Immunologic: Negative.   Neurological: Negative.   Hematological: Negative.   Psychiatric/Behavioral: Negative.      Objective: Vital Signs: BP (!) 161/87 (BP Location: Left Arm, Patient Position: Sitting)   Pulse 81   Ht 5\' 8"  (1.727 m)   Wt 185 lb (83.9 kg)   BMI 28.13 kg/m   Physical Exam  Constitutional: He is oriented to person, place, and time. He appears well-developed and well-nourished.  HENT:  Head: Normocephalic and atraumatic.  Eyes: EOM are normal. Pupils are equal, round, and reactive to light.  Neck: Normal range of motion.  Neck supple.  Pulmonary/Chest: Effort normal and breath sounds normal.  Abdominal: Soft. Bowel sounds are normal.  Neurological: He is alert and oriented to person, place, and time.  Skin: Skin is warm and dry.  Psychiatric: He has a normal mood and affect. His behavior is normal. Judgment and thought content normal.    Back Exam   Tenderness  The patient is experiencing tenderness in the cervical.  Range of Motion  Extension: abnormal  Flexion: abnormal  Lateral Bend Right: abnormal  Lateral Bend Left: abnormal  Rotation Right: abnormal  Rotation Left: abnormal   Tests  Straight leg raise right: negative Straight leg raise left: negative  Reflexes  Patellar:  normal Achilles: normal Biceps: normal Babinski's sign: normal   Other  Heel Walk: normal Sensation: normal Gait: normal   Comments:  Decreased left lateral bending and rotation of the cervical spine to left. Postive spurling sign. Motor intact  Pain pattern is left C8 into the left ulnar 2 digits. Positive hyperflexion test left elbow.      Specialty Comments:  No specialty comments available.  Imaging: No results found.   PMFS History: Patient Active Problem List   Diagnosis Date Noted  . Bilateral inguinal hernia (BIH) s/p lap repair w mesh 10/18/2016 10/18/2016  . OSA on CPAP 05/07/2014  . Seizures ([place]) 11/03/2013  . BPH with obstruction/lower urinary tract symptoms 08/24/2013   Past Medical History:  Diagnosis Date  . Acute urinary retention 12/22/2011  . BPH (benign prostatic hyperplasia) 08/24/2013  . Gross hematuria 12/22/2011  . History of kidney stones   . History of urinary retention    12/2011  . Migraine   . Mild obstructive sleep apnea    PER STUDY 09-14-2009-uses CPAP  . Seizure disorder, grand mal ([place])    PER PT LAST SEIZURE 01/2013  . Seizures ([place])   . Ureteral calculus 12/22/2011    No family history on file.  Past Surgical History:  Procedure Laterality Date  . CYSTO/  RIGHT RETROGRADE PYELOGRAM/  URETEROSCOPY  12-23-2011  . EXCISION LIPOMA OF SCALP AND FACE  03-24-2005  . INGUINAL HERNIA REPAIR Bilateral 10/18/2016   Procedure: LAPAROSCOPIC EXPLORATION AND REPAIR OF BILATERAL INGUINAL HERNIA WITH A TAPP REPAIR;  Surgeon: Michael Boston, MD;  Location: [facility];  Service: General;  Laterality: Bilateral;  . INSERTION OF MESH Bilateral 10/18/2016   Procedure: INSERTION OF MESH;  Surgeon: Michael Boston, MD;  Location: [facility];  Service: General;  Laterality: Bilateral;  . LAPAROSCOPIC LYSIS OF ADHESIONS  10/18/2016   Procedure: LAPAROSCOPIC LYSIS OF ADHESIONS;  Surgeon: Michael Boston, MD;  Location: [facility];  Service: General;;  . SHOULDER SURGERY Left 2009   . TRANSURETHRAL RESECTION OF PROSTATE N/A 08/24/2013   Procedure: TRANSURETHRAL RESECTION OF THE PROSTATE WITH GYRUS INSTRUMENTS;  Surgeon: Bernestine Amass, MD;  Location: [facility];  Service: Urology;  Laterality: N/A;   Social History   Occupational History  . Taxi driver    Social History Main Topics  . Smoking status: Never Smoker  . Smokeless tobacco: Never Used  . Alcohol use No  . Drug use: No  . Sexual activity: Not on file

## 2017-02-05 NOTE — Patient Instructions (Addendum)
Plan: Avoid overhead lifting and overhead use of the arms. Do not lift greater than 5 lbs. Adjust head rest in vehicle to prevent hyperextension if rear ended. Avoid bending, stooping and avoid lifting weights greater than 10 lbs. Avoid prolong standing and walking. Avoid frequent bending and stooping  Handicap sticker approved. May use ice or moist heat for pain. Weight loss is of benefit. Start medrol dose pak, stop any NSAIDs (motrin or naprosyn or diclofenac or mobic) while taking the medrol dose pak. A repeat MRI of the cervical spine is being scheduled. Repeat EMG/NCV of the left arm are also being scheduled.

## 2017-02-06 ENCOUNTER — Ambulatory Visit (INDEPENDENT_AMBULATORY_CARE_PROVIDER_SITE_OTHER): Payer: Medicaid Other | Admitting: Specialist

## 2017-02-19 ENCOUNTER — Encounter (INDEPENDENT_AMBULATORY_CARE_PROVIDER_SITE_OTHER): Payer: Medicaid Other | Admitting: Physical Medicine and Rehabilitation

## 2017-02-20 ENCOUNTER — Telehealth (INDEPENDENT_AMBULATORY_CARE_PROVIDER_SITE_OTHER): Payer: Self-pay | Admitting: Physical Medicine and Rehabilitation

## 2017-02-20 NOTE — Telephone Encounter (Signed)
lvm for pt to call back to r/s  

## 2017-02-20 NOTE — Telephone Encounter (Signed)
Patient request call to r/s appointment he missed on 02/19/17.

## 2017-03-01 ENCOUNTER — Ambulatory Visit
Admission: RE | Admit: 2017-03-01 | Discharge: 2017-03-01 | Disposition: A | Discharge status: Home / Self Care | Payer: Medicaid Other | Source: Ambulatory Visit | Attending: Specialist | Admitting: Specialist

## 2017-03-01 DIAGNOSIS — M48062 Spinal stenosis, lumbar region with neurogenic claudication: Secondary | ICD-10-CM

## 2017-03-01 DIAGNOSIS — R202 Paresthesia of skin: Secondary | ICD-10-CM

## 2017-03-01 DIAGNOSIS — R200 Anesthesia of skin: Secondary | ICD-10-CM

## 2017-03-01 DIAGNOSIS — M4722 Other spondylosis with radiculopathy, cervical region: Secondary | ICD-10-CM

## 2017-03-07 ENCOUNTER — Ambulatory Visit (INDEPENDENT_AMBULATORY_CARE_PROVIDER_SITE_OTHER): Payer: Medicaid Other | Admitting: Physical Medicine and Rehabilitation

## 2017-03-07 ENCOUNTER — Encounter (INDEPENDENT_AMBULATORY_CARE_PROVIDER_SITE_OTHER): Payer: Self-pay | Admitting: Physical Medicine and Rehabilitation

## 2017-03-07 DIAGNOSIS — R202 Paresthesia of skin: Secondary | ICD-10-CM | POA: Diagnosis not present

## 2017-03-07 NOTE — Progress Notes (Deleted)
Left shoulder pain now radiating down to fifth finger. Numbness in fourth and fifth fingers. Sometimes feels it is worse with driving. Relieved by holding arm up. Right hand dominant.

## 2017-03-08 NOTE — Progress Notes (Signed)
Francis Jackson - 51 y.o. male MRN [id number]  Date of birth: Sep 10, 1966  Office Visit Note: Visit Date: 03/07/2017 PCP: Nolene Ebbs, MD Referred by: Nolene Ebbs, MD  Subjective: Chief Complaint  Patient presents with  . Left Arm - Pain, Numbness   HPI: Francis Jackson is a 51 year old right-hand dominant male with chronic worsening history of neck and lumbar pain. He reports left shoulder pain radiating to the fifth digit. He reports numbness and tingling in the fourth and fifth fingers. He thinks it's worse with driving. It seems to be relieved by holding his arm up in the air. He doesn't get specifically nocturnal complaints. He has no numbness and tingling in the radial digits. He has no prior history of electrodiagnostic studies. He has had a recent cervical spine MRI which is reviewed below. This shows mainly by foraminal narrowing without significant central canal stenosis or large disc herniation. He had a prior cervical MRI in 2017 with overall no significant change. He has a history of epilepsy and iscurrently taking Keppra.    ROS Otherwise per HPI.  Assessment & Plan: Visit Diagnoses:  1. Paresthesia of skin     Plan: No additional findings.  Impression: The above electrodiagnostic study is ABNORMAL and reveals evidence suggestive of mild chronic C7 and C8 radiculopathy on the right.    There is no significant electrodiagnostic evidence of any other focal nerve entrapment, brachial plexopathy or generalized peripheral neuropathy.   Recommendations: 1.  Follow-up with referring physician. 2.  Continue current management of symptoms.  Meds & Orders: No orders of the defined types were placed in this encounter.   Orders Placed This Encounter  Procedures  . NCV with EMG (electromyography)    Follow-up: Return for Dr. Louanne Skye.   Procedures: No procedures performed  EMG & NCV Findings: All nerve conduction studies (as indicated in the following tables) were within  normal limits.    Needle evaluation of the left first dorsal interosseous and the left extensor digitorum communis muscles showed increased insertional activity.  All remaining muscles (as indicated in the following table) showed no evidence of electrical instability.    Impression: The above electrodiagnostic study is ABNORMAL and reveals evidence suggestive of mild chronic C7 and C8 radiculopathy on the right.    There is no significant electrodiagnostic evidence of any other focal nerve entrapment, brachial plexopathy or generalized peripheral neuropathy.   Recommendations: 1.  Follow-up with referring physician. 2.  Continue current management of symptoms.   Nerve Conduction Studies Anti Sensory Summary Table   Stim Site NR Peak (ms) Norm Peak (ms) P-T Amp (V) Norm P-T Amp Site1 Site2 Delta-P (ms) Dist (cm) Vel (m/s) Norm Vel (m/s)  Left Median Acr Palm Anti Sensory (2nd Digit)  31.5C  Wrist    3.2 <3.6 24.2 >10 Wrist Palm 1.3 0.0    Palm    1.9 <2.0 15.3         Left Radial Anti Sensory (Base 1st Digit)  30.8C  Wrist    1.7 <3.1 7.5  Wrist Base 1st Digit 1.7 0.0    Left Ulnar Anti Sensory (5th Digit)  31.6C  Wrist    3.0 <3.7 21.2 >15.0 Wrist 5th Digit 3.0 14.0 47 >38   Motor Summary Table   Stim Site NR Onset (ms) Norm Onset (ms) O-P Amp (mV) Norm O-P Amp Site1 Site2 Delta-0 (ms) Dist (cm) Vel (m/s) Norm Vel (m/s)  Left Median Motor (Abd Poll Brev)  30.6C  Wrist  3.2 <4.2 11.0 >5 Elbow Wrist 4.6 26.5 58 >50  Elbow    7.8  11.1         Left Ulnar Motor (Abd Dig Min)  30.7C  Wrist    2.8 <4.2 8.7 >3 B Elbow Wrist 4.6 27.0 59 >53  B Elbow    7.4  8.0  A Elbow B Elbow 1.5 10.0 67 >53  A Elbow    8.9  8.2          EMG   Side Muscle Nerve Root Ins Act Fibs Psw Amp Dur Poly Recrt Int Fraser Din Comment  Left 1stDorInt Ulnar C8-T1 *Incr Nml Nml Nml Nml 0 Nml Nml   Left Abd Poll Brev Median C8-T1 Nml Nml Nml Nml Nml 0 Nml Nml   Left ExtDigCom   *Incr Nml Nml Nml Nml 0 Nml Nml    Left Triceps Radial C6-7-8 Nml Nml Nml Nml Nml 0 Nml Nml   Left Deltoid Axillary C5-6 Nml Nml Nml Nml Nml 0 Nml Nml     Nerve Conduction Studies Anti Sensory Left/Right Comparison   Stim Site L Lat (ms) R Lat (ms) L-R Lat (ms) L Amp (V) R Amp (V) L-R Amp (%) Site1 Site2 L Vel (m/s) R Vel (m/s) L-R Vel (m/s)  Median Acr Palm Anti Sensory (2nd Digit)  31.5C  Wrist 3.2   24.2   Wrist Palm     Palm 1.9   15.3         Radial Anti Sensory (Base 1st Digit)  30.8C  Wrist 1.7   7.5   Wrist Base 1st Digit     Ulnar Anti Sensory (5th Digit)  31.6C  Wrist 3.0   21.2   Wrist 5th Digit 47     Motor Left/Right Comparison   Stim Site L Lat (ms) R Lat (ms) L-R Lat (ms) L Amp (mV) R Amp (mV) L-R Amp (%) Site1 Site2 L Vel (m/s) R Vel (m/s) L-R Vel (m/s)  Median Motor (Abd Poll Brev)  30.6C  Wrist 3.2   11.0   Elbow Wrist 58    Elbow 7.8   11.1         Ulnar Motor (Abd Dig Min)  30.7C  Wrist 2.8   8.7   B Elbow Wrist 59    B Elbow 7.4   8.0   A Elbow B Elbow 67    A Elbow 8.9   8.2            Clinical History: MRI CERVICAL SPINE WITHOUT CONTRAST 03/01/2017  TECHNIQUE: Multiplanar, multisequence MR imaging of the cervical spine was performed. No intravenous contrast was administered.  COMPARISON:  Cervical spine MRI 01/20/2016  FINDINGS: Alignment: Straightening of the normal lordosis.  No subluxation.  Vertebrae: Modic type 2 endplate changes at Z9-9. No acute fracture. No focal marrow lesion.  Cord: Normal signal and caliber.  Posterior Fossa, vertebral arteries, paraspinal tissues: Visualized posterior fossa is normal. Vertebral artery flow voids are preserved. Normal visualized paraspinal soft tissues.  Disc levels:  C1-C2: Normal.  C2-C3: Normal disc space and facets. No spinal canal or neuroforaminal stenosis.  C3-C4: Small disc bulge with bilateral uncovertebral hypertrophy. No spinal canal stenosis. Severe bilateral neural foraminal  stenosis, unchanged.  C4-C5: Right-sided uncovertebral hypertrophy and facet hypertrophy. No spinal canal or neural foraminal stenosis.  C5-C6: Disc osteophyte complex with unchanged severe bilateral foraminal stenosis. No central spinal canal stenosis.  C6-C7: Severe bilateral neural foraminal stenosis due to bilateral uncovertebral hypertrophy.  Small disc bulge. No central spinal canal stenosis.  C7-T1: Normal disc space and facets. No spinal canal or neuroforaminal stenosis.  IMPRESSION: 1. Severe bilateral neural foraminal stenosis at C3-4, C5-6 and C6-7, unchanged time of due to combination of uncovertebral hypertrophy and small disc bulges. 2. No spinal canal stenosis.   Lumbar Spine MRI 01/20/2016 IMPRESSION: 1. At L4-5 there is a moderate broad-based disc bulge. Mild bilateral facet arthropathy. Severe spinal stenosis. Mild bilateral foraminal stenosis. 2. At L3-4 there is a moderate broad-based disc bulge. Mild bilateral facet arthropathy. Moderate -severe spinal stenosis. 3. At L5-S1 there is a mild broad-based disc bulge. Mild bilateral facet arthropathy. Mild spinal stenosis. Moderate bilateral foraminal stenosis. 4. Overall no significant interval change.  He reports that he has never smoked. He has never used smokeless tobacco. No results for input(s): HGBA1C, LABURIC in the last 8760 hours.  Objective:  VS:  HT:    WT:   BMI:     BP:   HR: bpm  TEMP: ( )  RESP:  Physical Exam  Musculoskeletal:  Inspection reveals no atrophy of the bilateral APB or FDI or hand intrinsics. There is no swelling, color changes, allodynia or dystrophic changes. There is 5 out of 5 strength in the bilateral wrist extension, finger abduction and long finger flexion. There is somewhat subjective decreased sensation or impaired sensation to light touch in the left fourth and fifth digit. There is a negative Froment's test bilaterally. There is a negative Tinel's test at the  bilateral wrist and elbow. There is a negative Hoffmann's test bilaterally.    Ortho Exam Imaging: No results found.  Past Medical/Family/Surgical/Social History: Medications & Allergies reviewed per EMR Patient Active Problem List   Diagnosis Date Noted  . Bilateral inguinal hernia (BIH) s/p lap repair w mesh 10/18/2016 10/18/2016  . OSA on CPAP 05/07/2014  . Seizures ([place]) 11/03/2013  . BPH with obstruction/lower urinary tract symptoms 08/24/2013   Past Medical History:  Diagnosis Date  . Acute urinary retention 12/22/2011  . BPH (benign prostatic hyperplasia) 08/24/2013  . Gross hematuria 12/22/2011  . History of kidney stones   . History of urinary retention    12/2011  . Migraine   . Mild obstructive sleep apnea    PER STUDY 09-14-2009-uses CPAP  . Seizure disorder, grand mal ([place])    PER PT LAST SEIZURE 01/2013  . Seizures ([place])   . Ureteral calculus 12/22/2011   History reviewed. No pertinent family history. Past Surgical History:  Procedure Laterality Date  . CYSTO/  RIGHT RETROGRADE PYELOGRAM/  URETEROSCOPY  12-23-2011  . EXCISION LIPOMA OF SCALP AND FACE  03-24-2005  . INGUINAL HERNIA REPAIR Bilateral 10/18/2016   Procedure: LAPAROSCOPIC EXPLORATION AND REPAIR OF BILATERAL INGUINAL HERNIA WITH A TAPP REPAIR;  Surgeon: Michael Boston, MD;  Location: [facility];  Service: General;  Laterality: Bilateral;  . INSERTION OF MESH Bilateral 10/18/2016   Procedure: INSERTION OF MESH;  Surgeon: Michael Boston, MD;  Location: [facility];  Service: General;  Laterality: Bilateral;  . LAPAROSCOPIC LYSIS OF ADHESIONS  10/18/2016   Procedure: LAPAROSCOPIC LYSIS OF ADHESIONS;  Surgeon: Michael Boston, MD;  Location: [facility];  Service: General;;  . SHOULDER SURGERY Left 2009  . TRANSURETHRAL RESECTION OF PROSTATE N/A 08/24/2013   Procedure: TRANSURETHRAL RESECTION OF THE PROSTATE WITH GYRUS INSTRUMENTS;  Surgeon: Bernestine Amass, MD;  [facility];  Service: Urology;  Laterality: N/A;    Social History   Occupational History  . Taxi  driver    Social History Main Topics  . Smoking status: Never Smoker  . Smokeless tobacco: Never Used  . Alcohol use No  . Drug use: No  . Sexual activity: Not on file

## 2017-03-08 NOTE — Procedures (Signed)
EMG & NCV Findings: All nerve conduction studies (as indicated in the following tables) were within normal limits.    Needle evaluation of the left first dorsal interosseous and the left extensor digitorum communis muscles showed increased insertional activity.  All remaining muscles (as indicated in the following table) showed no evidence of electrical instability.    Impression: The above electrodiagnostic study is ABNORMAL and reveals evidence suggestive of mild chronic C7 and C8 radiculopathy on the right.    There is no significant electrodiagnostic evidence of any other focal nerve entrapment, brachial plexopathy or generalized peripheral neuropathy.   Recommendations: 1.  Follow-up with referring physician. 2.  Continue current management of symptoms.   Nerve Conduction Studies Anti Sensory Summary Table   Stim Site NR Peak (ms) Norm Peak (ms) P-T Amp (V) Norm P-T Amp Site1 Site2 Delta-P (ms) Dist (cm) Vel (m/s) Norm Vel (m/s)  Left Median Acr Palm Anti Sensory (2nd Digit)  31.5C  Wrist    3.2 <3.6 24.2 >10 Wrist Palm 1.3 0.0    Palm    1.9 <2.0 15.3         Left Radial Anti Sensory (Base 1st Digit)  30.8C  Wrist    1.7 <3.1 7.5  Wrist Base 1st Digit 1.7 0.0    Left Ulnar Anti Sensory (5th Digit)  31.6C  Wrist    3.0 <3.7 21.2 >15.0 Wrist 5th Digit 3.0 14.0 47 >38   Motor Summary Table   Stim Site NR Onset (ms) Norm Onset (ms) O-P Amp (mV) Norm O-P Amp Site1 Site2 Delta-0 (ms) Dist (cm) Vel (m/s) Norm Vel (m/s)  Left Median Motor (Abd Poll Brev)  30.6C  Wrist    3.2 <4.2 11.0 >5 Elbow Wrist 4.6 26.5 58 >50  Elbow    7.8  11.1         Left Ulnar Motor (Abd Dig Min)  30.7C  Wrist    2.8 <4.2 8.7 >3 B Elbow Wrist 4.6 27.0 59 >53  B Elbow    7.4  8.0  A Elbow B Elbow 1.5 10.0 67 >53  A Elbow    8.9  8.2          EMG   Side Muscle Nerve Root Ins Act Fibs Psw Amp Dur Poly Recrt Int Fraser Din Comment  Left 1stDorInt Ulnar C8-T1 *Incr Nml Nml Nml Nml 0 Nml Nml   Left Abd Poll  Brev Median C8-T1 Nml Nml Nml Nml Nml 0 Nml Nml   Left ExtDigCom   *Incr Nml Nml Nml Nml 0 Nml Nml   Left Triceps Radial C6-7-8 Nml Nml Nml Nml Nml 0 Nml Nml   Left Deltoid Axillary C5-6 Nml Nml Nml Nml Nml 0 Nml Nml     Nerve Conduction Studies Anti Sensory Left/Right Comparison   Stim Site L Lat (ms) R Lat (ms) L-R Lat (ms) L Amp (V) R Amp (V) L-R Amp (%) Site1 Site2 L Vel (m/s) R Vel (m/s) L-R Vel (m/s)  Median Acr Palm Anti Sensory (2nd Digit)  31.5C  Wrist 3.2   24.2   Wrist Palm     Palm 1.9   15.3         Radial Anti Sensory (Base 1st Digit)  30.8C  Wrist 1.7   7.5   Wrist Base 1st Digit     Ulnar Anti Sensory (5th Digit)  31.6C  Wrist 3.0   21.2   Wrist 5th Digit 47     Motor Left/Right Comparison  Stim Site L Lat (ms) R Lat (ms) L-R Lat (ms) L Amp (mV) R Amp (mV) L-R Amp (%) Site1 Site2 L Vel (m/s) R Vel (m/s) L-R Vel (m/s)  Median Motor (Abd Poll Brev)  30.6C  Wrist 3.2   11.0   Elbow Wrist 58    Elbow 7.8   11.1         Ulnar Motor (Abd Dig Min)  30.7C  Wrist 2.8   8.7   B Elbow Wrist 59    B Elbow 7.4   8.0   A Elbow B Elbow 67    A Elbow 8.9   8.2

## 2017-03-27 ENCOUNTER — Encounter (INDEPENDENT_AMBULATORY_CARE_PROVIDER_SITE_OTHER): Payer: Self-pay | Admitting: Specialist

## 2017-03-27 ENCOUNTER — Ambulatory Visit (INDEPENDENT_AMBULATORY_CARE_PROVIDER_SITE_OTHER): Payer: Medicaid Other | Admitting: Specialist

## 2017-03-27 VITALS — BP 178/105 | HR 61

## 2017-03-27 DIAGNOSIS — M4722 Other spondylosis with radiculopathy, cervical region: Secondary | ICD-10-CM | POA: Diagnosis not present

## 2017-03-27 DIAGNOSIS — R202 Paresthesia of skin: Secondary | ICD-10-CM

## 2017-03-27 DIAGNOSIS — M542 Cervicalgia: Secondary | ICD-10-CM

## 2017-03-27 DIAGNOSIS — R200 Anesthesia of skin: Secondary | ICD-10-CM | POA: Diagnosis not present

## 2017-03-27 DIAGNOSIS — M48062 Spinal stenosis, lumbar region with neurogenic claudication: Secondary | ICD-10-CM | POA: Diagnosis not present

## 2017-03-27 MED ORDER — TRAMADOL HCL 50 MG PO TABS: 50.0000 mg | ORAL_TABLET | Freq: Four times a day (QID) | ORAL | 0 refills | Status: DC | PRN

## 2017-03-27 NOTE — Patient Instructions (Signed)
Avoid overhead lifting and overhead use of the arms. Do not lift greater than 5 lbs. Adjust head rest in vehicle to prevent hyperextension if rear ended. Take extra precautions to avoid falling, including use of a cane if you feel weak. Scheduling secretary Kandice Hams. will call you to arrange for surgery for your cervical spine. Surgery will be a left posterior cervical foraminotomy at the C6-7 level with decompression of the cervical nerve root at C6-7 and removal of bone pressing on the nerve root..  Risks of surgery include risks of infection, bleeding and risks to the spinal cord and  Surgery is indicated due to upper extremity radiculopathy. In the future surgery at adjacent levels may be necessary but these levels do not appear to be related to your current symptoms or signs.

## 2017-03-27 NOTE — Progress Notes (Addendum)
Office Visit Note   Patient: Francis Jackson           Date of Birth: 05-07-1966           MRN: [id number] Visit Date: 03/27/2017              Requested by: Nolene Ebbs, MD [address] [place], [place] [address] PCP: Nolene Ebbs, MD   Assessment & Plan: Visit Diagnoses:  1. Cervicalgia   2. Other spondylosis with radiculopathy, cervical region   51 year old male right handed, he is a taxi driver and is experiencing ongoing severe left neck and shoulder pain with radiation into the Left arm and left hand. Clinically with weakness in left triceps and left finger extension. EMG and NCV show left C7 and C8 radiculopathy. MRI with left sided severe spondylosis and foramenal entrapment left C5-6 and C6-7. Finding relate to C7 primarily and intervention will be directed at the left C6-7 foramenal stenosis findings. No C8 nerve compression is note. I recommed a left sided C6-7 foramenotomy for left sided cervical radiculopathy and continue radicular pain and weakness. Surgery will be directed at the level with the most significant clinical findings which is C6-7 on the left side only at this level inspite of findings seen at C3-4 and C5-6. Discussed the risks of surgery including risks of bleeding, infection, and  Nerve injury or spinal cord injury. He wishes to proceed. Unfortunately his brother has passed away recently and he will need to travel to [place] for the [place]. Recommend he undergo this when he returns for this trip.    Plan: Avoid overhead lifting and overhead use of the arms. Do not lift greater than 5 lbs. Adjust Jackson rest in vehicle to prevent hyperextension if rear ended. Take extra precautions to avoid falling, including use of a cane if you feel weak. Scheduling secretary Kandice Hams. will call you to arrange for surgery for your cervical spine. Surgery will be a left posterior cervical foraminotomy at the C6-7 level with decompression of the cervical nerve root at  C6-7 and removal of bone pressing on the nerve root..  Risks of surgery include risks of infection, bleeding and risks to the spinal cord and  Surgery is indicated due to upper extremity radiculopathy. In the future surgery at adjacent levels may be necessary but these levels do not appear to be related to your current symptoms or signs  Follow-Up Instructions: Return in about 4 weeks (around 04/24/2017) for post operative eval..   Orders:  No orders of the defined types were placed in this encounter.  No orders of the defined types were placed in this encounter.     Procedures: No procedures performed   Clinical Data: No additional findings.   Subjective: Chief Complaint  Patient presents with  . Neck - Follow-up    Post EMG/NCS and MRI    51 year old male right handed with a several month history of neck pain and radiation into the left shoulder and left arm. Pain with ROM of the cervical spine and weakness in his left finger extension. EMG/NCv with left C7 and C8 radicular findings. MRI with spondylosis left C5-6 and C6-7, no finding At C7-T1. No bowel or bladder He is having difficulty with driving.     Review of Systems  Constitutional: Negative.   HENT: Negative.   Eyes: Negative.   Respiratory: Negative.   Cardiovascular: Negative.   Gastrointestinal: Negative.   Endocrine: Negative.   Genitourinary: Negative.  Musculoskeletal: Negative.   Skin: Negative.   Allergic/Immunologic: Negative.   Neurological: Negative.   Hematological: Negative.   Psychiatric/Behavioral: Negative.      Objective: Vital Signs: BP (!) 178/105 (BP Location: Left Arm, Patient Position: Sitting)   Pulse 61   Physical Exam  Constitutional: He is oriented to person, place, and time. He appears well-developed and well-nourished.  HENT:  Jackson: Normocephalic and atraumatic.  Eyes: Pupils are equal, round, and reactive to light. EOM are normal.  Neck: Normal range of motion. Neck  supple.  Pulmonary/Chest: Effort normal and breath sounds normal.  Abdominal: Soft. Bowel sounds are normal.  Neurological: He is alert and oriented to person, place, and time.  Skin: Skin is warm and dry.  Psychiatric: He has a normal mood and affect. His behavior is normal. Judgment and thought content normal.    Back Exam   Tenderness  The patient is experiencing tenderness in the cervical.  Range of Motion  Extension: abnormal  Flexion: normal  Lateral Bend Left: abnormal   Muscle Strength  Right Quadriceps:  5/5  Left Quadriceps:  5/5  Right Hamstrings:  5/5  Left Hamstrings:  5/5   Reflexes  Biceps: normal Babinski's sign: normal   Comments:  Spurling sign left neck, weakness left finger extension and left triceps 4/5      Specialty Comments:  No specialty comments available.  Imaging: No results found.   PMFS History: Patient Active Problem List   Diagnosis Date Noted  . Bilateral inguinal hernia (BIH) s/p lap repair w mesh 10/18/2016 10/18/2016  . OSA on CPAP 05/07/2014  . Seizures ([place]) 11/03/2013  . BPH with obstruction/lower urinary tract symptoms 08/24/2013   Past Medical History:  Diagnosis Date  . Acute urinary retention 12/22/2011  . BPH (benign prostatic hyperplasia) 08/24/2013  . Gross hematuria 12/22/2011  . History of kidney stones   . History of urinary retention    12/2011  . Migraine   . Mild obstructive sleep apnea    PER STUDY 09-14-2009-uses CPAP  . Seizure disorder, grand mal ([place])    PER PT LAST SEIZURE 01/2013  . Seizures ([place])   . Ureteral calculus 12/22/2011    No family history on file.  Past Surgical History:  Procedure Laterality Date  . CYSTO/  RIGHT RETROGRADE PYELOGRAM/  URETEROSCOPY  12-23-2011  . EXCISION LIPOMA OF SCALP AND FACE  03-24-2005  . INGUINAL HERNIA REPAIR Bilateral 10/18/2016   Procedure: LAPAROSCOPIC EXPLORATION AND REPAIR OF BILATERAL INGUINAL HERNIA WITH A TAPP REPAIR;  Surgeon: Michael Boston, MD;   Location: [facility];  Service: General;  Laterality: Bilateral;  . INSERTION OF MESH Bilateral 10/18/2016   Procedure: INSERTION OF MESH;  Surgeon: Michael Boston, MD;  Location: [facility];  Service: General;  Laterality: Bilateral;  . LAPAROSCOPIC LYSIS OF ADHESIONS  10/18/2016   Procedure: LAPAROSCOPIC LYSIS OF ADHESIONS;  Surgeon: Michael Boston, MD;  Location: [facility];  Service: General;;  . SHOULDER SURGERY Left 2009  . TRANSURETHRAL RESECTION OF PROSTATE N/A 08/24/2013   Procedure: TRANSURETHRAL RESECTION OF THE PROSTATE WITH GYRUS INSTRUMENTS;  Surgeon: Bernestine Amass, MD;  Location: [facility];  Service: Urology;  Laterality: N/A;   Social History   Occupational History  . Taxi driver    Social History Main Topics  . Smoking status: Never Smoker  . Smokeless tobacco: Never Used  . Alcohol use No  . Drug use: No  . Sexual activity: Not on file

## 2017-06-06 ENCOUNTER — Ambulatory Visit (INDEPENDENT_AMBULATORY_CARE_PROVIDER_SITE_OTHER): Payer: Medicaid Other | Admitting: Specialist

## 2017-06-07 ENCOUNTER — Telehealth (INDEPENDENT_AMBULATORY_CARE_PROVIDER_SITE_OTHER): Payer: Self-pay

## 2017-06-07 NOTE — Telephone Encounter (Signed)
Patient called to cancel surgery for 06/24/17.  His wife is overseas for a funeral so he has no one to help him/drive him.  He will call back to reschedule when she returns.

## 2017-06-20 ENCOUNTER — Other Ambulatory Visit (INDEPENDENT_AMBULATORY_CARE_PROVIDER_SITE_OTHER): Payer: Self-pay | Admitting: Specialist

## 2017-06-20 DIAGNOSIS — M4722 Other spondylosis with radiculopathy, cervical region: Secondary | ICD-10-CM

## 2017-06-20 DIAGNOSIS — R202 Paresthesia of skin: Secondary | ICD-10-CM

## 2017-06-20 DIAGNOSIS — M48062 Spinal stenosis, lumbar region with neurogenic claudication: Secondary | ICD-10-CM

## 2017-06-20 DIAGNOSIS — R200 Anesthesia of skin: Secondary | ICD-10-CM

## 2017-06-20 NOTE — Telephone Encounter (Signed)
Tramadol refill request 

## 2017-06-24 NOTE — Telephone Encounter (Signed)
[facility] [address]

## 2017-08-15 ENCOUNTER — Other Ambulatory Visit (INDEPENDENT_AMBULATORY_CARE_PROVIDER_SITE_OTHER): Payer: Self-pay | Admitting: Specialist

## 2017-08-15 DIAGNOSIS — M4722 Other spondylosis with radiculopathy, cervical region: Secondary | ICD-10-CM

## 2017-08-15 DIAGNOSIS — R200 Anesthesia of skin: Secondary | ICD-10-CM

## 2017-08-15 DIAGNOSIS — M48062 Spinal stenosis, lumbar region with neurogenic claudication: Secondary | ICD-10-CM

## 2017-08-15 DIAGNOSIS — R202 Paresthesia of skin: Secondary | ICD-10-CM

## 2017-08-15 NOTE — Telephone Encounter (Signed)
Tramadol refill request 

## 2017-08-20 NOTE — Telephone Encounter (Signed)
[facility] 

## 2017-09-25 ENCOUNTER — Other Ambulatory Visit (INDEPENDENT_AMBULATORY_CARE_PROVIDER_SITE_OTHER): Payer: Self-pay | Admitting: Specialist

## 2017-09-25 DIAGNOSIS — M48062 Spinal stenosis, lumbar region with neurogenic claudication: Secondary | ICD-10-CM

## 2017-09-25 DIAGNOSIS — R202 Paresthesia of skin: Secondary | ICD-10-CM

## 2017-09-25 DIAGNOSIS — M4722 Other spondylosis with radiculopathy, cervical region: Secondary | ICD-10-CM

## 2017-09-25 DIAGNOSIS — R200 Anesthesia of skin: Secondary | ICD-10-CM

## 2017-09-30 NOTE — Telephone Encounter (Signed)
Tramadol refill request 

## 2017-10-04 NOTE — Telephone Encounter (Signed)
[facility] 

## 2017-12-16 ENCOUNTER — Encounter: Payer: Self-pay | Admitting: Nurse Practitioner

## 2017-12-18 NOTE — Progress Notes (Signed)
[facility]  PATIENT: Francis Jackson DOB: [birth year]   REASON FOR VISIT: Follow-up for obstructive sleep apnea CPAP compliance, seizure disorder HISTORY FROM: Patient    HISTORY OF PRESENT ILLNESS: Francis Jackson is all 52 years old right-handed [place] American male, native of [place], return to clinic for followup seizure, last clinical visit was May 2013 with [name]  He began to have seizure since August 05 2009, it was nocturnal seizure, generalized tonic-clonic lasting about 10-15 minutes.  Evaluation showed normal MRI of the brain, EEG, laboratory showed normal CBC, CMP, UA, negative UDS.  Second nocturnal seizure Francis 2011, third seizure in February 2012, he was visiting his friend with his wife, he began to circling around, confused, and then went to tonic-clonic , he was started on Keppra 500 mg twice a day in 2012,  4 seizure was in June 2013, while he was not compliant with his Keppra, he was taken to emergency room, had multiple abrasions to his face, and right eye, CAT scan of the brain and neck showed no significant abnormality.  Fifths seizure in May 2014, again happened when he was not compliant with his Keppra. Proceeding by rising sensation, nauseous,  He also complains of symptoms consistent with obstructive sleep apnea, had sleep study in January 2011, at [facility], by Dr. Kimberlee Nearing, there was evidence of mild obstructive sleep apnea, lowest SpO2 was 82%, he was given advise of losing weight, sleep on his side, he continued to have frequent snoring, wife has to shake him to stop snoring, start breathing almost every night  UPDATE December 19 2016:[name] clinical visit was on December 20 2015, as the seizure was in July 10 2014 after he misses his Keppra dose, is now taking Keppra 500 mg twice a day.  He has been compliant with his CPAP machine, which help him sleep better, UPDATE April 11th,  2019[name] 52 year old male returns for  follow-up, with history of seizure disorder.  Last seizure occurred in 2015.  He is currently on Keppra XR 500 mg 2 tablets at bedtime.  He denies missing any doses.  CPAP compliance dated 11/17/2017- 12/16/2017 shows compliance greater than 4 hours for 60% for 18 days.  Average usage 4 hours 52 minutes.  Set pressure 7 cm.  EPR level 1.  AHI 7.7 ESS 5.  Patient reports he does not feel he is getting enough pressure from his CPAP.  FSS 16.  He returns for reevaluation  REVIEW OF SYSTEMS: Full 14 system review of systems performed and notable only for those listed, all others are neg:  Constitutional: neg  Cardiovascular: neg Ear/Nose/Throat: neg  Skin: neg Eyes: neg Respiratory: neg Gastroitestinal: neg  Hematology/Lymphatic: neg  Endocrine: neg Musculoskeletal: History of back pain Allergy/Immunology: neg Neurological: History of seizure disorder, last seizure 2015 Psychiatric: neg Sleep : Obstructive sleep apnea with CPAP   ALLERGIES: No Known Allergies  HOME MEDICATIONS: Outpatient Medications Prior to Visit  Medication Sig Dispense Refill  . levETIRAcetam (KEPPRA XR) 500 MG 24 hr tablet Take 2 tablets (1,000 mg total) by mouth at bedtime. Prefer to fill his Levetiracetam xr if his insurance pays, if his insurance does not pay,  Go ahead with regular preparation Levetiracetam 500mg  bid. 60 tablet 11  . metoprolol succinate (TOPROL-XL) 50 MG 24 hr tablet Take 50 mg by mouth daily.  5  . naproxen (NAPROSYN) 500 MG tablet Take 1 tablet (500 mg total) by mouth 2 (two) times daily as needed (pain). [place]  tablet 1  . traMADol (ULTRAM) 50 MG tablet TAKE 1-2 TABLETS EVERY 6HRS AS NEEDED 60 tablet 0  . levETIRAcetam (KEPPRA) 500 MG tablet TAKE 1 TABLET (500 MG TOTAL) BY MOUTH 2 (TWO) TIMES DAILY. 180 tablet 4  . methocarbamol (ROBAXIN) 500 MG tablet 1 (ONE) TABLET BY MOUTH FOUR TIMES DAILY, AS NEEDED  1  . methylPREDNISolone (MEDROL DOSEPAK) 4 MG TBPK tablet Take as directed (Patient not taking:  Reported on 12/19/2017) 21 tablet 0   No facility-administered medications prior to visit.     PAST MEDICAL HISTORY: Past Medical History:  Diagnosis Date  . Acute urinary retention 12/22/2011  . BPH (benign prostatic hyperplasia) 08/24/2013  . Gross hematuria 12/22/2011  . History of kidney stones   . History of urinary retention    12/2011  . Migraine   . Mild obstructive sleep apnea    PER STUDY 09-14-2009-uses CPAP  . Seizure disorder, grand mal ([place])    PER PT LAST SEIZURE 01/2013  . Seizures ([place])   . Ureteral calculus 12/22/2011    PAST SURGICAL HISTORY: Past Surgical History:  Procedure Laterality Date  . CYSTO/  RIGHT RETROGRADE PYELOGRAM/  URETEROSCOPY  12-23-2011  . EXCISION LIPOMA OF SCALP AND FACE  03-24-2005  . INGUINAL HERNIA REPAIR Bilateral 10/18/2016   Procedure: LAPAROSCOPIC EXPLORATION AND REPAIR OF BILATERAL INGUINAL HERNIA WITH A TAPP REPAIR;  Surgeon: Michael Boston, MD;  [facility];  Service: General;  Laterality: Bilateral;  . INSERTION OF MESH Bilateral 10/18/2016   Procedure: INSERTION OF MESH;  Surgeon: Michael Boston, MD;  [facility];  Service: General;  Laterality: Bilateral;  . LAPAROSCOPIC LYSIS OF ADHESIONS  10/18/2016   Procedure: LAPAROSCOPIC LYSIS OF ADHESIONS;  Surgeon: Michael Boston, MD;  [facility];  Service: General;;  . SHOULDER SURGERY Left 2009  . TRANSURETHRAL RESECTION OF PROSTATE N/A 08/24/2013   Procedure: TRANSURETHRAL RESECTION OF THE PROSTATE WITH GYRUS INSTRUMENTS;  Surgeon: Bernestine Amass, MD;  Location: [facility];  Service: Urology;  Laterality: N/A;    FAMILY HISTORY: History reviewed. No pertinent family history.  SOCIAL HISTORY: Social History   Socioeconomic History  . Marital status: Married    Spouse name: Not on file  . Number of children: 4  . Years of education: 71  . Highest education level: Not on file  Occupational History  . Occupation: Architect  Social Needs  . Financial  resource strain: Not on file  . Food insecurity:    Worry: Not on file    Inability: Not on file  . Transportation needs:    Medical: Not on file    Non-medical: Not on file  Tobacco Use  . Smoking status: Never Smoker  . Smokeless tobacco: Never Used  Substance and Sexual Activity  . Alcohol use: No    Alcohol/week: 0.0 oz  . Drug use: No  . Sexual activity: Not on file  Lifestyle  . Physical activity:    Days per week: Not on file    Minutes per session: Not on file  . Stress: Not on file  Relationships  . Social connections:    Talks on phone: Not on file    Gets together: Not on file    Attends religious service: Not on file    Active member of club or organization: Not on file    Attends meetings of clubs or organizations: Not on file    Relationship status: Not on file  . Intimate partner violence:  Fear of current or ex partner: Not on file    Emotionally abused: Not on file    Physically abused: Not on file    Forced sexual activity: Not on file  Other Topics Concern  . Not on file  Social History Narrative   Patient lives at home with his wife Sueanne Margarita)   Patient works as a Architect part time.   Right handed   Patient drinks one cup of tea per week.   Patient has four children.      Originally from [place] in [place]:   12/19/17 0933  BP: 137/84  Pulse: 64  Weight: 186 lb 3.2 oz (84.5 kg)  Height: 5\' 8"  (1.727 m)   Body mass index is 28.31 kg/m.  Generalized: Well developed, in no acute distress  Head: normocephalic and atraumatic,. Oropharynx benign  Neck: Supple,   Musculoskeletal: No deformity   Neurological examination   Mentation: Alert oriented to time, place, history taking. Attention span and concentration appropriate. Recent and remote memory intact.  Follows all commands speech and language fluent.   Cranial nerve II-XII: Pupils were equal round reactive to light extraocular movements were  full, visual field were full on confrontational test. Facial sensation and strength were normal. hearing was intact to finger rubbing bilaterally. Uvula tongue midline. head turning and shoulder shrug were normal and symmetric.Tongue protrusion into cheek strength was normal. Motor: normal bulk and tone, full strength in the BUE, BLE, Sensory: normal and symmetric to light touch,  Coordination: finger-nose-finger, heel-to-shin bilaterally, no dysmetria Reflexes: Symmetric upper and lower plantar responses were flexor bilaterally. Gait and Station: Rising up from seated position without assistance, normal stance,  moderate stride, good arm swing, smooth turning, able to perform tiptoe, and heel walking without difficulty. Tandem gait is steady  DIAGNOSTIC DATA (LABS, IMAGING, TESTING) - I reviewed patient records, labs, notes, testing and imaging myself where available.  Lab Results  Component Value Date   WBC 3.5 (L) 10/09/2016   HGB 14.5 10/09/2016   HCT 44.7 10/09/2016   MCV 78.0 10/09/2016   PLT 304 10/09/2016      Component Value Date/Time   NA 140 10/09/2016 0947   K 4.4 10/09/2016 0947   CL 106 10/09/2016 0947   CO2 31 10/09/2016 0947   GLUCOSE 91 10/09/2016 0947   BUN 10 10/09/2016 0947   CREATININE 0.96 10/09/2016 0947   CALCIUM 8.7 (L) 10/09/2016 0947   PROT 7.8 02/12/2012 1726   ALBUMIN 4.3 02/12/2012 1726   AST 27 02/12/2012 1726   ALT 24 02/12/2012 1726   ALKPHOS 63 02/12/2012 1726   BILITOT 0.3 02/12/2012 1726   GFRNONAA >60 10/09/2016 0947   GFRAA >60 10/09/2016 0947    ASSESSMENT AND PLAN  52 y.o. year old male  has a past medical history seizure disorder with last seizure occurring July 10, 2014.  He also has a history of obstructive sleep apnea with CPAP   PLAN: Continue Keppra as ordered for seizure disorder will refill CPAP compliance 60% greater than 4 hours reviewed data with patient AHI 7.7 increase pressure to 8 cm Follow-up with Megan in 3  months for repeat compliance Dennie Bible, [facility], [facility], [facility] [address], [place], [place] [address] [phone]

## 2017-12-19 ENCOUNTER — Ambulatory Visit: Payer: Medicaid Other | Admitting: Nurse Practitioner

## 2017-12-19 ENCOUNTER — Other Ambulatory Visit (INDEPENDENT_AMBULATORY_CARE_PROVIDER_SITE_OTHER): Payer: Self-pay | Admitting: Specialist

## 2017-12-19 ENCOUNTER — Encounter: Payer: Self-pay | Admitting: Nurse Practitioner

## 2017-12-19 VITALS — BP 137/84 | HR 64 | Ht 68.0 in | Wt 186.2 lb

## 2017-12-19 DIAGNOSIS — R202 Paresthesia of skin: Secondary | ICD-10-CM

## 2017-12-19 DIAGNOSIS — R200 Anesthesia of skin: Secondary | ICD-10-CM

## 2017-12-19 DIAGNOSIS — Z9989 Dependence on other enabling machines and devices: Secondary | ICD-10-CM

## 2017-12-19 DIAGNOSIS — R569 Unspecified convulsions: Secondary | ICD-10-CM | POA: Diagnosis not present

## 2017-12-19 DIAGNOSIS — G4733 Obstructive sleep apnea (adult) (pediatric): Secondary | ICD-10-CM | POA: Diagnosis not present

## 2017-12-19 DIAGNOSIS — M48062 Spinal stenosis, lumbar region with neurogenic claudication: Secondary | ICD-10-CM

## 2017-12-19 DIAGNOSIS — M4722 Other spondylosis with radiculopathy, cervical region: Secondary | ICD-10-CM

## 2017-12-19 MED ORDER — LEVETIRACETAM ER 500 MG PO TB24
1000.0000 mg | ORAL_TABLET | Freq: Every day | ORAL | 11 refills | Status: DC
Start: 2017-12-19 — End: 2019-01-01

## 2017-12-19 NOTE — Progress Notes (Addendum)
[facility] confirmation received [facility] [phone]. sy  Also messaged Patsy Baltimore with [facility] for cpap pressure change.  Fax confirmation received as well AH Maryan Char, RN; Patsy Baltimore; Malva Cogan,  I have pulled this pressure change order and sent it into the office. Thanks.    C [phone].

## 2017-12-19 NOTE — Telephone Encounter (Signed)
[facility] [address]

## 2017-12-19 NOTE — Patient Instructions (Signed)
Continue Keppra as ordered for seizure disorder will refill CPAP compliance 60% greater than 4 hours AHI 7.7 increase pressure to 8 cm Follow-up with Megan in 3 months for repeat compliance

## 2017-12-19 NOTE — Progress Notes (Signed)
I have reviewed and agreed above plan. 

## 2017-12-19 NOTE — Addendum Note (Signed)
Addended by: Minda Ditto, Geoffery Spruce on: 12/19/2017 02:23 PM   Modules accepted: Orders

## 2017-12-20 MED ORDER — TRAMADOL HCL 50 MG PO TABS: ORAL_TABLET | ORAL | 0 refills | Status: DC

## 2017-12-23 NOTE — Telephone Encounter (Signed)
[facility] 

## 2018-01-17 ENCOUNTER — Telehealth (INDEPENDENT_AMBULATORY_CARE_PROVIDER_SITE_OTHER): Payer: Self-pay | Admitting: Specialist

## 2018-01-17 NOTE — Telephone Encounter (Signed)
Patient came into the clinic requesting to get another injection with Dr. Gaylyn Rong advise, is this ok?

## 2018-01-17 NOTE — Telephone Encounter (Signed)
Patient came into the clinic requesting to get another injection with Dr. Ernestina Patches.  CB#[phone].  Thank you

## 2018-01-27 ENCOUNTER — Other Ambulatory Visit (INDEPENDENT_AMBULATORY_CARE_PROVIDER_SITE_OTHER): Payer: Self-pay | Admitting: Specialist

## 2018-01-27 DIAGNOSIS — M4722 Other spondylosis with radiculopathy, cervical region: Secondary | ICD-10-CM

## 2018-01-27 DIAGNOSIS — R200 Anesthesia of skin: Secondary | ICD-10-CM

## 2018-01-27 DIAGNOSIS — R202 Paresthesia of skin: Secondary | ICD-10-CM

## 2018-01-27 DIAGNOSIS — M48062 Spinal stenosis, lumbar region with neurogenic claudication: Secondary | ICD-10-CM

## 2018-01-28 NOTE — Telephone Encounter (Signed)
Tramadol refill request 

## 2018-01-29 NOTE — Telephone Encounter (Signed)
[facility] 

## 2018-02-18 ENCOUNTER — Telehealth (INDEPENDENT_AMBULATORY_CARE_PROVIDER_SITE_OTHER): Payer: Self-pay | Admitting: Radiology

## 2018-02-18 NOTE — Telephone Encounter (Signed)
Okay to repeat.

## 2018-02-18 NOTE — Telephone Encounter (Signed)
Patient came into office to check the status of a message that was taken on 01/17/18 for him to get another injection by Dr.Newton, message was sent to Dr Louanne Skye & he has not replied. I spoke with Alyse Low regarding and she was going to see if Dr Ernestina Patches would reply.

## 2018-02-18 NOTE — Telephone Encounter (Signed)
Please see message below

## 2018-02-18 NOTE — Telephone Encounter (Signed)
Okay to repeat if he has had no new trauma etc.

## 2018-02-18 NOTE — Telephone Encounter (Signed)
Patient came into the clinic requesting to get another injection with Dr. Ernestina Patches.  CB#[phone].  Thank you

## 2018-02-18 NOTE — Telephone Encounter (Signed)
Last injection was bilateral L4 TF on 12/31/16. Please advise.

## 2018-02-18 NOTE — Telephone Encounter (Signed)
Patient came into the clinic requesting to get another injection with Dr. Ernestina Patches.  CB#[phone].  Thank you

## 2018-02-19 NOTE — Telephone Encounter (Signed)
Scheduled for 03/20/18 at 1330. Patient confirmed that he will have a driver.

## 2018-03-20 ENCOUNTER — Ambulatory Visit (INDEPENDENT_AMBULATORY_CARE_PROVIDER_SITE_OTHER): Payer: Medicaid Other | Admitting: Physical Medicine and Rehabilitation

## 2018-03-20 ENCOUNTER — Ambulatory Visit (INDEPENDENT_AMBULATORY_CARE_PROVIDER_SITE_OTHER): Payer: Self-pay

## 2018-03-20 ENCOUNTER — Encounter (INDEPENDENT_AMBULATORY_CARE_PROVIDER_SITE_OTHER): Payer: Self-pay | Admitting: Physical Medicine and Rehabilitation

## 2018-03-20 VITALS — BP 158/94 | HR 52

## 2018-03-20 DIAGNOSIS — M5416 Radiculopathy, lumbar region: Secondary | ICD-10-CM | POA: Diagnosis not present

## 2018-03-20 DIAGNOSIS — M48062 Spinal stenosis, lumbar region with neurogenic claudication: Secondary | ICD-10-CM | POA: Diagnosis not present

## 2018-03-20 MED ORDER — BETAMETHASONE SOD PHOS & ACET 6 (3-3) MG/ML IJ SUSP
12.0000 mg | Freq: Once | INTRAMUSCULAR | Status: AC
Start: 2018-03-20 — End: 2018-03-20
  Administered 2018-03-20: 12 mg

## 2018-03-20 NOTE — Progress Notes (Signed)
 .  Numeric Pain Rating Scale and Functional Assessment Average Pain 8   In the last MONTH (on 0-10 scale) has pain interfered with the following?  1. General activity like being  able to carry out your everyday physical activities such as walking, climbing stairs, carrying groceries, or moving a chair?  Rating(4)   +Driver, -BT, -Dye Allergies.  

## 2018-03-20 NOTE — Patient Instructions (Signed)

## 2018-03-24 ENCOUNTER — Other Ambulatory Visit (INDEPENDENT_AMBULATORY_CARE_PROVIDER_SITE_OTHER): Payer: Self-pay | Admitting: Specialist

## 2018-03-24 DIAGNOSIS — M4722 Other spondylosis with radiculopathy, cervical region: Secondary | ICD-10-CM

## 2018-03-24 DIAGNOSIS — R202 Paresthesia of skin: Secondary | ICD-10-CM

## 2018-03-24 DIAGNOSIS — R200 Anesthesia of skin: Secondary | ICD-10-CM

## 2018-03-24 DIAGNOSIS — M48062 Spinal stenosis, lumbar region with neurogenic claudication: Secondary | ICD-10-CM

## 2018-03-24 NOTE — Telephone Encounter (Signed)
Tramadol refill request 

## 2018-03-25 NOTE — Telephone Encounter (Signed)
[facility] [address]

## 2018-04-01 NOTE — Progress Notes (Signed)
Francis Jackson - 52 y.o. male MRN [id number]  Date of birth: 07-29-1966  Office Visit Note: Visit Date: 03/20/2018 PCP: Francis Ebbs, MD Referred by: Francis Ebbs, MD  Subjective: Chief Complaint  Patient presents with  . Lower Back - Pain  . Left Leg - Pain   HPI: Francis Jackson is a 52 year old gentleman that I last saw in April of last year in 2018 and completed bilateral L4 transforaminal epidural steroid injection for what was bilateral low back and radicular type pain into the hips and thighs.  He had MRI findings of lumbar stenosis particularly multifactorial severe stenosis at L3-4 and lateral recess findings at L4-5.  He is typically followed by Dr. Basil Jackson in our office.  He called in recently with increasing pain and Dr. Louanne Jackson suggested injection possibility.  We are seeing him here today for evaluation management.  He reports that he has pain in the lower back that radiates predominantly in the left leg but also sometimes in the right.  He reports that his same pain is been having for a few years.  No specific injury no new injury or falls or trauma.  He reports this pain is worse with sitting for a long period of time and that standing instructions seems to make it somewhat better.  He does have some difficulty ambulating for long distances.  He rates his pain is an 8 out of 10.  He is used a combination of medications over the last few years without much relief.  He states injection performed last year did help him fairly well.  He reports onset of symptoms over the last several months and just progressive worsening again without trauma.  It does seem to limit his daily activities at this point.  He has not found anything that really helps except for the injection last year.  He has had therapy in the past and continues to try to stay active.  He has not had any focal weakness or bowel or bladder difficulty or fevers chills or night sweats or unexplained weight loss.  No red flag  complaints.   Review of Systems  Constitutional: Negative for chills, fever, malaise/fatigue and weight loss.  HENT: Negative for hearing loss and sinus pain.   Eyes: Negative for blurred vision, double vision and photophobia.  Respiratory: Negative for cough and shortness of breath.   Cardiovascular: Negative for chest pain, palpitations and leg swelling.  Gastrointestinal: Negative for abdominal pain, nausea and vomiting.  Genitourinary: Negative for flank pain.  Musculoskeletal: Positive for back pain and joint pain. Negative for myalgias.       Left leg pain  Skin: Negative for itching and rash.  Neurological: Negative for tremors, focal weakness and weakness.  Endo/Heme/Allergies: Negative.   Psychiatric/Behavioral: Negative for depression.  All other systems reviewed and are negative.  Otherwise per HPI.  Assessment & Plan: Visit Diagnoses:  1. Lumbar radiculopathy   2. Spinal stenosis of lumbar region with neurogenic claudication     Plan: Findings:  Chronic pain syndrome and chronic worsening low back pain and bilateral hip and leg pain now left more than right over the last several months progressive worsening an 8 out of 10 pain.  Only relief he is gotten his with prior injection performed last year and prior to that injection did help.  MRI findings show pretty severe multifactorial stenosis at L3-4.  L4 injection did seem to help.  He has had other conservative care without much relief.  I  think the best approach is to repeat the bilateral L4 transforaminal injection.  If he does not get much relief would look at an L3 transforaminal injection.  If he is not doing well at that point will need to have him follow-up with Dr. Louanne Jackson for possible surgical consideration.  He is also had neck issues and cervical spine issues in the past as well.  Appropriate imaging is reviewed below and reviewed with the patient.    Meds & Orders:  Meds ordered this encounter  Medications  .  betamethasone acetate-betamethasone sodium phosphate (CELESTONE) injection 12 mg    Orders Placed This Encounter  Procedures  . XR C-ARM NO REPORT  . Epidural Steroid injection    Follow-up: Return in about 1 month (around 04/17/2018) for Dr. Basil Jackson.   Procedures: No procedures performed  Lumbosacral Transforaminal Epidural Steroid Injection - Sub-Pedicular Approach with Fluoroscopic Guidance  Patient: Francis Jackson      Date of Birth: June 22, 1966 MRN: [id number] PCP: Francis Ebbs, MD      Visit Date: 03/20/2018   Universal Protocol:    Date/Time: 03/20/2018  Consent Given By: the patient  Position: PRONE  Additional Comments: Vital signs were monitored before and after the procedure. Patient was prepped and draped in the usual sterile fashion. The correct patient, procedure, and site was verified.   Injection Procedure Details:  Procedure Site One Meds Administered:  Meds ordered this encounter  Medications  . betamethasone acetate-betamethasone sodium phosphate (CELESTONE) injection 12 mg    Laterality: Bilateral  Location/Site:  L4-L5  Needle size: 22 G  Needle type: Spinal  Needle Placement: Transforaminal  Findings:    -Comments: Excellent flow of contrast along the nerve and into the epidural space.  Procedure Details: After squaring off the end-plates to get a true AP view, the C-arm was positioned so that an oblique view of the foramen as noted above was visualized. The target area is just inferior to the "nose of the scotty dog" or sub pedicular. The soft tissues overlying this structure were infiltrated with 2-3 ml. of 1% Lidocaine without Epinephrine.  The spinal needle was inserted toward the target using a "trajectory" view along the fluoroscope beam.  Under AP and lateral visualization, the needle was advanced so it did not puncture dura and was located close the 6 O'Clock position of the pedical in AP tracterory. Biplanar projections were  used to confirm position. Aspiration was confirmed to be negative for CSF and/or blood. A 1-2 ml. volume of Isovue-250 was injected and flow of contrast was noted at each level. Radiographs were obtained for documentation purposes.   After attaining the desired flow of contrast documented above, a 0.5 to 1.0 ml test dose of 0.25% Marcaine was injected into each respective transforaminal space.  The patient was observed for 90 seconds post injection.  After no sensory deficits were reported, and normal lower extremity motor function was noted,   the above injectate was administered so that equal amounts of the injectate were placed at each foramen (level) into the transforaminal epidural space.   Additional Comments:  The patient tolerated the procedure well Dressing: Band-Aid    Post-procedure details: Patient was observed during the procedure. Post-procedure instructions were reviewed.  Patient left the clinic in stable condition.    Clinical History: MRI CERVICAL SPINE WITHOUT CONTRAST 03/01/2017  TECHNIQUE: Multiplanar, multisequence MR imaging of the cervical spine was performed. No intravenous contrast was administered.  COMPARISON:  Cervical spine MRI 01/20/2016  FINDINGS:  Alignment: Straightening of the normal lordosis.  No subluxation.  Vertebrae: Modic type 2 endplate changes at O8-4. No acute fracture. No focal marrow lesion.  Cord: Normal signal and caliber.  Posterior Fossa, vertebral arteries, paraspinal tissues: Visualized posterior fossa is normal. Vertebral artery flow voids are preserved. Normal visualized paraspinal soft tissues.  Disc levels:  C1-C2: Normal.  C2-C3: Normal disc space and facets. No spinal canal or neuroforaminal stenosis.  C3-C4: Small disc bulge with bilateral uncovertebral hypertrophy. No spinal canal stenosis. Severe bilateral neural foraminal stenosis, unchanged.  C4-C5: Right-sided uncovertebral hypertrophy and facet  hypertrophy. No spinal canal or neural foraminal stenosis.  C5-C6: Disc osteophyte complex with unchanged severe bilateral foraminal stenosis. No central spinal canal stenosis.  C6-C7: Severe bilateral neural foraminal stenosis due to bilateral uncovertebral hypertrophy. Small disc bulge. No central spinal canal stenosis.  C7-T1: Normal disc space and facets. No spinal canal or neuroforaminal stenosis.  IMPRESSION: 1. Severe bilateral neural foraminal stenosis at C3-4, C5-6 and C6-7, unchanged time of due to combination of uncovertebral hypertrophy and small disc bulges. 2. No spinal canal stenosis.   Lumbar Spine MRI 01/20/2016 IMPRESSION: 1. At L4-5 there is a moderate broad-based disc bulge. Mild bilateral facet arthropathy. Severe spinal stenosis. Mild bilateral foraminal stenosis. 2. At L3-4 there is a moderate broad-based disc bulge. Mild bilateral facet arthropathy. Moderate -severe spinal stenosis. 3. At L5-S1 there is a mild broad-based disc bulge. Mild bilateral facet arthropathy. Mild spinal stenosis. Moderate bilateral foraminal stenosis. 4. Overall no significant interval change.   He reports that he has never smoked. He has never used smokeless tobacco. No results for input(s): HGBA1C, LABURIC in the last 8760 hours.  Objective:  VS:  HT:    WT:   BMI:     BP:(!) 158/94  HR:(!) 52bpm  TEMP: ( )  RESP:  Physical Exam  Constitutional: He is oriented to person, place, and time. He appears well-developed and well-nourished. No distress.  HENT:  Head: Normocephalic and atraumatic.  Nose: Nose normal.  Mouth/Throat: Oropharynx is clear and moist.  Eyes: Pupils are equal, round, and reactive to light. Conjunctivae are normal.  Neck: Normal range of motion. Neck supple. No tracheal deviation present.  Cardiovascular: Regular rhythm and intact distal pulses.  Pulmonary/Chest: Effort normal and breath sounds normal.  Abdominal: Soft. He exhibits no  distension. There is no rebound and no guarding.  Musculoskeletal: He exhibits no deformity.  Patient stands with a forward flexed lumbar spine.  He has pain with extension and facet joint loading of the lumbar spine.  No pain over the greater trochanters and no pain with hip rotation.  He has good distal strength without clonus.  He has negative slump test.  Neurological: He is alert and oriented to person, place, and time. He exhibits normal muscle tone. Coordination normal.  Skin: Skin is warm. No rash noted.  Psychiatric: He has a normal mood and affect. His behavior is normal.  Nursing note and vitals reviewed.   Ortho Exam Imaging: No results found.  Past Medical/Family/Surgical/Social History: Medications & Allergies reviewed per EMR, new medications updated. Patient Active Problem List   Diagnosis Date Noted  . Bilateral inguinal hernia (BIH) s/p lap repair w mesh 10/18/2016 10/18/2016  . OSA on CPAP 05/07/2014  . Seizures ([place]) 11/03/2013  . BPH with obstruction/lower urinary tract symptoms 08/24/2013   Past Medical History:  Diagnosis Date  . Acute urinary retention 12/22/2011  . BPH (benign prostatic hyperplasia) 08/24/2013  . Gross hematuria 12/22/2011  .  History of kidney stones   . History of urinary retention    12/2011  . Migraine   . Mild obstructive sleep apnea    PER STUDY 09-14-2009-uses CPAP  . Seizure disorder, grand mal ([place])    PER PT LAST SEIZURE 01/2013  . Seizures ([place])   . Ureteral calculus 12/22/2011   History reviewed. No pertinent family history. Past Surgical History:  Procedure Laterality Date  . CYSTO/  RIGHT RETROGRADE PYELOGRAM/  URETEROSCOPY  12-23-2011  . EXCISION LIPOMA OF SCALP AND FACE  03-24-2005  . INGUINAL HERNIA REPAIR Bilateral 10/18/2016   Procedure: LAPAROSCOPIC EXPLORATION AND REPAIR OF BILATERAL INGUINAL HERNIA WITH A TAPP REPAIR;  Surgeon: Michael Boston, MD;  Location: [facility];  Service: General;  Laterality: Bilateral;  . INSERTION  OF MESH Bilateral 10/18/2016   Procedure: INSERTION OF MESH;  Surgeon: Michael Boston, MD;  Location: [facility];  Service: General;  Laterality: Bilateral;  . LAPAROSCOPIC LYSIS OF ADHESIONS  10/18/2016   Procedure: LAPAROSCOPIC LYSIS OF ADHESIONS;  Surgeon: Michael Boston, MD;  Location: [facility];  Service: General;;  . SHOULDER SURGERY Left 2009  . TRANSURETHRAL RESECTION OF PROSTATE N/A 08/24/2013   Procedure: TRANSURETHRAL RESECTION OF THE PROSTATE WITH GYRUS INSTRUMENTS;  Surgeon: Bernestine Amass, MD;  Location: [facility];  Service: Urology;  Laterality: N/A;   Social History   Occupational History  . Occupation: Architect  Tobacco Use  . Smoking status: Never Smoker  . Smokeless tobacco: Never Used  Substance and Sexual Activity  . Alcohol use: No    Alcohol/week: 0.0 oz  . Drug use: No  . Sexual activity: Not on file

## 2018-04-01 NOTE — Procedures (Signed)
Lumbosacral Transforaminal Epidural Steroid Injection - Sub-Pedicular Approach with Fluoroscopic Guidance  Patient: Francis Jackson      Date of Birth: 1966-04-09 MRN: [id number] PCP: Nolene Ebbs, MD      Visit Date: 03/20/2018   Universal Protocol:    Date/Time: 03/20/2018  Consent Given By: the patient  Position: PRONE  Additional Comments: Vital signs were monitored before and after the procedure. Patient was prepped and draped in the usual sterile fashion. The correct patient, procedure, and site was verified.   Injection Procedure Details:  Procedure Site One Meds Administered:  Meds ordered this encounter  Medications  . betamethasone acetate-betamethasone sodium phosphate (CELESTONE) injection 12 mg    Laterality: Bilateral  Location/Site:  L4-L5  Needle size: 22 G  Needle type: Spinal  Needle Placement: Transforaminal  Findings:    -Comments: Excellent flow of contrast along the nerve and into the epidural space.  Procedure Details: After squaring off the end-plates to get a true AP view, the C-arm was positioned so that an oblique view of the foramen as noted above was visualized. The target area is just inferior to the "nose of the scotty dog" or sub pedicular. The soft tissues overlying this structure were infiltrated with 2-3 ml. of 1% Lidocaine without Epinephrine.  The spinal needle was inserted toward the target using a "trajectory" view along the fluoroscope beam.  Under AP and lateral visualization, the needle was advanced so it did not puncture dura and was located close the 6 O'Clock position of the pedical in AP tracterory. Biplanar projections were used to confirm position. Aspiration was confirmed to be negative for CSF and/or blood. A 1-2 ml. volume of Isovue-250 was injected and flow of contrast was noted at each level. Radiographs were obtained for documentation purposes.   After attaining the desired flow of contrast documented above, a 0.5 to  1.0 ml test dose of 0.25% Marcaine was injected into each respective transforaminal space.  The patient was observed for 90 seconds post injection.  After no sensory deficits were reported, and normal lower extremity motor function was noted,   the above injectate was administered so that equal amounts of the injectate were placed at each foramen (level) into the transforaminal epidural space.   Additional Comments:  The patient tolerated the procedure well Dressing: Band-Aid    Post-procedure details: Patient was observed during the procedure. Post-procedure instructions were reviewed.  Patient left the clinic in stable condition.

## 2018-04-14 ENCOUNTER — Encounter: Payer: Self-pay | Admitting: Adult Health

## 2018-04-14 ENCOUNTER — Ambulatory Visit: Payer: Medicaid Other | Admitting: Adult Health

## 2018-04-14 VITALS — BP 164/95 | HR 48 | Ht 68.0 in | Wt 185.2 lb

## 2018-04-14 DIAGNOSIS — R569 Unspecified convulsions: Secondary | ICD-10-CM | POA: Diagnosis not present

## 2018-04-14 DIAGNOSIS — G4733 Obstructive sleep apnea (adult) (pediatric): Secondary | ICD-10-CM | POA: Diagnosis not present

## 2018-04-14 DIAGNOSIS — Z9989 Dependence on other enabling machines and devices: Secondary | ICD-10-CM

## 2018-04-14 NOTE — Progress Notes (Signed)
I have reviewed and agreed above plan. 

## 2018-04-14 NOTE — Patient Instructions (Signed)
Your Plan:  Continue using CPAP nightly  Continue Keppra for seizures If your symptoms worsen or you develop new symptoms please let us know.    Thank you for coming to see [place] at [facility]. I hope we have been able to provide you high quality care today.  You may receive a patient satisfaction survey over the next few weeks. We would appreciate your feedback and comments so that we may continue to improve ourselves and the health of our patients.

## 2018-04-14 NOTE — Progress Notes (Signed)
PATIENT: Francis Jackson DOB: 1966/07/08  REASON FOR VISIT: follow up HISTORY FROM: patient  HISTORY OF PRESENT ILLNESS: Today 04/14/18  HISTORY Francis Jackson is all 52 years old right-handed [place] American male, native of [place], return to clinic for followup seizure, last clinical visit was May 2013 with [name]  He began to have seizure since August 05 2009, it was nocturnal seizure, generalized tonic-clonic lasting about 10-15 minutes.  Evaluation showed normal MRI of the brain, EEG, laboratory showed normal CBC, CMP, UA, negative UDS.  Second nocturnal seizure July 2011, third seizure in February 2012, he was visiting his friend with his wife, he began to circling around, confused, and then went to tonic-clonic , he was started on Keppra 500 mg twice a day in 2012,  4 seizure was in June 2013, while he was not compliant with his Keppra, he was taken to emergency room, had multiple abrasions to his face, and right eye, CAT scan of the brain and neck showed no significant abnormality.  Fifths seizure in May 2014, again happened when he was not compliant with his Keppra. Proceeding by rising sensation, nauseous,  He also complains of symptoms consistent with obstructive sleep apnea, had sleep study in January 2011, at [facility], by Dr. Kimberlee Nearing, there was evidence of mild obstructive sleep apnea, lowest SpO2 was 82%, he was given advise of losing weight, sleep on his side, he continued to have frequent snoring, wife has to shake him to stop snoring, start breathing almost every night  UPDATE December 19 2016: [name] clinical visit was on December 20 2015,as the seizure was in July 10 2014 after he misses his Keppra dose, is now taking Keppra 500 mg twice a day.  He has been compliant with his CPAP machine, which help him sleep better  UPDATE April 11th,  2019[name] 52 year old male returns for follow-up, with history of seizure disorder.  Last seizure occurred in 2015.  He  is currently on Keppra XR 500 mg 2 tablets at bedtime.  He denies missing any doses.  CPAP compliance dated 11/17/2017- 12/16/2017 shows compliance greater than 4 hours for 60% for 18 days.  Average usage 4 hours 52 minutes.  Set pressure 7 cm.  EPR level 1.  AHI 7.7 ESS 5.  Patient reports he does not feel he is getting enough pressure from his CPAP.  FSS 16.  He returns for reevaluation  Today 04/14/18 Francis Jackson is a 52 year old male with a history of obstructive sleep apnea on CPAP as well as seizures.  His CPAP download indicates that he uses machine 25 out of 90 days for compliance of 83%.  He uses machine greater than 4 hours 51 out of 90 days for compliance of 57%.  On average he uses his machine 5 hours.  His residual AHI is 3.7 on 8 cm of water with EPR of 1.  The patient states that sometimes he does not go to bed till after 1 AM.  Patient repots that he does not use the machine during the day when he naps.  He remains on Keppra.  Denies any seizure events.  He returns today for evaluation.   REVIEW OF SYSTEMS: Out of a complete 14 system review of symptoms, the patient complains only of the following symptoms, and all other reviewed systems are negative.  See HPI, ESS 10  ALLERGIES: No Known Allergies  HOME MEDICATIONS: Outpatient Medications Prior to Visit  Medication Sig Dispense Refill  . levETIRAcetam (  KEPPRA XR) 500 MG 24 hr tablet Take 2 tablets (1,000 mg total) by mouth at bedtime. Prefer to fill his Levetiracetam xr if his insurance pays, if his insurance does not pay,  Go ahead with regular preparation Levetiracetam 500mg  bid. 60 tablet 11  . metoprolol succinate (TOPROL-XL) 50 MG 24 hr tablet Take 50 mg by mouth daily.  5  . naproxen (NAPROSYN) 500 MG tablet Take 1 tablet (500 mg total) by mouth 2 (two) times daily as needed (pain). 40 tablet 1  . traMADol (ULTRAM) 50 MG tablet TAKE 1 TO 2 TABLET BY MOUTH EVERY 6 HOURS AS NEEDED 40 tablet 0   No facility-administered  medications prior to visit.     PAST MEDICAL HISTORY: Past Medical History:  Diagnosis Date  . Acute urinary retention 12/22/2011  . BPH (benign prostatic hyperplasia) 08/24/2013  . Gross hematuria 12/22/2011  . History of kidney stones   . History of urinary retention    12/2011  . Migraine   . Mild obstructive sleep apnea    PER STUDY 09-14-2009-uses CPAP  . Seizure disorder, grand mal ([place])    PER PT LAST SEIZURE 01/2013  . Seizures ([place])   . Ureteral calculus 12/22/2011    PAST SURGICAL HISTORY: Past Surgical History:  Procedure Laterality Date  . CYSTO/  RIGHT RETROGRADE PYELOGRAM/  URETEROSCOPY  12-23-2011  . EXCISION LIPOMA OF SCALP AND FACE  03-24-2005  . INGUINAL HERNIA REPAIR Bilateral 10/18/2016   Procedure: LAPAROSCOPIC EXPLORATION AND REPAIR OF BILATERAL INGUINAL HERNIA WITH A TAPP REPAIR;  Surgeon: Michael Boston, MD;  Location: [facility];  Service: General;  Laterality: Bilateral;  . INSERTION OF MESH Bilateral 10/18/2016   Procedure: INSERTION OF MESH;  Surgeon: Michael Boston, MD;  Location: [facility];  Service: General;  Laterality: Bilateral;  . LAPAROSCOPIC LYSIS OF ADHESIONS  10/18/2016   Procedure: LAPAROSCOPIC LYSIS OF ADHESIONS;  Surgeon: Michael Boston, MD;  Location: [facility];  Service: General;;  . SHOULDER SURGERY Left 2009  . TRANSURETHRAL RESECTION OF PROSTATE N/A 08/24/2013   Procedure: TRANSURETHRAL RESECTION OF THE PROSTATE WITH GYRUS INSTRUMENTS;  Surgeon: Bernestine Amass, MD;  Location: [facility];  Service: Urology;  Laterality: N/A;    FAMILY HISTORY: No family history on file.  SOCIAL HISTORY: Social History   Socioeconomic History  . Marital status: Married    Spouse name: Not on file  . Number of children: 4  . Years of education: 81  . Highest education level: Not on file  Occupational History  . Occupation: Architect  Social Needs  . Financial resource strain: Not on file  . Food insecurity:    Worry: Not on file    Inability:  Not on file  . Transportation needs:    Medical: Not on file    Non-medical: Not on file  Tobacco Use  . Smoking status: Never Smoker  . Smokeless tobacco: Never Used  Substance and Sexual Activity  . Alcohol use: No    Alcohol/week: 0.0 oz  . Drug use: No  . Sexual activity: Not on file  Lifestyle  . Physical activity:    Days per week: Not on file    Minutes per session: Not on file  . Stress: Not on file  Relationships  . Social connections:    Talks on phone: Not on file    Gets together: Not on file    Attends religious service: Not on file    Active member of club or organization:  Not on file    Attends meetings of clubs or organizations: Not on file    Relationship status: Not on file  . Intimate partner violence:    Fear of current or ex partner: Not on file    Emotionally abused: Not on file    Physically abused: Not on file    Forced sexual activity: Not on file  Other Topics Concern  . Not on file  Social History Narrative   Patient lives at home with his wife Sueanne Margarita)   Patient works as a Architect part time.   Right handed   Patient drinks one cup of tea per week.   Patient has four children.      Originally from [place] in [place]:   04/14/18 0759  BP: (!) 164/95  Pulse: (!) 48  Weight: 185 lb 3.2 oz (84 kg)  Height: 5\' 8"  (1.727 m)   Body mass index is 28.16 kg/m.  Generalized: Well developed, in no acute distress   Neurological examination  Mentation: Alert oriented to time, place, history taking. Follows all commands speech and language fluent Cranial nerve II-XII: Pupils were equal round reactive to light. Extraocular movements were full, visual field were full on confrontational test. Facial sensation and strength were normal. Uvula tongue midline. Head turning and shoulder shrug  were normal and symmetric.  Neck circumference 17 inches, Mallampati 4+ Motor: The motor testing reveals 5 over 5 strength  of all 4 extremities. Good symmetric motor tone is noted throughout.  Sensory: Sensory testing is intact to soft touch on all 4 extremities. No evidence of extinction is noted.  Coordination: Cerebellar testing reveals good finger-nose-finger and heel-to-shin bilaterally.  Gait and station: Gait is normal. Reflexes: Deep tendon reflexes are symmetric and normal bilaterally.   DIAGNOSTIC DATA (LABS, IMAGING, TESTING) - I reviewed patient records, labs, notes, testing and imaging myself where available.  Lab Results  Component Value Date   WBC 3.5 (L) 10/09/2016   HGB 14.5 10/09/2016   HCT 44.7 10/09/2016   MCV 78.0 10/09/2016   PLT 304 10/09/2016      Component Value Date/Time   NA 140 10/09/2016 0947   K 4.4 10/09/2016 0947   CL 106 10/09/2016 0947   CO2 31 10/09/2016 0947   GLUCOSE 91 10/09/2016 0947   BUN 10 10/09/2016 0947   CREATININE 0.96 10/09/2016 0947   CALCIUM 8.7 (L) 10/09/2016 0947   PROT 7.8 02/12/2012 1726   ALBUMIN 4.3 02/12/2012 1726   AST 27 02/12/2012 1726   ALT 24 02/12/2012 1726   ALKPHOS 63 02/12/2012 1726   BILITOT 0.3 02/12/2012 1726   GFRNONAA >60 10/09/2016 0947   GFRAA >60 10/09/2016 0947     ASSESSMENT AND PLAN 52 y.o. year old male  has a past medical history of Acute urinary retention (12/22/2011), BPH (benign prostatic hyperplasia) (08/24/2013), Gross hematuria (12/22/2011), History of kidney stones, History of urinary retention, Migraine, Mild obstructive sleep apnea, Seizure disorder, grand mal ([place]), Seizures ([place]), and Ureteral calculus (12/22/2011). here with:  1.  Obstructive sleep apnea on CPAP 2.  Seizures  The patient CPAP download shows good treatment of his apnea but suboptimal compliance.  He is encouraged to use machine greater than 4 hours each night.  He is also encouraged to use machine when he naps.  He will remain on Keppra.  If he has any seizure events he should let us know.  He will follow-up  in 1 year or sooner if  needed.    Ward Givens, MSN, NP-C 04/14/2018, 8:24 AM [facility] [address] [place], [place] [address] [phone]

## 2018-04-23 ENCOUNTER — Other Ambulatory Visit (INDEPENDENT_AMBULATORY_CARE_PROVIDER_SITE_OTHER): Payer: Self-pay | Admitting: Specialist

## 2018-04-23 DIAGNOSIS — R202 Paresthesia of skin: Secondary | ICD-10-CM

## 2018-04-23 DIAGNOSIS — M4722 Other spondylosis with radiculopathy, cervical region: Secondary | ICD-10-CM

## 2018-04-23 DIAGNOSIS — M48062 Spinal stenosis, lumbar region with neurogenic claudication: Secondary | ICD-10-CM

## 2018-04-23 DIAGNOSIS — R200 Anesthesia of skin: Secondary | ICD-10-CM

## 2018-04-23 NOTE — Telephone Encounter (Signed)
[facility] 

## 2018-04-23 NOTE — Telephone Encounter (Signed)
Tramadol refill request 

## 2018-05-08 ENCOUNTER — Ambulatory Visit (INDEPENDENT_AMBULATORY_CARE_PROVIDER_SITE_OTHER): Payer: Medicaid Other | Admitting: Specialist

## 2018-05-08 ENCOUNTER — Encounter (INDEPENDENT_AMBULATORY_CARE_PROVIDER_SITE_OTHER): Payer: Self-pay | Admitting: Specialist

## 2018-05-08 DIAGNOSIS — R202 Paresthesia of skin: Secondary | ICD-10-CM

## 2018-05-08 DIAGNOSIS — M4722 Other spondylosis with radiculopathy, cervical region: Secondary | ICD-10-CM | POA: Diagnosis not present

## 2018-05-08 DIAGNOSIS — R200 Anesthesia of skin: Secondary | ICD-10-CM

## 2018-05-08 DIAGNOSIS — M48062 Spinal stenosis, lumbar region with neurogenic claudication: Secondary | ICD-10-CM

## 2018-05-08 MED ORDER — GABAPENTIN 100 MG PO CAPS: ORAL_CAPSULE | ORAL | 0 refills | Status: DC

## 2018-05-08 MED ORDER — TRAMADOL HCL 50 MG PO TABS: ORAL_TABLET | ORAL | 2 refills | Status: DC

## 2018-05-08 NOTE — Progress Notes (Signed)
Office Visit Note   Patient: Francis Jackson           Date of Birth: May 18, 1966           MRN: [id number] Visit Date: 05/08/2018              Requested by: Nolene Ebbs, MD [address] [place], [place] [address] PCP: Nolene Ebbs, MD   Assessment & Plan: Visit Diagnoses: No diagnosis found.  Plan:Avoid overhead lifting and overhead use of the arms. Do not lift greater than 5 lbs. Adjust head rest in vehicle to prevent hyperextension if rear ended. Take extra precautions to avoid falling. Start gabapentin ramp up one tablet  Each week until you are on three times a day. Tramadol for more severe pain.  Follow-Up Instructions: Return in about 4 weeks (around 06/05/2018).   Orders:  No orders of the defined types were placed in this encounter.  No orders of the defined types were placed in this encounter.     Procedures: No procedures performed   Clinical Data: No additional findings.   Subjective: Chief Complaint  Patient presents with  . Lower Back - Follow-up    52 year old male with one year history of right arm and forearm and ulnar digit numbness and paresthesias. He underwent MRI which showed spondylosis changes.He has some narrowing at the C3-4 level with degenerative disc disease changes. There is right foramenal narrowing described as severe right and left C6-7 and right and left.C4-5. He is standing and walking with no falls or balance difficulties. No bowel or bladder difficulty. Previously with left arm pain and numbness, now it has changed side to the right side.   Review of Systems  Constitutional: Negative.   HENT: Negative.   Eyes: Negative.   Respiratory: Negative.   Cardiovascular: Negative.   Gastrointestinal: Negative.   Endocrine: Negative.   Genitourinary: Negative.   Musculoskeletal: Negative.   Skin: Negative.   Allergic/Immunologic: Negative.   Neurological: Negative.   Hematological: Negative.   Psychiatric/Behavioral:  Negative.      Objective: Vital Signs: BP (!) 151/87 (BP Location: Left Arm, Patient Position: Sitting)   Pulse (!) 58   Ht 5\' 8"  (1.727 m)   Wt 185 lb (83.9 kg)   BMI 28.13 kg/m   Physical Exam  Ortho Exam  Specialty Comments:  No specialty comments available.  Imaging: No results found.   PMFS History: Patient Active Problem List   Diagnosis Date Noted  . Bilateral inguinal hernia (BIH) s/p lap repair w mesh 10/18/2016 10/18/2016  . OSA on CPAP 05/07/2014  . Seizures ([place]) 11/03/2013  . BPH with obstruction/lower urinary tract symptoms 08/24/2013   Past Medical History:  Diagnosis Date  . Acute urinary retention 12/22/2011  . BPH (benign prostatic hyperplasia) 08/24/2013  . Gross hematuria 12/22/2011  . History of kidney stones   . History of urinary retention    12/2011  . Migraine   . Mild obstructive sleep apnea    PER STUDY 09-14-2009-uses CPAP  . Seizure disorder, grand mal ([place])    PER PT LAST SEIZURE 01/2013  . Seizures ([place])   . Ureteral calculus 12/22/2011    History reviewed. No pertinent family history.  Past Surgical History:  Procedure Laterality Date  . CYSTO/  RIGHT RETROGRADE PYELOGRAM/  URETEROSCOPY  12-23-2011  . EXCISION LIPOMA OF SCALP AND FACE  03-24-2005  . INGUINAL HERNIA REPAIR Bilateral 10/18/2016   Procedure: LAPAROSCOPIC EXPLORATION AND REPAIR OF BILATERAL INGUINAL HERNIA WITH  A TAPP REPAIR;  Surgeon: Michael Boston, MD;  Location: [facility];  Service: General;  Laterality: Bilateral;  . INSERTION OF MESH Bilateral 10/18/2016   Procedure: INSERTION OF MESH;  Surgeon: Michael Boston, MD;  Location: [facility];  Service: General;  Laterality: Bilateral;  . LAPAROSCOPIC LYSIS OF ADHESIONS  10/18/2016   Procedure: LAPAROSCOPIC LYSIS OF ADHESIONS;  Surgeon: Michael Boston, MD;  Location: [facility];  Service: General;;  . SHOULDER SURGERY Left 2009  . TRANSURETHRAL RESECTION OF PROSTATE N/A 08/24/2013   Procedure: TRANSURETHRAL RESECTION OF THE PROSTATE WITH  GYRUS INSTRUMENTS;  Surgeon: Bernestine Amass, MD;  Location: [facility];  Service: Urology;  Laterality: N/A;   Social History   Occupational History  . Occupation: Architect  Tobacco Use  . Smoking status: Never Smoker  . Smokeless tobacco: Never Used  Substance and Sexual Activity  . Alcohol use: No    Alcohol/week: 0.0 standard drinks  . Drug use: No  . Sexual activity: Not on file

## 2018-05-08 NOTE — Patient Instructions (Signed)
Avoid overhead lifting and overhead use of the arms. Do not lift greater than 5 lbs. Adjust head rest in vehicle to prevent hyperextension if rear ended. Take extra precautions to avoid falling. Start gabapentin ramp up one tablet  Each week until you are on three times a day. Tramadol for more severe pain.

## 2018-05-14 NOTE — Progress Notes (Signed)
I agree with the assessment and plan as directed by NP .The patient is known to me .   Camey Edell, MD  

## 2018-05-26 ENCOUNTER — Encounter (INDEPENDENT_AMBULATORY_CARE_PROVIDER_SITE_OTHER): Payer: Self-pay | Admitting: Physical Medicine and Rehabilitation

## 2018-05-26 ENCOUNTER — Telehealth (INDEPENDENT_AMBULATORY_CARE_PROVIDER_SITE_OTHER): Payer: Self-pay | Admitting: *Deleted

## 2018-05-26 NOTE — Telephone Encounter (Signed)
Called pt and left vm #1 to get appt r/s.

## 2018-06-16 ENCOUNTER — Ambulatory Visit (INDEPENDENT_AMBULATORY_CARE_PROVIDER_SITE_OTHER): Payer: Self-pay

## 2018-06-16 ENCOUNTER — Ambulatory Visit (INDEPENDENT_AMBULATORY_CARE_PROVIDER_SITE_OTHER): Payer: Medicaid Other | Admitting: Physical Medicine and Rehabilitation

## 2018-06-16 ENCOUNTER — Encounter (INDEPENDENT_AMBULATORY_CARE_PROVIDER_SITE_OTHER): Payer: Self-pay | Admitting: Physical Medicine and Rehabilitation

## 2018-06-16 VITALS — BP 154/93 | HR 54 | Temp 98.4°F

## 2018-06-16 DIAGNOSIS — M5412 Radiculopathy, cervical region: Secondary | ICD-10-CM

## 2018-06-16 MED ORDER — METHYLPREDNISOLONE ACETATE 80 MG/ML IJ SUSP
80.0000 mg | Freq: Once | INTRAMUSCULAR | Status: AC
  Administered 2018-06-16: 80 mg

## 2018-06-16 NOTE — Progress Notes (Signed)
  Numeric Pain Rating Scale and Functional Assessment Average Pain 7   In the last MONTH (on 0-10 scale) has pain interfered with the following?  1. General activity like being  able to carry out your everyday physical activities such as walking, climbing stairs, carrying groceries, or moving a chair?  Rating(0)   +Driver, +BT, -Dye Allergies.  

## 2018-06-16 NOTE — Patient Instructions (Signed)

## 2018-06-18 ENCOUNTER — Ambulatory Visit (INDEPENDENT_AMBULATORY_CARE_PROVIDER_SITE_OTHER): Payer: Medicaid Other | Admitting: Specialist

## 2018-06-30 NOTE — Progress Notes (Signed)
[name] - 52 y.o. male MRN [id number]  Date of birth: 14-Dec-1965  Office Visit Note: Visit Date: 06/16/2018 PCP: Nolene Ebbs, MD Referred by: Nolene Ebbs, MD  Subjective: Chief Complaint  Patient presents with  . Neck - Pain   HPI:  Francis Jackson is a 52 y.o. male who comes in today For planned right C7-T1 interlaminar epidural steroid injection requested by Dr. Basil Dess.  Patient had chronic neck and particularly right shoulder and arm pain.  He has by foraminal narrowing at C5-6 and C6-7 on cervical MRI.  He has had prior injection with decent relief.  He is followed by Dr. Basil Dess his notes can be reviewed.  ROS Otherwise per HPI.  Assessment & Plan: Visit Diagnoses:  1. Cervical radiculopathy     Plan: No additional findings.   Meds & Orders:  Meds ordered this encounter  Medications  . methylPREDNISolone acetate (DEPO-MEDROL) injection 80 mg    Orders Placed This Encounter  Procedures  . XR C-ARM NO REPORT  . Epidural Steroid injection    Follow-up: Return if symptoms worsen or fail to improve.   Procedures: No procedures performed  Cervical Epidural Steroid Injection - Interlaminar Approach with Fluoroscopic Guidance  Patient: Francis Jackson      Date of Birth: May 31, 1966 MRN: [id number] PCP: Nolene Ebbs, MD      Visit Date: 06/16/2018   Universal Protocol:    Date/Time: 10/21/196:10 AM  Consent Given By: the patient  Position: PRONE  Additional Comments: Vital signs were monitored before and after the procedure. Patient was prepped and draped in the usual sterile fashion. The correct patient, procedure, and site was verified.   Injection Procedure Details:  Procedure Site One Meds Administered:  Meds ordered this encounter  Medications  . methylPREDNISolone acetate (DEPO-MEDROL) injection 80 mg     Laterality: Right  Location/Site: C7-T1  Needle size: 20 G  Needle type: Touhy  Needle Placement: Paramedian epidural  space  Findings:  -Comments: Excellent flow of contrast into the epidural space.  Procedure Details: Using a paramedian approach from the side mentioned above, the region overlying the inferior lamina was localized under fluoroscopic visualization and the soft tissues overlying this structure were infiltrated with 4 ml. of 1% Lidocaine without Epinephrine. A # 20 gauge, Tuohy needle was inserted into the epidural space using a paramedian approach.  The epidural space was localized using loss of resistance along with lateral and contralateral oblique bi-planar fluoroscopic views.  After negative aspirate for air, blood, and CSF, a 2 ml. volume of Isovue-250 was injected into the epidural space and the flow of contrast was observed. Radiographs were obtained for documentation purposes.   The injectate was administered into the level noted above.  Additional Comments:  The patient tolerated the procedure well Dressing: Band-Aid    Post-procedure details: Patient was observed during the procedure. Post-procedure instructions were reviewed.  Patient left the clinic in stable condition.    Clinical History: MRI CERVICAL SPINE WITHOUT CONTRAST 03/01/2017  TECHNIQUE: Multiplanar, multisequence MR imaging of the cervical spine was performed. No intravenous contrast was administered.  COMPARISON:  Cervical spine MRI 01/20/2016  FINDINGS: Alignment: Straightening of the normal lordosis.  No subluxation.  Vertebrae: Modic type 2 endplate changes at J0-9. No acute fracture. No focal marrow lesion.  Cord: Normal signal and caliber.  Posterior Fossa, vertebral arteries, paraspinal tissues: Visualized posterior fossa is normal. Vertebral artery flow voids are preserved. Normal visualized paraspinal soft tissues.  Disc  levels:  C1-C2: Normal.  C2-C3: Normal disc space and facets. No spinal canal or neuroforaminal stenosis.  C3-C4: Small disc bulge with bilateral  uncovertebral hypertrophy. No spinal canal stenosis. Severe bilateral neural foraminal stenosis, unchanged.  C4-C5: Right-sided uncovertebral hypertrophy and facet hypertrophy. No spinal canal or neural foraminal stenosis.  C5-C6: Disc osteophyte complex with unchanged severe bilateral foraminal stenosis. No central spinal canal stenosis.  C6-C7: Severe bilateral neural foraminal stenosis due to bilateral uncovertebral hypertrophy. Small disc bulge. No central spinal canal stenosis.  C7-T1: Normal disc space and facets. No spinal canal or neuroforaminal stenosis.  IMPRESSION: 1. Severe bilateral neural foraminal stenosis at C3-4, C5-6 and C6-7, unchanged time of due to combination of uncovertebral hypertrophy and small disc bulges. 2. No spinal canal stenosis.   Lumbar Spine MRI 01/20/2016 IMPRESSION: 1. At L4-5 there is a moderate broad-based disc bulge. Mild bilateral facet arthropathy. Severe spinal stenosis. Mild bilateral foraminal stenosis. 2. At L3-4 there is a moderate broad-based disc bulge. Mild bilateral facet arthropathy. Moderate -severe spinal stenosis. 3. At L5-S1 there is a mild broad-based disc bulge. Mild bilateral facet arthropathy. Mild spinal stenosis. Moderate bilateral foraminal stenosis. 4. Overall no significant interval change.     Objective:  VS:  HT:    WT:   BMI:     BP:(!) 154/93  HR:(!) 54bpm  TEMP:98.4 F (36.9 C)(Oral)  RESP:96 % Physical Exam  Ortho Exam Imaging: No results found.

## 2018-06-30 NOTE — Procedures (Signed)
Cervical Epidural Steroid Injection - Interlaminar Approach with Fluoroscopic Guidance  Patient: Francis Jackson      Date of Birth: 1965-12-17 MRN: [id number] PCP: Nolene Ebbs, MD      Visit Date: 06/16/2018   Universal Protocol:    Date/Time: 10/21/196:10 AM  Consent Given By: the patient  Position: PRONE  Additional Comments: Vital signs were monitored before and after the procedure. Patient was prepped and draped in the usual sterile fashion. The correct patient, procedure, and site was verified.   Injection Procedure Details:  Procedure Site One Meds Administered:  Meds ordered this encounter  Medications  . methylPREDNISolone acetate (DEPO-MEDROL) injection 80 mg     Laterality: Right  Location/Site: C7-T1  Needle size: 20 G  Needle type: Touhy  Needle Placement: Paramedian epidural space  Findings:  -Comments: Excellent flow of contrast into the epidural space.  Procedure Details: Using a paramedian approach from the side mentioned above, the region overlying the inferior lamina was localized under fluoroscopic visualization and the soft tissues overlying this structure were infiltrated with 4 ml. of 1% Lidocaine without Epinephrine. A # 20 gauge, Tuohy needle was inserted into the epidural space using a paramedian approach.  The epidural space was localized using loss of resistance along with lateral and contralateral oblique bi-planar fluoroscopic views.  After negative aspirate for air, blood, and CSF, a 2 ml. volume of Isovue-250 was injected into the epidural space and the flow of contrast was observed. Radiographs were obtained for documentation purposes.   The injectate was administered into the level noted above.  Additional Comments:  The patient tolerated the procedure well Dressing: Band-Aid    Post-procedure details: Patient was observed during the procedure. Post-procedure instructions were reviewed.  Patient left the clinic in stable  condition.

## 2018-12-04 ENCOUNTER — Other Ambulatory Visit (INDEPENDENT_AMBULATORY_CARE_PROVIDER_SITE_OTHER): Payer: Self-pay | Admitting: Specialist

## 2018-12-04 DIAGNOSIS — R200 Anesthesia of skin: Secondary | ICD-10-CM

## 2018-12-04 DIAGNOSIS — M48062 Spinal stenosis, lumbar region with neurogenic claudication: Secondary | ICD-10-CM

## 2018-12-04 DIAGNOSIS — R202 Paresthesia of skin: Secondary | ICD-10-CM

## 2018-12-04 DIAGNOSIS — M4722 Other spondylosis with radiculopathy, cervical region: Secondary | ICD-10-CM

## 2018-12-04 NOTE — Telephone Encounter (Signed)
Sent request for tramadol to Dr. Nitka 

## 2018-12-04 NOTE — Telephone Encounter (Signed)
Rx refill Tramadol [facility] [place]

## 2018-12-08 MED ORDER — TRAMADOL HCL 50 MG PO TABS: ORAL_TABLET | ORAL | 2 refills | Status: DC

## 2018-12-09 ENCOUNTER — Telehealth (INDEPENDENT_AMBULATORY_CARE_PROVIDER_SITE_OTHER): Payer: Self-pay | Admitting: Specialist

## 2018-12-09 NOTE — Telephone Encounter (Signed)
I called patient and advised that I called his rx into [facility] yesterday. He states that they are needing something from our office but I could not figure out what they need and I advised him to have them to fax [place].

## 2018-12-09 NOTE — Telephone Encounter (Signed)
Patient called following up on the refill on his Tramadol.  Message was sent on March 26.  CB#[phone].  Thank you.

## 2019-01-01 ENCOUNTER — Telehealth: Payer: Self-pay | Admitting: Neurology

## 2019-01-01 MED ORDER — LEVETIRACETAM ER 500 MG PO TB24: 1000.0000 mg | ORAL_TABLET | Freq: Every day | ORAL | 5 refills | Status: DC

## 2019-01-01 NOTE — Telephone Encounter (Signed)
I returned call to the patient. He needs refills of his generic Keppra XR 500mg , two tablets at bedtime.  I confirmed his pharmacy on file and sent the refills to [facility] for him.  He has a pending appt on 04/20/2019.

## 2019-01-01 NOTE — Telephone Encounter (Signed)
Patient came to the lobby to state that he is out of his seizure medication and needs a refill. Best call back [phone]

## 2019-01-10 ENCOUNTER — Emergency Department (HOSPITAL_COMMUNITY): Payer: No Typology Code available for payment source

## 2019-01-10 ENCOUNTER — Other Ambulatory Visit: Payer: Self-pay

## 2019-01-10 ENCOUNTER — Encounter (HOSPITAL_COMMUNITY): Payer: Self-pay | Admitting: Emergency Medicine

## 2019-01-10 ENCOUNTER — Emergency Department (HOSPITAL_COMMUNITY)
Admission: EM | Admit: 2019-01-10 | Discharge: 2019-01-10 | Disposition: A | Discharge status: Home / Self Care | Payer: No Typology Code available for payment source | Attending: Emergency Medicine | Admitting: Emergency Medicine

## 2019-01-10 DIAGNOSIS — Y929 Unspecified place or not applicable: Secondary | ICD-10-CM | POA: Diagnosis not present

## 2019-01-10 DIAGNOSIS — S4991XA Unspecified injury of right shoulder and upper arm, initial encounter: Secondary | ICD-10-CM | POA: Insufficient documentation

## 2019-01-10 DIAGNOSIS — R0789 Other chest pain: Secondary | ICD-10-CM | POA: Diagnosis not present

## 2019-01-10 DIAGNOSIS — Y939 Activity, unspecified: Secondary | ICD-10-CM | POA: Insufficient documentation

## 2019-01-10 DIAGNOSIS — Y999 Unspecified external cause status: Secondary | ICD-10-CM | POA: Insufficient documentation

## 2019-01-10 DIAGNOSIS — Z79899 Other long term (current) drug therapy: Secondary | ICD-10-CM | POA: Insufficient documentation

## 2019-01-10 DIAGNOSIS — R079 Chest pain, unspecified: Secondary | ICD-10-CM | POA: Diagnosis present

## 2019-01-10 DIAGNOSIS — R51 Headache: Secondary | ICD-10-CM | POA: Diagnosis not present

## 2019-01-10 MED ORDER — ACETAMINOPHEN 325 MG PO TABS
650.0000 mg | ORAL_TABLET | Freq: Once | ORAL | Status: AC
Start: 2019-01-10 — End: 2019-01-10
  Administered 2019-01-10: 650 mg via ORAL
  Filled 2019-01-10: qty 2

## 2019-01-10 MED ORDER — CYCLOBENZAPRINE HCL 10 MG PO TABS: 10.0000 mg | ORAL_TABLET | Freq: Three times a day (TID) | ORAL | 0 refills | Status: DC | PRN

## 2019-01-10 MED ORDER — IBUPROFEN 200 MG PO TABS
600.0000 mg | ORAL_TABLET | Freq: Once | ORAL | Status: AC
  Administered 2019-01-10: 600 mg via ORAL
  Filled 2019-01-10: qty 3

## 2019-01-10 NOTE — ED Notes (Signed)
Pt ambulatory to room.
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
## 2019-02-11 ENCOUNTER — Telehealth: Payer: Self-pay | Admitting: Neurology

## 2019-02-11 DIAGNOSIS — R569 Unspecified convulsions: Secondary | ICD-10-CM

## 2019-02-11 DIAGNOSIS — Z9989 Dependence on other enabling machines and devices: Secondary | ICD-10-CM

## 2019-02-11 DIAGNOSIS — G4733 Obstructive sleep apnea (adult) (pediatric): Secondary | ICD-10-CM

## 2019-02-11 NOTE — Telephone Encounter (Signed)
Pt called stating that the supply company called him and told him to set up an evaluation with his provider but could not completely understand the person that called him. Pt would like RN to call him to explain to him what is going on. Please advise.

## 2019-02-11 NOTE — Telephone Encounter (Signed)
Called the patient back and he states that he should be due for a new machine. The current maching is making a noise and after talking with [facility] he states they are needing permission for a new machine. Informed the patient I will touch base with [facility] and see if I can send new order or if he has to be seen first. Pt verbalized understanding As of right now patient has apt 04/20/19 and we will keep as is.

## 2019-02-12 ENCOUNTER — Telehealth: Payer: Self-pay | Admitting: *Deleted

## 2019-02-12 NOTE — Telephone Encounter (Addendum)
Received note from [employer],  that for new cpap machine, it is requiring a current 6 month eval , pt last seen 04-2018.  Made doxy.me visit with pt. (consent given). TEXT [phone] @ [email or web address].  Due to current COVID 19 pandemic, our office is severely reducing in office visits until further notice, in order to minimize the risk to our patients and healthcare providers.  Pt understands that although there may be some limitations with this type of visit, we will take all precautions to reduce any security or privacy concerns.  Pt understands that this will be treated like an in office visit and we will file with pt's insurance, and there may be a patient responsible charge related to this service.  Appt made 02-16-19 with amy lomax, np.  Order is in system, ofv note document that pt using cpap nightly, is benefiting from therapy.  Will need the note to be faxed to [employer] [facility]. [phone].

## 2019-02-12 NOTE — Telephone Encounter (Signed)
Link has been sent to the patient's phone.

## 2019-02-16 ENCOUNTER — Encounter: Payer: Self-pay | Admitting: Family Medicine

## 2019-02-16 ENCOUNTER — Ambulatory Visit (INDEPENDENT_AMBULATORY_CARE_PROVIDER_SITE_OTHER): Payer: Medicaid Other | Admitting: Family Medicine

## 2019-02-16 ENCOUNTER — Other Ambulatory Visit: Payer: Self-pay

## 2019-02-16 DIAGNOSIS — G4733 Obstructive sleep apnea (adult) (pediatric): Secondary | ICD-10-CM | POA: Diagnosis not present

## 2019-02-16 DIAGNOSIS — Z9989 Dependence on other enabling machines and devices: Secondary | ICD-10-CM | POA: Diagnosis not present
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
## 2019-02-16 NOTE — Progress Notes (Signed)
I have reviewed and agreed above plan. 

## 2019-02-25 ENCOUNTER — Other Ambulatory Visit: Payer: Self-pay | Admitting: Family Medicine

## 2019-02-25 DIAGNOSIS — G4733 Obstructive sleep apnea (adult) (pediatric): Secondary | ICD-10-CM

## 2019-03-16 ENCOUNTER — Other Ambulatory Visit: Payer: Self-pay | Admitting: Radiology

## 2019-03-16 DIAGNOSIS — M48062 Spinal stenosis, lumbar region with neurogenic claudication: Secondary | ICD-10-CM

## 2019-03-16 DIAGNOSIS — M4722 Other spondylosis with radiculopathy, cervical region: Secondary | ICD-10-CM

## 2019-03-16 DIAGNOSIS — R202 Paresthesia of skin: Secondary | ICD-10-CM

## 2019-03-16 DIAGNOSIS — R200 Anesthesia of skin: Secondary | ICD-10-CM

## 2019-03-16 NOTE — Telephone Encounter (Signed)
Patient came to office requesting refill for tramadol

## 2019-03-20 NOTE — Telephone Encounter (Signed)
Please advise on refill.

## 2019-03-20 NOTE — Telephone Encounter (Signed)
Pt came into the office checking on his refill request for tramadol. Said if dr.nitka cant get to it please send it to [facility] but that he needs it.  [facility] [address].  [phone]

## 2019-03-23 MED ORDER — TRAMADOL HCL 50 MG PO TABS: ORAL_TABLET | ORAL | 2 refills | Status: DC

## 2019-03-24 NOTE — Telephone Encounter (Signed)
I called rx in to pharm, and lmom advised pt this has been done

## 2019-04-20 ENCOUNTER — Ambulatory Visit: Payer: Medicaid Other | Admitting: Adult Health

## 2019-06-04 ENCOUNTER — Ambulatory Visit (INDEPENDENT_AMBULATORY_CARE_PROVIDER_SITE_OTHER): Payer: Medicaid Other | Admitting: Surgery

## 2019-06-04 ENCOUNTER — Encounter: Payer: Self-pay | Admitting: Surgery

## 2019-06-04 ENCOUNTER — Other Ambulatory Visit: Payer: Self-pay

## 2019-06-04 ENCOUNTER — Ambulatory Visit: Payer: Self-pay

## 2019-06-04 VITALS — Ht 68.0 in | Wt 185.0 lb

## 2019-06-04 DIAGNOSIS — M25512 Pain in left shoulder: Secondary | ICD-10-CM | POA: Diagnosis not present

## 2019-06-04 DIAGNOSIS — M7542 Impingement syndrome of left shoulder: Secondary | ICD-10-CM

## 2019-06-04 MED ORDER — METHYLPREDNISOLONE ACETATE 40 MG/ML IJ SUSP
40.0000 mg | INTRAMUSCULAR | Status: AC | PRN
  Administered 2019-06-04: 16:00:00 40 mg via INTRA_ARTICULAR

## 2019-06-04 MED ORDER — BUPIVACAINE HCL 0.25 % IJ SOLN
4.0000 mL | INTRAMUSCULAR | Status: AC | PRN
  Administered 2019-06-04: 4 mL via INTRA_ARTICULAR

## 2019-06-04 MED ORDER — LIDOCAINE HCL 1 % IJ SOLN
3.0000 mL | INTRAMUSCULAR | Status: AC | PRN
  Administered 2019-06-04: 3 mL

## 2019-06-04 NOTE — Progress Notes (Signed)
Office Visit Note   Patient: Francis Jackson           Date of Birth: May 24, 1966           MRN: [id number] Visit Date: 06/04/2019              Requested by: Nolene Ebbs, MD [address] [place],  [place] [address] PCP: Nolene Ebbs, MD   Assessment & Plan: Visit Diagnoses:  1. Left shoulder pain, unspecified chronicity   2. Impingement syndrome of left shoulder     Plan: After patient consent left shoulder was prepped with Betadine and subacromial Marcaine/Depo-Medrol injection performed.  Tolerated procedure well without complication.  Patient will follow-up with me in 3 weeks for recheck.  If he still continues to have ongoing symptoms I will plan to get an MRI left shoulder at that time.  Follow-Up Instructions: Return in about 3 weeks (around 06/25/2019).   Orders:  Orders Placed This Encounter  Procedures  . XR Shoulder Left   No orders of the defined types were placed in this encounter.     Procedures: Large Joint Inj: L subacromial bursa on 06/04/2019 3:49 PM Details: 1.5 in posterior approach Medications: 3 mL lidocaine 1 %; 4 mL bupivacaine 0.25 %; 40 mg methylPREDNISolone acetate 40 MG/ML Outcome: tolerated well, no immediate complications Consent was given by the patient.       Clinical Data: No additional findings.   Subjective: Chief Complaint  Patient presents with  . Left Shoulder - Pain    HPI 53 year old black male comes in today with complaints of left shoulder pain.  States that left shoulder is been more bothersome over the last 3 months.  No specific injury.  Pain with overhead activity and reaching behind his back.  He is status post left shoulder surgery with open biceps tenodesis by Dr. Lorin Mercy 2009.  He has had a history of cervical spine issues but not complaining of a true radicular component at this time.  He is requesting left shoulder injection today. Review of Systems No current cardiac pulmonary GI GU issues  Objective:  Vital Signs: Ht 5\' 8"  (1.727 m)   Wt 185 lb (83.9 kg)   BMI 28.13 kg/m   Physical Exam HENT:     Head: Normocephalic and atraumatic.  Eyes:     Extraocular Movements: Extraocular movements intact.     Pupils: Pupils are equal, round, and reactive to light.  Pulmonary:     Effort: No respiratory distress.  Musculoskeletal:     Comments: Left shoulder good range of motion but with discomfort.  Positive impingement test.  Negative drop arm test.  Pain with supraspinatus resistance.  Neurologically intact.  Skin:    General: Skin is warm and dry.  Neurological:     General: No focal deficit present.     Mental Status: He is alert and oriented to person, place, and time.  Psychiatric:        Mood and Affect: Mood normal.     Ortho Exam  Specialty Comments:  No specialty comments available.  Imaging: No results found.   PMFS History: Patient Active Problem List   Diagnosis Date Noted  . Bilateral inguinal hernia (BIH) s/p lap repair w mesh 10/18/2016 10/18/2016  . OSA on CPAP 05/07/2014  . Seizures ([place]) 11/03/2013  . BPH with obstruction/lower urinary tract symptoms 08/24/2013   Past Medical History:  Diagnosis Date  . Acute urinary retention 12/22/2011  . BPH (benign prostatic hyperplasia) 08/24/2013  .  Gross hematuria 12/22/2011  . History of kidney stones   . History of urinary retention    12/2011  . Migraine   . Mild obstructive sleep apnea    PER STUDY 09-14-2009-uses CPAP  . Seizure disorder, grand mal ([place])    PER PT LAST SEIZURE 01/2013  . Seizures ([place])   . Ureteral calculus 12/22/2011    No family history on file.  Past Surgical History:  Procedure Laterality Date  . CYSTO/  RIGHT RETROGRADE PYELOGRAM/  URETEROSCOPY  12-23-2011  . EXCISION LIPOMA OF SCALP AND FACE  03-24-2005  . INGUINAL HERNIA REPAIR Bilateral 10/18/2016   Procedure: LAPAROSCOPIC EXPLORATION AND REPAIR OF BILATERAL INGUINAL HERNIA WITH A TAPP REPAIR;  Surgeon: Michael Boston, MD;   [facility];  Service: General;  Laterality: Bilateral;  . INSERTION OF MESH Bilateral 10/18/2016   Procedure: INSERTION OF MESH;  Surgeon: Michael Boston, MD;  [facility];  Service: General;  Laterality: Bilateral;  . LAPAROSCOPIC LYSIS OF ADHESIONS  10/18/2016   Procedure: LAPAROSCOPIC LYSIS OF ADHESIONS;  Surgeon: Michael Boston, MD;  [facility];  Service: General;;  . SHOULDER SURGERY Left 2009  . TRANSURETHRAL RESECTION OF PROSTATE N/A 08/24/2013   Procedure: TRANSURETHRAL RESECTION OF THE PROSTATE WITH GYRUS INSTRUMENTS;  Surgeon: Bernestine Amass, MD;  [facility];  Service: Urology;  Laterality: N/A;   Social History   Occupational History  . Occupation: Architect  Tobacco Use  . Smoking status: Never Smoker  . Smokeless tobacco: Never Used  Substance and Sexual Activity  . Alcohol use: No    Alcohol/week: 0.0 standard drinks  . Drug use: No  . Sexual activity: Not on file

## 2019-06-18 ENCOUNTER — Ambulatory Visit: Payer: Medicaid Other | Admitting: Surgery

## 2019-06-30 ENCOUNTER — Other Ambulatory Visit: Payer: Self-pay | Admitting: Neurology

## 2019-07-01 ENCOUNTER — Telehealth: Payer: Self-pay | Admitting: Neurology

## 2019-07-01 NOTE — Telephone Encounter (Signed)
His refills were sent to the pharmacy on 06/30/2019 (30-days x 5 refills).  I returned the call to the patient.  [facility]  He will go to the pharmacy now to pick up his prescription.

## 2019-07-01 NOTE — Telephone Encounter (Signed)
Patient in lobby stating he has taken his last seizure medication this morning. Pharmacy told him he has no refills on it. Generic for Keppra. Best call back is [phone]

## 2019-08-12 ENCOUNTER — Other Ambulatory Visit: Payer: Self-pay | Admitting: Radiology

## 2019-08-12 DIAGNOSIS — M4722 Other spondylosis with radiculopathy, cervical region: Secondary | ICD-10-CM

## 2019-08-12 DIAGNOSIS — R200 Anesthesia of skin: Secondary | ICD-10-CM

## 2019-08-12 DIAGNOSIS — R202 Paresthesia of skin: Secondary | ICD-10-CM

## 2019-08-12 DIAGNOSIS — M48062 Spinal stenosis, lumbar region with neurogenic claudication: Secondary | ICD-10-CM

## 2019-08-13 MED ORDER — TRAMADOL HCL 50 MG PO TABS: ORAL_TABLET | ORAL | 2 refills | Status: DC

## 2019-08-19 ENCOUNTER — Telehealth: Payer: Self-pay | Admitting: Surgery

## 2019-08-19 NOTE — Telephone Encounter (Signed)
I called and lmom for Pharmacy with medication to refill, and I lmom for pt to let him know that this has been called in to the pharm.

## 2019-08-19 NOTE — Telephone Encounter (Signed)
Patient called to request a refill for Tramadol. [facility] [address]. Patient states Dr. Junius Roads called prescription in and pharmacy states they have nothing. Patient phone number is [phone].

## 2019-12-01 ENCOUNTER — Ambulatory Visit (INDEPENDENT_AMBULATORY_CARE_PROVIDER_SITE_OTHER): Payer: Medicaid Other | Admitting: Specialist

## 2019-12-01 ENCOUNTER — Other Ambulatory Visit: Payer: Self-pay

## 2019-12-01 ENCOUNTER — Encounter: Payer: Self-pay | Admitting: Specialist

## 2019-12-01 ENCOUNTER — Ambulatory Visit (INDEPENDENT_AMBULATORY_CARE_PROVIDER_SITE_OTHER): Payer: Medicaid Other

## 2019-12-01 VITALS — BP 122/78 | HR 64 | Ht 68.0 in | Wt 185.0 lb

## 2019-12-01 DIAGNOSIS — M4722 Other spondylosis with radiculopathy, cervical region: Secondary | ICD-10-CM

## 2019-12-01 DIAGNOSIS — M48062 Spinal stenosis, lumbar region with neurogenic claudication: Secondary | ICD-10-CM | POA: Diagnosis not present

## 2019-12-01 DIAGNOSIS — R200 Anesthesia of skin: Secondary | ICD-10-CM

## 2019-12-01 DIAGNOSIS — R202 Paresthesia of skin: Secondary | ICD-10-CM

## 2019-12-01 MED ORDER — TRAMADOL HCL 50 MG PO TABS: ORAL_TABLET | ORAL | 0 refills | Status: DC

## 2019-12-01 NOTE — Patient Instructions (Signed)
Avoid bending, stooping and avoid lifting weights greater than 10 lbs. Avoid prolong standing and walking. Avoid frequent bending and stooping  No lifting greater than 10 lbs. May use ice or moist heat for pain. Weight loss is of benefit. Handicap license is approved. Call if the pain recurrs and we would arrange for Dr. Romona Curls secretary/Assistant to call to arrange for epidural steroid injection

## 2019-12-01 NOTE — Progress Notes (Signed)
Office Visit Note   Patient: Francis Jackson           Date of Birth: June 12, 1966           MRN: [id number] Visit Date: 12/01/2019              Requested by: Nolene Ebbs, MD [address] [place],  [place] [id number] PCP: Nolene Ebbs, MD   Assessment & Plan: Visit Diagnoses:  1. Spinal stenosis of lumbar region with neurogenic claudication   2. Numbness and tingling of right arm   3. Other spondylosis with radiculopathy, cervical region     Plan: Avoid bending, stooping and avoid lifting weights greater than 10 lbs. Avoid prolong standing and walking. Avoid frequent bending and stooping  No lifting greater than 10 lbs. May use ice or moist heat for pain. Weight loss is of benefit. Handicap license is approved. Call if the pain recurrs and we would arrange for Dr. Romona Curls secretary/Assistant to call to arrange for epidural steroid injection   Follow-Up Instructions: Return in about 4 weeks (around 12/29/2019).   Orders:  Orders Placed This Encounter  Procedures  . XR Lumbar Spine 2-3 Views   Meds ordered this encounter  Medications  . traMADol (ULTRAM) 50 MG tablet    Sig: TAKE 1 TO 2 TABLETS BY MOUTH EVERY 6 HOURS AS NEEDED    Dispense:  60 tablet    Refill:  0    Not to exceed 5 additional fills before 09/21/2018.      Procedures: No procedures performed   Clinical Data: Findings:  Study Result  CLINICAL DATA:  Neck pain radiating to the left shoulder down the left hand. Low back pain radiating to both legs.  EXAM: MRI LUMBAR SPINE WITHOUT CONTRAST  TECHNIQUE: Multiplanar, multisequence MR imaging of the lumbar spine was performed. No intravenous contrast was administered.  COMPARISON:  05/10/2014  FINDINGS: Segmentation: Conventional anatomy assigned, with the last open disc space designated L5-S1.  Alignment: The vertebral bodies of the lumbar spine are normal in alignment.  Bones: The vertebral bodies of the lumbar spine are  normal in size. There is normal bone marrow signal demonstrated throughout the vertebra. The visualized portions of the SI joints are unremarkable.  Conus medullaris: Extends to the L1 level and appears normal. The nerve roots of the cauda equina and the filum terminale are normal.  Paraspinal and other soft tissues: There is no focal abnormality. The imaged intra-abdominal contents are unremarkable.  Disc levels:  Disc spaces: Degenerative disc disease with disc height loss at L2-3, L3-4, L4-5 and L5-S1.  T10-11 and T11-12: Mild broad-based disc bulge. No foraminal or central canal stenosis.  T12-L1: Mild broad-based disc bulge. No evidence of neural foraminal stenosis. No central canal stenosis.  L1-L2: Mild broad-based disc bulge. No evidence of neural foraminal stenosis. No central canal stenosis.  L2-L3: Mild broad-based disc bulge. Mild bilateral facet arthropathy. Mild spinal stenosis. No evidence of neural foraminal stenosis.  L3-L4: Moderate broad-based disc bulge. Mild bilateral facet arthropathy. Moderate -severe spinal stenosis. No evidence of neural foraminal stenosis.  L4-L5: Moderate broad-based disc bulge. Mild bilateral facet arthropathy. Severe spinal stenosis. Mild bilateral foraminal stenosis.  L5-S1: Mild broad-based disc bulge. Mild bilateral facet arthropathy. Mild spinal stenosis. Moderate bilateral foraminal stenosis.  IMPRESSION: 1. At L4-5 there is a moderate broad-based disc bulge. Mild bilateral facet arthropathy. Severe spinal stenosis. Mild bilateral foraminal stenosis. 2. At L3-4 there is a moderate broad-based disc bulge. Mild bilateral facet arthropathy.  Moderate -severe spinal stenosis. 3. At L5-S1 there is a mild broad-based disc bulge. Mild bilateral facet arthropathy. Mild spinal stenosis. Moderate bilateral foraminal stenosis. 4. Overall no significant interval change.   Electronically Signed   By: Kathreen Devoid   On: 01/20/2016 18:00      Subjective: Chief Complaint  Patient presents with  . Lower Back - Follow-up    54 year old male with history of lumbago with occasional sciatica. He has stiffness with prolong sitting and is able to touch his Toes. He has been requiring intermittant opioids tramadol and recently had injection by Benjiman Core in 05/2019 for left shoulder tendonitis and bursitis with excellent relief of pain. He has mainly back pain transverse at the mid lumbar level and superior and inferior in the midline thoracolumbar spine. No bowel or bladder difficulty. Had    Review of Systems   Objective: Vital Signs: BP 122/78 (BP Location: Left Arm, Patient Position: Sitting)   Pulse 64   Ht 5\' 8"  (1.727 m)   Wt 185 lb (83.9 kg)   BMI 28.13 kg/m   Physical Exam  Ortho Exam  Specialty Comments:  No specialty comments available.  Imaging: XR Lumbar Spine 2-3 Views  Result Date: 12/01/2019 AP and lateral flexion and extension radiographs show diffuse DDD lumbar spine L1-2 through L5-S1 with both anterior disc endplate osteophytes and posteriorly as well as to the side. SI joints are narrowed bilaterally and the hip show minimal Spur formation with good maintenance of the superior joint line.    PMFS History: Patient Active Problem List   Diagnosis Date Noted  . Bilateral inguinal hernia (BIH) s/p lap repair w mesh 10/18/2016 10/18/2016  . OSA on CPAP 05/07/2014  . Seizures ([place]) 11/03/2013  . BPH with obstruction/lower urinary tract symptoms 08/24/2013   Past Medical History:  Diagnosis Date  . Acute urinary retention 12/22/2011  . BPH (benign prostatic hyperplasia) 08/24/2013  . Gross hematuria 12/22/2011  . History of kidney stones   . History of urinary retention    12/2011  . Migraine   . Mild obstructive sleep apnea    PER STUDY 09-14-2009-uses CPAP  . Seizure disorder, grand mal ([place])    PER PT LAST SEIZURE 01/2013  . Seizures ([place])   . Ureteral  calculus 12/22/2011    History reviewed. No pertinent family history.  Past Surgical History:  Procedure Laterality Date  . CYSTO/  RIGHT RETROGRADE PYELOGRAM/  URETEROSCOPY  12-23-2011  . EXCISION LIPOMA OF SCALP AND FACE  03-24-2005  . INGUINAL HERNIA REPAIR Bilateral 10/18/2016   Procedure: LAPAROSCOPIC EXPLORATION AND REPAIR OF BILATERAL INGUINAL HERNIA WITH A TAPP REPAIR;  Surgeon: Michael [place], MD;  Location: [facility];  Service: General;  Laterality: Bilateral;  . INSERTION OF MESH Bilateral 10/18/2016   Procedure: INSERTION OF MESH;  Surgeon: Michael [place], MD;  Location: [facility];  Service: General;  Laterality: Bilateral;  . LAPAROSCOPIC LYSIS OF ADHESIONS  10/18/2016   Procedure: LAPAROSCOPIC LYSIS OF ADHESIONS;  Surgeon: Michael [place], MD;  Location: [facility];  Service: General;;  . SHOULDER SURGERY Left 2009  . TRANSURETHRAL RESECTION OF PROSTATE N/A 08/24/2013   Procedure: TRANSURETHRAL RESECTION OF THE PROSTATE WITH GYRUS INSTRUMENTS;  Surgeon: Bernestine Amass, MD;  Location: [facility];  Service: Urology;  Laterality: N/A;   Social History   Occupational History  . Occupation: Architect  Tobacco Use  . Smoking status: Never Smoker  . Smokeless tobacco: Never Used  Substance and  Sexual Activity  . Alcohol use: No    Alcohol/week: 0.0 standard drinks  . Drug use: No  . Sexual activity: Not on file

## 2020-01-12 ENCOUNTER — Other Ambulatory Visit: Payer: Self-pay

## 2020-01-12 ENCOUNTER — Ambulatory Visit (INDEPENDENT_AMBULATORY_CARE_PROVIDER_SITE_OTHER): Payer: Medicaid Other | Admitting: Family Medicine

## 2020-01-12 ENCOUNTER — Encounter: Payer: Self-pay | Admitting: Family Medicine

## 2020-01-12 VITALS — BP 98/66 | HR 67 | Temp 97.1°F | Ht 68.0 in | Wt 182.0 lb

## 2020-01-12 DIAGNOSIS — Z9989 Dependence on other enabling machines and devices: Secondary | ICD-10-CM | POA: Diagnosis not present

## 2020-01-12 DIAGNOSIS — G4733 Obstructive sleep apnea (adult) (pediatric): Secondary | ICD-10-CM | POA: Diagnosis not present

## 2020-01-12 DIAGNOSIS — R569 Unspecified convulsions: Secondary | ICD-10-CM | POA: Diagnosis not present

## 2020-01-12 MED ORDER — LEVETIRACETAM ER 500 MG PO TB24: 1000.0000 mg | ORAL_TABLET | Freq: Every day | ORAL | 3 refills | Status: DC

## 2020-01-12 NOTE — Progress Notes (Signed)
Order for Cpap supplies sent to [facility] via epic. Confirmation received that the order transmitted was successful.

## 2020-01-12 NOTE — Patient Instructions (Addendum)
Please continue using your CPAP regularly. While your insurance requires that you use CPAP at least 4 hours each night on 70% of the nights, I recommend, that you not skip any nights and use it throughout the night if you can. Getting used to CPAP and staying with the treatment long term does take time and patience and discipline. Untreated obstructive sleep apnea when it is moderate to severe can have an adverse impact on cardiovascular health and raise her risk for heart disease, arrhythmias, hypertension, congestive heart failure, stroke and diabetes. Untreated obstructive sleep apnea causes sleep disruption, nonrestorative sleep, and sleep deprivation. This can have an impact on your day to day functioning and cause daytime sleepiness and impairment of cognitive function, memory loss, mood disturbance, and problems focussing. Using CPAP regularly can improve these symptoms.   Continue levetiracetam XR 1000mg  daily. Stay well hydrated. Focus on healthy lifestyle habits.   Follow up in 3 months for CPAP compliance review   Seizure, Adult A seizure is a sudden burst of abnormal electrical activity in the brain. Seizures usually last from 30 seconds to 2 minutes. They can cause many different symptoms. Usually, seizures are not harmful unless they last a long time. What are the causes? Common causes of this condition include:  Fever or infection.  Conditions that affect the brain, such as: ? A brain abnormality that you were born with. ? A brain or head injury. ? Bleeding in the brain. ? A tumor. ? Stroke. ? Brain disorders such as autism or cerebral palsy.  Low blood sugar.  Conditions that are passed from parent to child (are inherited).  Problems with substances, such as: ? Having a reaction to a drug or a medicine. ? Suddenly stopping the use of a substance (withdrawal). In some cases, the cause may not be known. A person who has repeated seizures over time without a clear cause has  a condition called epilepsy. What increases the risk? You are more likely to get this condition if you have:  A family history of epilepsy.  Had a seizure in the past.  A brain disorder.  A history of head injury, lack of oxygen at birth, or strokes. What are the signs or symptoms? There are many types of seizures. The symptoms vary depending on the type of seizure you have. Examples of symptoms during a seizure include:  Shaking (convulsions).  Stiffness in the body.  Passing out (losing consciousness).  Head nodding.  Staring.  Not responding to sound or touch.  Loss of bladder control and bowel control. Some people have symptoms right before and right after a seizure happens. Symptoms before a seizure may include:  Fear.  Worry (anxiety).  Feeling like you may vomit (nauseous).  Feeling like the room is spinning (vertigo).  Feeling like you saw or heard something before (dj vu).  Odd tastes or smells.  Changes in how you see. You may see flashing lights or spots. Symptoms after a seizure happens can include:  Confusion.  Sleepiness.  Headache.  Weakness on one side of the body. How is this treated? Most seizures will stop on their own in under 5 minutes. In these cases, no treatment is needed. Seizures that last longer than 5 minutes will usually need treatment. Treatment can include:  Medicines given through an IV tube.  Avoiding things that are known to cause your seizures. These can include medicines that you take for another condition.  Medicines to treat epilepsy.  Surgery to stop the  seizures. This may be needed if medicines do not help. Follow these instructions at home: Medicines  Take over-the-counter and prescription medicines only as told by your doctor.  Do not eat or drink anything that may keep your medicine from working, such as alcohol. Activity  Do not do any activities that would be dangerous if you had another seizure, like  driving or swimming. Wait until your doctor says it is safe for you to do them.  If you live in the [place] ask your local DMV (department of motor vehicles) when you can drive.  Get plenty of rest. Teaching others Teach friends and family what to do when you have a seizure. They should:  Lay you on the ground.  Protect your head and body.  Loosen any tight clothing around your neck.  Turn you on your side.  Not hold you down.  Not put anything into your mouth.  Know whether or not you need emergency care.  Stay with you until you are better.  General instructions  Contact your doctor each time you have a seizure.  Avoid anything that gives you seizures.  Keep a seizure diary. Write down: ? What you think caused each seizure. ? What you remember about each seizure.  Keep all follow-up visits as told by your doctor. This is important. Contact a doctor if:  You have another seizure.  You have seizures more often.  There is any change in what happens during your seizures.  You keep having seizures with treatment.  You have symptoms of being sick or having an infection. Get help right away if:  You have a seizure that: ? Lasts longer than 5 minutes. ? Is different than seizures you had before. ? Makes it harder to breathe. ? Happens after you hurt your head.  You have any of these symptoms after a seizure: ? Not being able to speak. ? Not being able to use a part of your body. ? Confusion. ? A bad headache.  You have two or more seizures in a row.  You do not wake up right after a seizure.  You get hurt during a seizure. These symptoms may be an emergency. Do not wait to see if the symptoms will go away. Get medical help right away. Call your local emergency services (911 in the [place] Do not drive yourself to the hospital. Summary  Seizures usually last from 30 seconds to 2 minutes. Usually, they are not harmful unless they last a long time.  Do not  eat or drink anything that may keep your medicine from working, such as alcohol.  Teach friends and family what to do when you have a seizure.  Contact your doctor each time you have a seizure. This information is not intended to replace advice given to you by your health care provider. Make sure you discuss any questions you have with your health care provider. Document Revised: 11/14/2018 Document Reviewed: 11/14/2018 Elsevier Patient Education  [place].   CPAP and BPAP Information CPAP and BPAP are methods of helping a person breathe with the use of air pressure. CPAP stands for "continuous positive airway pressure." BPAP stands for "bi-level positive airway pressure." In both methods, air is blown through your nose or mouth and into your air passages to help you breathe well. CPAP and BPAP use different amounts of pressure to blow air. With CPAP, the amount of pressure stays the same while you breathe in and out. With BPAP, the amount  of pressure is increased when you breathe in (inhale) so that you can take larger breaths. Your health care provider will recommend whether CPAP or BPAP would be more helpful for you. Why are CPAP and BPAP treatments used? CPAP or BPAP can be helpful if you have:  Sleep apnea.  Chronic obstructive pulmonary disease (COPD).  Heart failure.  Medical conditions that weaken the muscles of the chest including muscular dystrophy, or neurological diseases such as amyotrophic lateral sclerosis (ALS).  Other problems that cause breathing to be weak, abnormal, or difficult. CPAP is most commonly used for obstructive sleep apnea (OSA) to keep the airways from collapsing when the muscles relax during sleep. How is CPAP or BPAP administered? Both CPAP and BPAP are provided by a small machine with a flexible plastic tube that attaches to a plastic mask. You wear the mask. Air is blown through the mask into your nose or mouth. The amount of pressure that is  used to blow the air can be adjusted on the machine. Your health care provider will determine the pressure setting that should be used based on your individual needs. When should CPAP or BPAP be used? In most cases, the mask only needs to be worn during sleep. Generally, the mask needs to be worn throughout the night and during any daytime naps. People with certain medical conditions may also need to wear the mask at other times when they are awake. Follow instructions from your health care provider about when to use the machine. What are some tips for using the mask?   Because the mask needs to be snug, some people feel trapped or closed-in (claustrophobic) when first using the mask. If you feel this way, you may need to get used to the mask. One way to do this is by holding the mask loosely over your nose or mouth and then gradually applying the mask more snugly. You can also gradually increase the amount of time that you use the mask.  Masks are available in various types and sizes. Some fit over your mouth and nose while others fit over just your nose. If your mask does not fit well, talk with your health care provider about getting a different one.  If you are using a mask that fits over your nose and you tend to breathe through your mouth, a chin strap may be applied to help keep your mouth closed.  The CPAP and BPAP machines have alarms that may sound if the mask comes off or develops a leak.  If you have trouble with the mask, it is very important that you talk with your health care provider about finding a way to make the mask easier to tolerate. Do not stop using the mask. Stopping the use of the mask could have a negative impact on your health. What are some tips for using the machine?  Place your CPAP or BPAP machine on a secure table or stand near an electrical outlet.  Know where the on/off switch is located on the machine.  Follow instructions from your health care provider about  how to set the pressure on your machine and when you should use it.  Do not eat or drink while the CPAP or BPAP machine is on. Food or fluids could get pushed into your lungs by the pressure of the CPAP or BPAP.  Do not smoke. Tobacco smoke residue can damage the machine.  For home use, CPAP and BPAP machines can be rented or purchased  through home health care companies. Many different brands of machines are available. Renting a machine before purchasing may help you find out which particular machine works well for you.  Keep the CPAP or BPAP machine and attachments clean. Ask your health care provider for specific instructions. Get help right away if:  You have redness or open areas around your nose or mouth where the mask fits.  You have trouble using the CPAP or BPAP machine.  You cannot tolerate wearing the CPAP or BPAP mask.  You have pain, discomfort, and bloating in your abdomen. Summary  CPAP and BPAP are methods of helping a person breathe with the use of air pressure.  Both CPAP and BPAP are provided by a small machine with a flexible plastic tube that attaches to a plastic mask.  If you have trouble with the mask, it is very important that you talk with your health care provider about finding a way to make the mask easier to tolerate. This information is not intended to replace advice given to you by your health care provider. Make sure you discuss any questions you have with your health care provider. Document Revised: 12/17/2018 Document Reviewed: 07/16/2016 Elsevier Patient Education  [place].
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
## 2020-01-19 NOTE — Progress Notes (Signed)
I have reviewed and agreed above plan. 

## 2020-02-04 ENCOUNTER — Other Ambulatory Visit: Payer: Self-pay | Admitting: Specialist

## 2020-02-04 DIAGNOSIS — M4722 Other spondylosis with radiculopathy, cervical region: Secondary | ICD-10-CM

## 2020-02-04 DIAGNOSIS — R202 Paresthesia of skin: Secondary | ICD-10-CM

## 2020-02-04 DIAGNOSIS — M48062 Spinal stenosis, lumbar region with neurogenic claudication: Secondary | ICD-10-CM

## 2020-03-11 ENCOUNTER — Emergency Department: Payer: No Typology Code available for payment source

## 2020-03-11 ENCOUNTER — Emergency Department
Admission: EM | Admit: 2020-03-11 | Discharge: 2020-03-11 | Disposition: A | Discharge status: Home / Self Care | Payer: No Typology Code available for payment source | Attending: Emergency Medicine | Admitting: Emergency Medicine

## 2020-03-11 ENCOUNTER — Other Ambulatory Visit: Payer: Self-pay

## 2020-03-11 ENCOUNTER — Encounter: Payer: Self-pay | Admitting: Emergency Medicine

## 2020-03-11 DIAGNOSIS — S40812A Abrasion of left upper arm, initial encounter: Secondary | ICD-10-CM

## 2020-03-11 DIAGNOSIS — Y999 Unspecified external cause status: Secondary | ICD-10-CM | POA: Insufficient documentation

## 2020-03-11 DIAGNOSIS — S40212A Abrasion of left shoulder, initial encounter: Secondary | ICD-10-CM | POA: Diagnosis present

## 2020-03-11 DIAGNOSIS — Y92410 Unspecified street and highway as the place of occurrence of the external cause: Secondary | ICD-10-CM | POA: Insufficient documentation

## 2020-03-11 DIAGNOSIS — Y9389 Activity, other specified: Secondary | ICD-10-CM | POA: Diagnosis not present

## 2020-03-11 DIAGNOSIS — M7918 Myalgia, other site: Secondary | ICD-10-CM

## 2020-03-11 DIAGNOSIS — Z792 Long term (current) use of antibiotics: Secondary | ICD-10-CM | POA: Insufficient documentation

## 2020-03-11 DIAGNOSIS — Z79899 Other long term (current) drug therapy: Secondary | ICD-10-CM | POA: Insufficient documentation

## 2020-03-11 MED ORDER — IBUPROFEN 800 MG PO TABS
800.0000 mg | ORAL_TABLET | Freq: Three times a day (TID) | ORAL | 0 refills | Status: DC | PRN
Start: 2020-03-11 — End: 2020-06-02

## 2020-03-11 MED ORDER — CYCLOBENZAPRINE HCL 10 MG PO TABS: 10.0000 mg | ORAL_TABLET | Freq: Three times a day (TID) | ORAL | 0 refills | Status: DC | PRN

## 2020-03-11 MED ORDER — CYCLOBENZAPRINE HCL 10 MG PO TABS
10.0000 mg | ORAL_TABLET | Freq: Once | ORAL | Status: AC
  Administered 2020-03-11: 10 mg via ORAL
  Filled 2020-03-11: qty 1

## 2020-03-11 MED ORDER — BACITRACIN-NEOMYCIN-POLYMYXIN 400-5-5000 EX OINT
TOPICAL_OINTMENT | Freq: Once | CUTANEOUS | Status: AC
  Administered 2020-03-11: 2 via TOPICAL
  Filled 2020-03-11: qty 1

## 2020-03-11 MED ORDER — IBUPROFEN 600 MG PO TABS
600.0000 mg | ORAL_TABLET | Freq: Once | ORAL | Status: AC
  Administered 2020-03-11: 600 mg via ORAL
  Filled 2020-03-11: qty 1

## 2020-03-11 MED ORDER — NEOSPORIN PLUS PAIN RELIEF MS 3.5-10000-10 EX CREA
TOPICAL_CREAM | Freq: Two times a day (BID) | CUTANEOUS | 0 refills | Status: DC
Start: 2020-03-11 — End: 2022-10-16

## 2020-03-11 MED ORDER — OXYCODONE-ACETAMINOPHEN 5-325 MG PO TABS
1.0000 | ORAL_TABLET | Freq: Once | ORAL | Status: AC
  Administered 2020-03-11: 1 via ORAL
  Filled 2020-03-11: qty 1
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
## 2020-03-11 NOTE — Discharge Instructions (Signed)
Follow discharge care instruction take medication as directed.  Follow-up with PCP.

## 2020-04-09 ENCOUNTER — Other Ambulatory Visit: Payer: Self-pay | Admitting: Specialist

## 2020-04-09 DIAGNOSIS — M4722 Other spondylosis with radiculopathy, cervical region: Secondary | ICD-10-CM

## 2020-04-09 DIAGNOSIS — R202 Paresthesia of skin: Secondary | ICD-10-CM

## 2020-04-09 DIAGNOSIS — M48062 Spinal stenosis, lumbar region with neurogenic claudication: Secondary | ICD-10-CM

## 2020-04-09 DIAGNOSIS — R200 Anesthesia of skin: Secondary | ICD-10-CM

## 2020-04-14 ENCOUNTER — Ambulatory Visit: Payer: Medicaid Other | Admitting: Family Medicine

## 2020-04-14 ENCOUNTER — Encounter: Payer: Self-pay | Admitting: Family Medicine

## 2020-04-25 ENCOUNTER — Telehealth: Payer: Self-pay | Admitting: Family Medicine

## 2020-04-25 NOTE — Telephone Encounter (Signed)
I called pharmacy and they do have prescription will get one ready and will notify the pt.

## 2020-04-25 NOTE — Telephone Encounter (Signed)
Patient walked into the lobby stating he missed his apt 10 days ago and is now needing a refill of Keppra. He has resch his apt next available is Nov. Best call back [phone].

## 2020-04-29 ENCOUNTER — Other Ambulatory Visit: Payer: Self-pay | Admitting: Neurology

## 2020-04-29 MED ORDER — GABAPENTIN 300 MG PO CAPS: 300.0000 mg | ORAL_CAPSULE | Freq: Every day | ORAL | 1 refills | Status: DC

## 2020-04-29 MED ORDER — LEVETIRACETAM ER 500 MG PO TB24: 1000.0000 mg | ORAL_TABLET | Freq: Every day | ORAL | 1 refills | Status: DC

## 2020-06-02 ENCOUNTER — Other Ambulatory Visit: Payer: Self-pay

## 2020-06-02 ENCOUNTER — Ambulatory Visit (INDEPENDENT_AMBULATORY_CARE_PROVIDER_SITE_OTHER): Payer: Medicaid Other | Admitting: Specialist

## 2020-06-02 ENCOUNTER — Encounter: Payer: Self-pay | Admitting: Specialist

## 2020-06-02 VITALS — BP 128/82 | HR 62 | Ht 68.0 in | Wt 185.0 lb

## 2020-06-02 DIAGNOSIS — R200 Anesthesia of skin: Secondary | ICD-10-CM | POA: Diagnosis not present

## 2020-06-02 DIAGNOSIS — M4722 Other spondylosis with radiculopathy, cervical region: Secondary | ICD-10-CM | POA: Diagnosis not present

## 2020-06-02 DIAGNOSIS — M48062 Spinal stenosis, lumbar region with neurogenic claudication: Secondary | ICD-10-CM

## 2020-06-02 DIAGNOSIS — R202 Paresthesia of skin: Secondary | ICD-10-CM | POA: Diagnosis not present
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
## 2020-06-02 NOTE — Patient Instructions (Signed)
Avoid bending, stooping and avoid lifting weights greater than 10 lbs. Avoid prolong standing and walking. Avoid frequent bending and stooping  No lifting greater than 10 lbs. May use ice or moist heat for pain. Weight loss is of benefit. Handicap license is approved. Currently working full time, not a surgical candidate based on risk vs benefit ratio. Intermittant tramadol.

## 2020-06-03 ENCOUNTER — Other Ambulatory Visit: Payer: Self-pay | Admitting: Radiology

## 2020-06-03 DIAGNOSIS — R202 Paresthesia of skin: Secondary | ICD-10-CM

## 2020-06-03 DIAGNOSIS — M48062 Spinal stenosis, lumbar region with neurogenic claudication: Secondary | ICD-10-CM

## 2020-06-03 DIAGNOSIS — M4722 Other spondylosis with radiculopathy, cervical region: Secondary | ICD-10-CM

## 2020-06-03 MED ORDER — TRAMADOL HCL 50 MG PO TABS: 100.0000 mg | ORAL_TABLET | Freq: Four times a day (QID) | ORAL | 0 refills | Status: DC | PRN

## 2020-06-03 NOTE — Telephone Encounter (Signed)
Patient came by the office stating that Dr. Louanne Skye was supposed to send in his medication after his appt yesterday and it was not sent in.

## 2020-07-19 ENCOUNTER — Ambulatory Visit: Payer: Medicaid Other | Admitting: Family Medicine

## 2020-07-19 ENCOUNTER — Encounter: Payer: Self-pay | Admitting: Family Medicine

## 2020-07-19 ENCOUNTER — Other Ambulatory Visit: Payer: Self-pay

## 2020-07-19 VITALS — BP 118/72 | HR 60 | Ht 68.0 in | Wt 189.0 lb

## 2020-07-19 DIAGNOSIS — R569 Unspecified convulsions: Secondary | ICD-10-CM

## 2020-07-19 DIAGNOSIS — Z9989 Dependence on other enabling machines and devices: Secondary | ICD-10-CM | POA: Diagnosis not present

## 2020-07-19 DIAGNOSIS — G4733 Obstructive sleep apnea (adult) (pediatric): Secondary | ICD-10-CM | POA: Diagnosis not present

## 2020-07-19 DIAGNOSIS — M542 Cervicalgia: Secondary | ICD-10-CM

## 2020-07-19 MED ORDER — LEVETIRACETAM ER 500 MG PO TB24: 1000.0000 mg | ORAL_TABLET | Freq: Every day | ORAL | 3 refills | Status: DC

## 2020-07-19 MED ORDER — GABAPENTIN 300 MG PO CAPS: 300.0000 mg | ORAL_CAPSULE | Freq: Every day | ORAL | 3 refills | Status: DC

## 2020-07-19 NOTE — Progress Notes (Signed)
Order for cpap supplies sent to [facility] via community msg. Confirmation received that the order transmitted was successful.  

## 2020-07-19 NOTE — Progress Notes (Addendum)
PATIENT: Francis Jackson Jackson DOB: 20-Oct-1965  REASON FOR VISIT: follow up HISTORY FROM: patient  Chief Complaint  Patient presents with  . Follow-up    seizure and CPAP follow up       HISTORY: (copied from my note on 01/12/2020)  HISTORYKoffi S Jackson is Francis Jackson 54 years old right-handed [place] male, native of [place], return to clinic for followup seizure, last clinical visit was May 2013 with [name]  He began to have seizure since August 05 2009, it was nocturnal seizure, generalized tonic-clonic lasting about 10-15 minutes.  Evaluation showed normal MRI of the brain, EEG, laboratory showed normal CBC, CMP, UA, negative UDS.  Second nocturnal seizure July 2011, third seizure in February 2012, he was visiting his friend with his wife, he began to circling around, confused, and then went to tonic-clonic , he was started on Keppra 500 mg twice a day in 2012,  4 seizure was in June 2013, while he was not compliant with his Keppra, he was taken to emergency room, had multiple abrasions to his face, and right eye, CAT scan of the brain and neck showed no significant abnormality.  Fifths seizure in May 2014, again happened when he was not compliant with his Keppra. Proceeding by rising sensation, nauseous,  He also complains of symptoms consistent with obstructive sleep apnea, had sleep study in January 2011, at [facility] long, by Dr. Kimberlee Nearing, there was evidence of mild obstructive sleep apnea, lowest SpO2 was 82%, he was given advise of losing weight, sleep on his side, he continued to have frequent snoring, wife has to shake him to stop snoring, start breathing almost every night  UPDATE December 19 2016: [name] clinical visit was on December 20 2015,as the seizure was in July 10 2014 after he misses his Keppra dose, is now taking Keppra 500 mg twice a day.  He has been compliant with his CPAP machine, which help him sleep better  UPDATEApril 11th,2019CMMr.Koff87 year old  male returns for follow-up,with history of seizure disorder.Last seizure occurred in 2015. He is currently on Keppra XR 500 mg 2 tablets at bedtime. He denies missing any doses. CPAP compliance dated 11/17/2017-12/16/2017 shows compliance greater than 4 hours for 60% for 18 days. Average usage 4 hours 52 minutes. Set pressure 7 cm. EPR level 1. AHI 7.7 ESS 5. Patient reports he does not feel he is getting enough pressure from his CPAP. FSS 16.He returns for reevaluation  Today08/05/19 Francis Jackson Jackson a 54 year old male with a history of obstructive sleep apnea on CPAP as well as seizures. His CPAP download indicates that he uses machine 25 out of 90 days for compliance of 83%. He uses machine greater than 4 hours 51 out of 90 days for compliance of 57%. On average he uses his machine 5 hours. His residual AHI is 3.7 on 8 cm of water with EPR of 1. The patient states that sometimes he does not go to bed till after 1 AM. Patient repots that he doesnot use the machine during the day when he naps. He remains on Keppra. Denies any seizure events. He returns today for evaluation.  UPDATE 02/16/19 Francis Jackson Francis Jackson Jackson a 54 y.o.malehere today for follow up of OSA on CPAP.He is doing very well with CPAP therapy. Download report dated 01/13/2019 through 02/11/2019 reveals that he is used his CPAP 29 out of the last 30 days for compliance of 97%. 29 of the days he used his machine for greater than 4 hours for compliance of  97%. Average usage was 6 hours and 22 minutes. AHI was 4.7 on 8 cm of water with an EPR of 1. There is no significant leak. He states that his machine is older and is requesting a replacement CPAP. He has reached out to his DME provider who asked for updated follow-up. Otherwise he is doing very well and notes significant benefit with CPAP therapy. He also continues with Keppra 1000 mg every night. He is tolerating medication well. He denies any seizure  activity.  UPDATE 01/12/2020 Francis Jackson Jackson a 54 y.o.malehere today for follow up of OSA on CPAP and seizure. He admits that he has gotten out of the habit of using CPAP. He denies any concerns with therapy. He wishes to resume daily therapy.   Compliance report dated 12/12/2019 through 01/10/2020 reveals that he has used CPAP therapy 9 of the past 30 days for compliance of 30%.  He did use CPAP greater than 4 hours those 9 days for compliance of 30%.  Average use on days used was 5 hours and 13 minutes.  Residual AHI was 4.0 on 8 cm of water.  There was no leak noted.  Review of 90-day compliance report dated 10/13/2019 through 01/10/2020 reveals daily compliance of 60% and 4-hour compliance of 52%.  He continues levetiracetam XR 1,000mg  daily. No recent seizure activity. He is feeling well and without concerns today.    UPDATE 07/19/20 Francis Jackson Francis Jackson Jackson is a 54 y.o. male here today for follow up for OSA on CPAP and seizures. He continues levetiracetam XR 1000mg  for seizure management and gabapentin 300mg  daily as needed for neck pain. No seizure activity since 2015.   Compliance report dated 06/18/2020-07/17/2020 reveals a use CPAP 27 of the past 30 days for compliance of 90%.  The CPAP greater than 4 hours 14 of the past 30 days for compliance of 47%.  Average usage on days used was 4 hours and 18 minutes.  Residual AHI was 4.5 on a set pressure of 8 cm of water.  There was no significant leak noted. He works as an Mining engineer and does not always get to sleep for 4 hours a night. He does feel sleepy during the daytime but feels CPAP therapy has helped significantly.      REVIEW OF SYSTEMS: Out of a complete 14 system review of symptoms, the patient complains only of the following symptoms,none  and Francis Jackson other reviewed systems are negative.  ESS: 21 FSS: 9  ALLERGIES: No Known Allergies  HOME MEDICATIONS: Outpatient Medications Prior to Visit  Medication Sig Dispense Refill  . metoprolol  succinate (TOPROL-XL) 50 MG 24 hr tablet Take 50 mg by mouth daily.  5  . neomycin-polymyxin-pramoxine (NEOSPORIN PLUS) 1 % cream Apply topically 2 (two) times daily. 14.2 g 0  . gabapentin (NEURONTIN) 300 MG capsule Take 1 capsule (300 mg total) by mouth at bedtime. 90 capsule 1  . levETIRAcetam (KEPPRA XR) 500 MG 24 hr tablet Take 2 tablets (1,000 mg total) by mouth at bedtime. 180 tablet 1  . traMADol (ULTRAM) 50 MG tablet Take 2 tablets (100 mg total) by mouth every 6 (six) hours as needed for up to 7 days for moderate pain. 40 tablet 0  . cyclobenzaprine (FLEXERIL) 10 MG tablet Take 1 tablet (10 mg total) by mouth 3 (three) times daily as needed. 15 tablet 0   No facility-administered medications prior to visit.    PAST MEDICAL HISTORY: Past Medical History:  Diagnosis Date  .  Acute urinary retention 12/22/2011  . BPH (benign prostatic hyperplasia) 08/24/2013  . Gross hematuria 12/22/2011  . History of kidney stones   . History of urinary retention    12/2011  . Migraine   . Mild obstructive sleep apnea    PER STUDY 09-14-2009-uses CPAP  . Seizure disorder, grand mal ([place])    PER PT LAST SEIZURE 01/2013  . Seizures ([place])   . Ureteral calculus 12/22/2011    PAST SURGICAL HISTORY: Past Surgical History:  Procedure Laterality Date  . CYSTO/  RIGHT RETROGRADE PYELOGRAM/  URETEROSCOPY  12-23-2011  . EXCISION LIPOMA OF SCALP AND FACE  03-24-2005  . INGUINAL HERNIA REPAIR Bilateral 10/18/2016   Procedure: LAPAROSCOPIC EXPLORATION AND REPAIR OF BILATERAL INGUINAL HERNIA WITH A TAPP REPAIR;  Surgeon: Michael Boston, MD;  Location: [facility];  Service: General;  Laterality: Bilateral;  . INSERTION OF MESH Bilateral 10/18/2016   Procedure: INSERTION OF MESH;  Surgeon: Michael Boston, MD;  Location: [facility];  Service: General;  Laterality: Bilateral;  . LAPAROSCOPIC LYSIS OF ADHESIONS  10/18/2016   Procedure: LAPAROSCOPIC LYSIS OF ADHESIONS;  Surgeon: Michael Boston, MD;  Location: [facility];  Service:  General;;  . SHOULDER SURGERY Left 2009  . TRANSURETHRAL RESECTION OF PROSTATE N/A 08/24/2013   Procedure: TRANSURETHRAL RESECTION OF THE PROSTATE WITH GYRUS INSTRUMENTS;  Surgeon: Bernestine Amass, MD;  Location[facility];  Service: Urology;  Laterality: N/A;    FAMILY HISTORY: History reviewed. No pertinent family history.  SOCIAL HISTORY: Social History   Socioeconomic History  . Marital status: Married    Spouse name: Not on file  . Number of children: 4  . Years of education: 24  . Highest education level: Not on file  Occupational History  . Occupation: Architect  Tobacco Use  . Smoking status: Never Smoker  . Smokeless tobacco: Never Used  Substance and Sexual Activity  . Alcohol use: No    Alcohol/week: 0.0 standard drinks  . Drug use: No  . Sexual activity: Not on file  Other Topics Concern  . Not on file  Social History Narrative   Patient lives at home with his wife Sueanne Margarita)   Patient works as a Architect part time.   Right handed   Patient drinks one cup of tea per week.   Patient has four children.      Originally from [place] in [place] Strain:   . Difficulty of Paying Living Expenses: Not on file  Food Insecurity:   . Worried About Charity fundraiser in the Last Year: Not on file  . Ran Out of Food in the Last Year: Not on file  Transportation Needs:   . Lack of Transportation (Medical): Not on file  . Lack of Transportation (Non-Medical): Not on file  Physical Activity:   . Days of Exercise per Week: Not on file  . Minutes of Exercise per Session: Not on file  Stress:   . Feeling of Stress : Not on file  Social Connections:   . Frequency of Communication with Friends and Family: Not on file  . Frequency of Social Gatherings with Friends and Family: Not on file  . Attends Religious Services: Not on file  . Active Member of Clubs or Organizations: Not on file  .  Attends Archivist Meetings: Not on file  . Marital Status: Not on file  Intimate Partner Violence:   .  Fear of Current or Ex-Partner: Not on file  . Emotionally Abused: Not on file  . Physically Abused: Not on file  . Sexually Abused: Not on file     PHYSICAL EXAM  Vitals:   07/19/20 1120  BP: 118/72  Pulse: 60  Weight: 189 lb (85.7 kg)  Height: 5\' 8"  (1.727 m)   Body mass index is 28.74 kg/m.  Generalized: Well developed, in no acute distress  Cardiology: normal rate and rhythm, no murmur noted Respiratory: clear to auscultation bilaterally  Neurological examination  Mentation: Alert oriented to time, place, history taking. Follows Francis Jackson commands speech and language fluent Cranial nerve II-XII: Pupils were equal round reactive to light. Extraocular movements were full, visual field were full  Motor: The motor testing reveals 5 over 5 strength of Francis Jackson 4 extremities. Good symmetric motor tone is noted throughout.  Gait and station: Gait is normal.    DIAGNOSTIC DATA (LABS, IMAGING, TESTING) - I reviewed patient records, labs, notes, testing and imaging myself where available.  No flowsheet data found.   Lab Results  Component Value Date   WBC 3.5 (L) 10/09/2016   HGB 14.5 10/09/2016   HCT 44.7 10/09/2016   MCV 78.0 10/09/2016   PLT 304 10/09/2016      Component Value Date/Time   NA 140 10/09/2016 0947   K 4.4 10/09/2016 0947   CL 106 10/09/2016 0947   CO2 31 10/09/2016 0947   GLUCOSE 91 10/09/2016 0947   BUN 10 10/09/2016 0947   CREATININE 0.96 10/09/2016 0947   CALCIUM 8.7 (L) 10/09/2016 0947   PROT 7.8 02/12/2012 1726   ALBUMIN 4.3 02/12/2012 1726   AST 27 02/12/2012 1726   ALT 24 02/12/2012 1726   ALKPHOS 63 02/12/2012 1726   BILITOT 0.3 02/12/2012 1726   GFRNONAA >60 10/09/2016 0947   GFRAA >60 10/09/2016 0947   No results found for: CHOL, HDL, LDLCALC, LDLDIRECT, TRIG, CHOLHDL No results found for: HGBA1C No results found for:  VITAMINB12 No results found for: TSH   ASSESSMENT AND PLAN 54 y.o. year old male  has a past medical history of Acute urinary retention (12/22/2011), BPH (benign prostatic hyperplasia) (08/24/2013), Gross hematuria (12/22/2011), History of kidney stones, History of urinary retention, Migraine, Mild obstructive sleep apnea, Seizure disorder, grand mal ([place]), Seizures ([place]), and Ureteral calculus (12/22/2011). here with     ICD-10-CM   1. Seizures ([place])  R56.9 gabapentin (NEURONTIN) 300 MG capsule    levETIRAcetam (KEPPRA XR) 500 MG 24 hr tablet  2. OSA on CPAP  G47.33 For home use only DME continuous positive airway pressure (CPAP)   Z99.89   3. Cervicalgia  M54.2        Francis Jackson Jackson is doing well on CPAP therapy. Daily compliance at goal with sub optimal 4 hour compliance. He was encouraged to continue using CPAP nightly and for greater than 4 hours each night. We will update supply orders as indicated. Risks of untreated sleep apnea review and education materials provided. He will continue levetiracetam 1000mg  daily and gabapentin as needed. Healthy lifestyle habits encouraged. He will follow up in 1 year , sooner if needed. He verbalizes understanding and agreement with this plan.    Orders Placed This Encounter  Procedures  . For home use only DME continuous positive airway pressure (CPAP)    Supplies    Order Specific Question:   Length of Need    Answer:   Lifetime    Order Specific Question:   Patient  has OSA or probable OSA    Answer:   Yes    Order Specific Question:   Is the patient currently using CPAP in the home    Answer:   Yes    Order Specific Question:   Settings    Answer:   Other see comments    Order Specific Question:   CPAP supplies needed    Answer:   Mask, headgear, cushions, filters, heated tubing and water chamber     Meds ordered this encounter  Medications  . gabapentin (NEURONTIN) 300 MG capsule    Sig: Take 1 capsule (300 mg total) by mouth at  bedtime.    Dispense:  90 capsule    Refill:  3    Order Specific Question:   Supervising Provider    Answer:   Melvenia Beam [id number]  . levETIRAcetam (KEPPRA XR) 500 MG 24 hr tablet    Sig: Take 2 tablets (1,000 mg total) by mouth at bedtime.    Dispense:  180 tablet    Refill:  3    Order Specific Question:   Supervising Provider    Answer:   Melvenia Beam [id number]      I spent 15 minutes with the patient. 50% of this time was spent counseling and educating patient on plan of care and medications.    Debbora Presto, FNP-C 07/19/2020, 11:55 AM [facility] [address], [place], [place] [address] [phone]

## 2020-07-19 NOTE — Patient Instructions (Addendum)
Please continue using your CPAP regularly. While your insurance requires that you use CPAP at least 4 hours each night on 70% of the nights, I recommend, that you not skip any nights and use it throughout the night if you can. Getting used to CPAP and staying with the treatment long term does take time and patience and discipline. Untreated obstructive sleep apnea when it is moderate to severe can have an adverse impact on cardiovascular health and raise her risk for heart disease, arrhythmias, hypertension, congestive heart failure, stroke and diabetes. Untreated obstructive sleep apnea causes sleep disruption, nonrestorative sleep, and sleep deprivation. This can have an impact on your day to day functioning and cause daytime sleepiness and impairment of cognitive function, memory loss, mood disturbance, and problems focussing. Using CPAP regularly can improve these symptoms.  Continue levetiracetam Xr 1000mg  daily and gabapentin as needed.   Follow up in 1 year    Sleep Apnea Sleep apnea affects breathing during sleep. It causes breathing to stop for a short time or to become shallow. It can also increase the risk of:  Heart attack.  Stroke.  Being very overweight (obese).  Diabetes.  Heart failure.  Irregular heartbeat. The goal of treatment is to help you breathe normally again. What are the causes? There are three kinds of sleep apnea:  Obstructive sleep apnea. This is caused by a blocked or collapsed airway.  Central sleep apnea. This happens when the brain does not send the right signals to the muscles that control breathing.  Mixed sleep apnea. This is a combination of obstructive and central sleep apnea. The most common cause of this condition is a collapsed or blocked airway. This can happen if:  Your throat muscles are too relaxed.  Your tongue and tonsils are too large.  You are overweight.  Your airway is too small. What increases the risk?  Being  overweight.  Smoking.  Having a small airway.  Being older.  Being male.  Drinking alcohol.  Taking medicines to calm yourself (sedatives or tranquilizers).  Having family members with the condition. What are the signs or symptoms?  Trouble staying asleep.  Being sleepy or tired during the day.  Getting angry a lot.  Loud snoring.  Headaches in the morning.  Not being able to focus your mind (concentrate).  Forgetting things.  Less interest in sex.  Mood swings.  Personality changes.  Feelings of sadness (depression).  Waking up a lot during the night to pee (urinate).  Dry mouth.  Sore throat. How is this diagnosed?  Your medical history.  A physical exam.  A test that is done when you are sleeping (sleep study). The test is most often done in a sleep lab but may also be done at home. How is this treated?   Sleeping on your side.  Using a medicine to get rid of mucus in your nose (decongestant).  Avoiding the use of alcohol, medicines to help you relax, or certain pain medicines (narcotics).  Losing weight, if needed.  Changing your diet.  Not smoking.  Using a machine to open your airway while you sleep, such as: ? An oral appliance. This is a mouthpiece that shifts your lower jaw forward. ? A CPAP device. This device blows air through a mask when you breathe out (exhale). ? An EPAP device. This has valves that you put in each nostril. ? A BPAP device. This device blows air through a mask when you breathe in (inhale) and breathe out.  Having surgery if other treatments do not work. It is important to get treatment for sleep apnea. Without treatment, it can lead to:  High blood pressure.  Coronary artery disease.  In men, not being able to have an erection (impotence).  Reduced thinking ability. Follow these instructions at home: Lifestyle  Make changes that your doctor recommends.  Eat a healthy diet.  Lose weight if  needed.  Avoid alcohol, medicines to help you relax, and some pain medicines.  Do not use any products that contain nicotine or tobacco, such as cigarettes, e-cigarettes, and chewing tobacco. If you need help quitting, ask your doctor. General instructions  Take over-the-counter and prescription medicines only as told by your doctor.  If you were given a machine to use while you sleep, use it only as told by your doctor.  If you are having surgery, make sure to tell your doctor you have sleep apnea. You may need to bring your device with you.  Keep all follow-up visits as told by your doctor. This is important. Contact a doctor if:  The machine that you were given to use during sleep bothers you or does not seem to be working.  You do not get better.  You get worse. Get help right away if:  Your chest hurts.  You have trouble breathing in enough air.  You have an uncomfortable feeling in your back, arms, or stomach.  You have trouble talking.  One side of your body feels weak.  A part of your face is hanging down. These symptoms may be an emergency. Do not wait to see if the symptoms will go away. Get medical help right away. Call your local emergency services (911 in the [place] Do not drive yourself to the hospital. Summary  This condition affects breathing during sleep.  The most common cause is a collapsed or blocked airway.  The goal of treatment is to help you breathe normally while you sleep. This information is not intended to replace advice given to you by your health care provider. Make sure you discuss any questions you have with your health care provider. Document Revised: 06/13/2018 Document Reviewed: 04/22/2018 Elsevier Patient Education  [employer].   Seizure, Adult A seizure is a sudden burst of abnormal electrical activity in the brain. Seizures usually last from 30 seconds to 2 minutes. They can cause many different symptoms. Usually, seizures  are not harmful unless they last a long time. What are the causes? Common causes of this condition include:  Fever or infection.  Conditions that affect the brain, such as: ? A brain abnormality that you were born with. ? A brain or head injury. ? Bleeding in the brain. ? A tumor. ? Stroke. ? Brain disorders such as autism or cerebral palsy.  Low blood sugar.  Conditions that are passed from parent to child (are inherited).  Problems with substances, such as: ? Having a reaction to a drug or a medicine. ? Suddenly stopping the use of a substance (withdrawal). In some cases, the cause may not be known. A person who has repeated seizures over time without a clear cause has a condition called epilepsy. What increases the risk? You are more likely to get this condition if you have:  A family history of epilepsy.  Had a seizure in the past.  A brain disorder.  A history of head injury, lack of oxygen at birth, or strokes. What are the signs or symptoms? There are many types of  seizures. The symptoms vary depending on the type of seizure you have. Examples of symptoms during a seizure include:  Shaking (convulsions).  Stiffness in the body.  Passing out (losing consciousness).  Head nodding.  Staring.  Not responding to sound or touch.  Loss of bladder control and bowel control. Some people have symptoms right before and right after a seizure happens. Symptoms before a seizure may include:  Fear.  Worry (anxiety).  Feeling like you may vomit (nauseous).  Feeling like the room is spinning (vertigo).  Feeling like you saw or heard something before (dj vu).  Odd tastes or smells.  Changes in how you see. You may see flashing lights or spots. Symptoms after a seizure happens can include:  Confusion.  Sleepiness.  Headache.  Weakness on one side of the body. How is this treated? Most seizures will stop on their own in under 5 minutes. In these cases,  no treatment is needed. Seizures that last longer than 5 minutes will usually need treatment. Treatment can include:  Medicines given through an IV tube.  Avoiding things that are known to cause your seizures. These can include medicines that you take for another condition.  Medicines to treat epilepsy.  Surgery to stop the seizures. This may be needed if medicines do not help. Follow these instructions at home: Medicines  Take over-the-counter and prescription medicines only as told by your doctor.  Do not eat or drink anything that may keep your medicine from working, such as alcohol. Activity  Do not do any activities that would be dangerous if you had another seizure, like driving or swimming. Wait until your doctor says it is safe for you to do them.  If you live in the [place] ask your local DMV (department of motor vehicles) when you can drive.  Get plenty of rest. Teaching others Teach friends and family what to do when you have a seizure. They should:  Lay you on the ground.  Protect your head and body.  Loosen any tight clothing around your neck.  Turn you on your side.  Not hold you down.  Not put anything into your mouth.  Know whether or not you need emergency care.  Stay with you until you are better.  General instructions  Contact your doctor each time you have a seizure.  Avoid anything that gives you seizures.  Keep a seizure diary. Write down: ? What you think caused each seizure. ? What you remember about each seizure.  Keep all follow-up visits as told by your doctor. This is important. Contact a doctor if:  You have another seizure.  You have seizures more often.  There is any change in what happens during your seizures.  You keep having seizures with treatment.  You have symptoms of being sick or having an infection. Get help right away if:  You have a seizure that: ? Lasts longer than 5 minutes. ? Is different than seizures you  had before. ? Makes it harder to breathe. ? Happens after you hurt your head.  You have any of these symptoms after a seizure: ? Not being able to speak. ? Not being able to use a part of your body. ? Confusion. ? A bad headache.  You have two or more seizures in a row.  You do not wake up right after a seizure.  You get hurt during a seizure. These symptoms may be an emergency. Do not wait to see if the symptoms will go  away. Get medical help right away. Call your local emergency services (911 in the [place] Do not drive yourself to the hospital. Summary  Seizures usually last from 30 seconds to 2 minutes. Usually, they are not harmful unless they last a long time.  Do not eat or drink anything that may keep your medicine from working, such as alcohol.  Teach friends and family what to do when you have a seizure.  Contact your doctor each time you have a seizure. This information is not intended to replace advice given to you by your health care provider. Make sure you discuss any questions you have with your health care provider. Document Revised: 11/14/2018 Document Reviewed: 11/14/2018 Elsevier Patient Education  [place].

## 2020-08-01 NOTE — Progress Notes (Signed)
I have reviewed and agreed above plan. 

## 2020-11-04 ENCOUNTER — Other Ambulatory Visit: Payer: Self-pay | Admitting: Specialist

## 2020-11-04 DIAGNOSIS — M4722 Other spondylosis with radiculopathy, cervical region: Secondary | ICD-10-CM

## 2020-11-04 DIAGNOSIS — R202 Paresthesia of skin: Secondary | ICD-10-CM

## 2020-11-04 DIAGNOSIS — M48062 Spinal stenosis, lumbar region with neurogenic claudication: Secondary | ICD-10-CM

## 2020-11-04 DIAGNOSIS — R200 Anesthesia of skin: Secondary | ICD-10-CM

## 2020-11-30 ENCOUNTER — Encounter: Payer: Self-pay | Admitting: Specialist

## 2020-11-30 ENCOUNTER — Other Ambulatory Visit: Payer: Self-pay

## 2020-11-30 ENCOUNTER — Ambulatory Visit (INDEPENDENT_AMBULATORY_CARE_PROVIDER_SITE_OTHER): Payer: Medicaid Other | Admitting: Specialist

## 2020-11-30 VITALS — BP 133/87 | HR 75 | Ht 68.0 in | Wt 189.0 lb

## 2020-11-30 DIAGNOSIS — M4722 Other spondylosis with radiculopathy, cervical region: Secondary | ICD-10-CM | POA: Diagnosis not present

## 2020-11-30 DIAGNOSIS — M48062 Spinal stenosis, lumbar region with neurogenic claudication: Secondary | ICD-10-CM

## 2020-11-30 DIAGNOSIS — M7542 Impingement syndrome of left shoulder: Secondary | ICD-10-CM | POA: Diagnosis not present

## 2020-11-30 DIAGNOSIS — R200 Anesthesia of skin: Secondary | ICD-10-CM | POA: Diagnosis not present

## 2020-11-30 DIAGNOSIS — M48061 Spinal stenosis, lumbar region without neurogenic claudication: Secondary | ICD-10-CM

## 2020-11-30 DIAGNOSIS — R202 Paresthesia of skin: Secondary | ICD-10-CM

## 2020-11-30 MED ORDER — TRAMADOL HCL 50 MG PO TABS: ORAL_TABLET | ORAL | 0 refills | Status: DC

## 2020-11-30 NOTE — Patient Instructions (Addendum)
Avoid overhead lifting and overhead use of the arms. Do not lift greater than 5 lbs. Adjust head rest in vehicle to prevent hyperextension if rear ended. Take extra precautions to avoid falling. Avoid bending, stooping and avoid lifting weights greater than 10 lbs. Avoid prolong standing and walking. Avoid frequent bending and stooping  No lifting greater than 10 lbs. May use ice or moist heat for pain. Weight loss is of benefit. Tramadol intermittantly. Take occasional arthritis meds motrin or naproxn.

## 2020-11-30 NOTE — Progress Notes (Signed)
Office Visit Note   Patient: Francis Jackson           Date of Birth: 1966-07-19           MRN: [id number] Visit Date: 11/30/2020              Requested by: Nolene Ebbs, MD [address] [place],  [place] [address] PCP: Nolene Ebbs, MD   Assessment & Plan: Visit Diagnoses:  1. Spinal stenosis of lumbar region with neurogenic claudication   2. Other spondylosis with radiculopathy, cervical region   3. Numbness and tingling of right arm   4. Impingement syndrome of left shoulder   5. Spinal stenosis of lumbar region without neurogenic claudication     Plan: Avoid overhead lifting and overhead use of the arms. Do not lift greater than 5 lbs. Adjust head rest in vehicle to prevent hyperextension if rear ended. Take extra precautions to avoid falling. Avoid bending, stooping and avoid lifting weights greater than 10 lbs. Avoid prolong standing and walking. Avoid frequent bending and stooping  No lifting greater than 10 lbs. May use ice or moist heat for pain. Weight loss is of benefit. Tramadol intermittantly. Take occasional arthritis meds motrin or naproxn.  Follow-Up Instructions: Return in about 6 months (around 06/02/2021).   Orders:  No orders of the defined types were placed in this encounter.  Meds ordered this encounter  Medications  . traMADol (ULTRAM) 50 MG tablet    Sig: TAKE 2 TABLETS BY MOUTH EVERY 6 (SIX) HOURS AS NEEDED FOR UP TO 7 DAYS FOR MODERATE PAIN.    Dispense:  40 tablet    Refill:  0    Not to exceed 5 additional fills before 11/30/2020 DX Code Needed  .      Procedures: No procedures performed   Clinical Data: No additional findings.   Subjective: Chief Complaint  Patient presents with  . Neck - Follow-up  . Lower Back - Follow-up    55 year old male with history of cervical spondylosis and DDD and lumbago and lumbar spondylosis and DDD. He is not any worse, he is able to walk one mile if necessary, he has no difficulty with  bowel or bladder. Continues working now for [employer] He takes ultram intermittantly, sometimes not necessary daily.    Review of Systems  Constitutional: Negative.   HENT: Negative.   Eyes: Negative.   Respiratory: Negative.   Cardiovascular: Negative.   Gastrointestinal: Negative.   Endocrine: Negative.   Genitourinary: Negative.   Musculoskeletal: Negative.   Skin: Negative.   Allergic/Immunologic: Negative.   Neurological: Negative.   Hematological: Negative.   Psychiatric/Behavioral: Negative.      Objective: Vital Signs: BP 133/87 (BP Location: Left Arm, Patient Position: Sitting)   Pulse 75   Ht 5\' 8"  (1.727 m)   Wt 189 lb (85.7 kg)   BMI 28.74 kg/m   Physical Exam Constitutional:      Appearance: He is well-developed.  HENT:     Head: Normocephalic and atraumatic.  Eyes:     Pupils: Pupils are equal, round, and reactive to light.  Pulmonary:     Effort: Pulmonary effort is normal.     Breath sounds: Normal breath sounds.  Abdominal:     General: Bowel sounds are normal.     Palpations: Abdomen is soft.  Musculoskeletal:     Cervical back: Normal range of motion and neck supple.     Lumbar back: Negative right straight leg raise test  and negative left straight leg raise test.  Skin:    General: Skin is warm and dry.  Neurological:     Mental Status: He is alert and oriented to person, place, and time.  Psychiatric:        Behavior: Behavior normal.        Thought Content: Thought content normal.        Judgment: Judgment normal.     Back Exam   Tenderness  The patient is experiencing tenderness in the lumbar and cervical.  Range of Motion  Extension: abnormal  Flexion: normal  Lateral bend right: abnormal  Lateral bend left: abnormal  Rotation right: abnormal  Rotation left: abnormal   Muscle Strength  Right Quadriceps:  5/5  Left Quadriceps:  5/5  Right Hamstrings:  5/5  Left Hamstrings:  5/5   Tests  Straight leg raise right:  negative Straight leg raise left: negative  Reflexes  Patellar: 0/4 Achilles: 0/4 Biceps: 0/4  Comments:  No motor deficit.      Specialty Comments:  No specialty comments available.  Imaging: No results found.   PMFS History: Patient Active Problem List   Diagnosis Date Noted  . Bilateral inguinal hernia (BIH) s/p lap repair w mesh 10/18/2016 10/18/2016  . OSA on CPAP 05/07/2014  . Seizures ([place]) 11/03/2013  . BPH with obstruction/lower urinary tract symptoms 08/24/2013   Past Medical History:  Diagnosis Date  . Acute urinary retention 12/22/2011  . BPH (benign prostatic hyperplasia) 08/24/2013  . Gross hematuria 12/22/2011  . History of kidney stones   . History of urinary retention    12/2011  . Migraine   . Mild obstructive sleep apnea    PER STUDY 09-14-2009-uses CPAP  . Seizure disorder, grand mal ([place])    PER PT LAST SEIZURE 01/2013  . Seizures ([place])   . Ureteral calculus 12/22/2011    History reviewed. No pertinent family history.  Past Surgical History:  Procedure Laterality Date  . CYSTO/  RIGHT RETROGRADE PYELOGRAM/  URETEROSCOPY  12-23-2011  . EXCISION LIPOMA OF SCALP AND FACE  03-24-2005  . INGUINAL HERNIA REPAIR Bilateral 10/18/2016   Procedure: LAPAROSCOPIC EXPLORATION AND REPAIR OF BILATERAL INGUINAL HERNIA WITH A TAPP REPAIR;  Surgeon: Michael Boston, MD;  Location: [facility];  Service: General;  Laterality: Bilateral;  . INSERTION OF MESH Bilateral 10/18/2016   Procedure: INSERTION OF MESH;  Surgeon: Michael Boston, MD;  Location: [facility];  Service: General;  Laterality: Bilateral;  . LAPAROSCOPIC LYSIS OF ADHESIONS  10/18/2016   Procedure: LAPAROSCOPIC LYSIS OF ADHESIONS;  Surgeon: Michael Boston, MD;  Location: [facility];  Service: General;;  . SHOULDER SURGERY Left 2009  . TRANSURETHRAL RESECTION OF PROSTATE N/A 08/24/2013   Procedure: TRANSURETHRAL RESECTION OF THE PROSTATE WITH GYRUS INSTRUMENTS;  Surgeon: Bernestine Amass, MD;  Location: [facility];  Service: Urology;  Laterality: N/A;   Social History   Occupational History  . Occupation: Architect  Tobacco Use  . Smoking status: Never Smoker  . Smokeless tobacco: Never Used  Substance and Sexual Activity  . Alcohol use: No    Alcohol/week: 0.0 standard drinks  . Drug use: No  . Sexual activity: Not on file

## 2021-02-14 ENCOUNTER — Telehealth: Payer: Self-pay | Admitting: Specialist

## 2021-02-14 ENCOUNTER — Other Ambulatory Visit: Payer: Self-pay

## 2021-02-14 ENCOUNTER — Emergency Department (HOSPITAL_COMMUNITY)
Admission: EM | Admit: 2021-02-14 | Discharge: 2021-02-15 | Disposition: A | Discharge status: Home / Self Care | Payer: Medicaid Other | Attending: Emergency Medicine | Admitting: Emergency Medicine

## 2021-02-14 ENCOUNTER — Encounter (HOSPITAL_COMMUNITY): Payer: Self-pay

## 2021-02-14 ENCOUNTER — Emergency Department (HOSPITAL_COMMUNITY): Payer: Medicaid Other

## 2021-02-14 DIAGNOSIS — M7918 Myalgia, other site: Secondary | ICD-10-CM

## 2021-02-14 DIAGNOSIS — M25512 Pain in left shoulder: Secondary | ICD-10-CM | POA: Insufficient documentation

## 2021-02-14 DIAGNOSIS — R079 Chest pain, unspecified: Secondary | ICD-10-CM | POA: Insufficient documentation

## 2021-02-14 DIAGNOSIS — Y92410 Unspecified street and highway as the place of occurrence of the external cause: Secondary | ICD-10-CM | POA: Insufficient documentation
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
## 2021-02-14 NOTE — Telephone Encounter (Signed)
Pt called still experiencing back pain and wanted to discuss coming in for an appt with Dr. Louanne Skye to receive injections. Dr. Louanne Skye is booked out and pt will be leaving to travel out of the country from 6/21 until 7/16 and wanted to see if anything could be done or he could be worked in to help with the pain prior to traveling so "he is not in pain the entire month of travel". The best call back number is [phone].
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
## 2021-02-14 NOTE — Telephone Encounter (Signed)
Please call patient and have him come in tomorrow and see Benjiman Core, Dr. Otho Ket PA-C

## 2021-02-15 ENCOUNTER — Ambulatory Visit (INDEPENDENT_AMBULATORY_CARE_PROVIDER_SITE_OTHER): Payer: Medicaid Other | Admitting: Surgery

## 2021-02-15 ENCOUNTER — Other Ambulatory Visit: Payer: Self-pay

## 2021-02-15 ENCOUNTER — Ambulatory Visit: Payer: Self-pay

## 2021-02-15 ENCOUNTER — Encounter: Payer: Self-pay | Admitting: Surgery

## 2021-02-15 VITALS — BP 133/83 | HR 57 | Ht 68.0 in | Wt 184.0 lb

## 2021-02-15 DIAGNOSIS — M48062 Spinal stenosis, lumbar region with neurogenic claudication: Secondary | ICD-10-CM | POA: Diagnosis not present

## 2021-02-15 MED ORDER — CYCLOBENZAPRINE HCL 10 MG PO TABS
10.0000 mg | ORAL_TABLET | Freq: Once | ORAL | Status: AC
  Administered 2021-02-15: 10 mg via ORAL
  Filled 2021-02-15: qty 1

## 2021-02-15 MED ORDER — HYDROCODONE-ACETAMINOPHEN 5-325 MG PO TABS
1.0000 | ORAL_TABLET | Freq: Once | ORAL | Status: AC
  Administered 2021-02-15: 1 via ORAL
  Filled 2021-02-15: qty 1

## 2021-02-15 MED ORDER — METHYLPREDNISOLONE 4 MG PO TABS: ORAL_TABLET | ORAL | 0 refills | Status: DC

## 2021-02-15 MED ORDER — CYCLOBENZAPRINE HCL 10 MG PO TABS
10.0000 mg | ORAL_TABLET | Freq: Two times a day (BID) | ORAL | 0 refills | Status: AC | PRN
Start: 2021-02-15 — End: ?

## 2021-02-15 MED ORDER — HYDROCODONE-ACETAMINOPHEN 5-325 MG PO TABS: 1.0000 | ORAL_TABLET | ORAL | 0 refills | Status: DC | PRN
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
## 2021-02-15 NOTE — Discharge Instructions (Addendum)
Follow up your doctor if pain lasts longer than 3-4 days without getting better. Return to the emergency department with any severe pain or new concern.  Take medications as prescribed. Continue taking your Naprosyn daily as well.

## 2021-02-18 ENCOUNTER — Ambulatory Visit
Admission: RE | Admit: 2021-02-18 | Discharge: 2021-02-18 | Disposition: A | Discharge status: Home / Self Care | Payer: Medicaid Other | Source: Ambulatory Visit | Attending: Surgery | Admitting: Surgery

## 2021-02-18 ENCOUNTER — Other Ambulatory Visit: Payer: Self-pay

## 2021-02-18 DIAGNOSIS — M48062 Spinal stenosis, lumbar region with neurogenic claudication: Secondary | ICD-10-CM

## 2021-03-24 ENCOUNTER — Other Ambulatory Visit: Payer: Self-pay | Admitting: Specialist

## 2021-03-24 DIAGNOSIS — R200 Anesthesia of skin: Secondary | ICD-10-CM

## 2021-03-24 DIAGNOSIS — M48062 Spinal stenosis, lumbar region with neurogenic claudication: Secondary | ICD-10-CM

## 2021-03-24 DIAGNOSIS — M4722 Other spondylosis with radiculopathy, cervical region: Secondary | ICD-10-CM

## 2021-03-24 DIAGNOSIS — R202 Paresthesia of skin: Secondary | ICD-10-CM

## 2021-06-02 ENCOUNTER — Other Ambulatory Visit: Payer: Self-pay

## 2021-06-02 ENCOUNTER — Encounter: Payer: Self-pay | Admitting: Specialist

## 2021-06-02 ENCOUNTER — Ambulatory Visit: Payer: Medicaid Other | Admitting: Specialist

## 2021-06-02 VITALS — BP 166/93 | HR 52 | Ht 68.0 in | Wt 184.0 lb

## 2021-06-02 DIAGNOSIS — M47816 Spondylosis without myelopathy or radiculopathy, lumbar region: Secondary | ICD-10-CM

## 2021-06-02 DIAGNOSIS — R200 Anesthesia of skin: Secondary | ICD-10-CM

## 2021-06-02 DIAGNOSIS — M48062 Spinal stenosis, lumbar region with neurogenic claudication: Secondary | ICD-10-CM

## 2021-06-02 DIAGNOSIS — R202 Paresthesia of skin: Secondary | ICD-10-CM

## 2021-06-02 DIAGNOSIS — M4722 Other spondylosis with radiculopathy, cervical region: Secondary | ICD-10-CM

## 2021-06-02 DIAGNOSIS — M48061 Spinal stenosis, lumbar region without neurogenic claudication: Secondary | ICD-10-CM | POA: Diagnosis not present

## 2021-06-02 MED ORDER — TRAMADOL HCL 50 MG PO TABS: ORAL_TABLET | ORAL | 0 refills | Status: DC

## 2021-06-02 NOTE — Addendum Note (Signed)
Addended by: Basil Dess on: 06/02/2021 04:43 PM   Modules accepted: Orders

## 2021-06-02 NOTE — Patient Instructions (Signed)
Plan: Avoid bending, stooping and avoid lifting weights greater than 10 lbs. Avoid prolong standing and walking. Avoid frequent bending and stooping  No lifting greater than 10 lbs. May use ice or moist heat for pain. Weight loss is of benefit. Handicap license is approved. Dr. Romona Curls secretary/Assistant will call to arrange for epidural steroid injection   Tramadol for discomfort. Presently he is walking well and there is no focal deficit, will treat conservatively.

## 2021-06-02 NOTE — Progress Notes (Signed)
Office Visit Note   Patient: Francis Jackson           Date of Birth: 08-28-1966           MRN: [id number] Visit Date: 06/02/2021              Requested by: Nolene Ebbs, MD [address] [place],  [place] [address] PCP: Nolene Ebbs, MD   Assessment & Plan: Visit Diagnoses:  1. Spondylosis without myelopathy or radiculopathy, lumbar region   2. Spinal stenosis of lumbar region without neurogenic claudication     Plan: Avoid bending, stooping and avoid lifting weights greater than 10 lbs. Avoid prolong standing and walking. Avoid frequent bending and stooping  No lifting greater than 10 lbs. May use ice or moist heat for pain. Weight loss is of benefit. Handicap license is approved. Dr. Romona Curls secretary/Assistant will call to arrange for epidural steroid injection   Tramadol for discomfort. Presently he is walking well and there is no focal deficit, will treat conservatively.   Follow-Up Instructions: No follow-ups on file.   Orders:  Orders Placed This Encounter  Procedures   Ambulatory referral to Physical Medicine Rehab   No orders of the defined types were placed in this encounter.     Procedures: No procedures performed   Clinical Data: Findings:  Narrative & Impression CLINICAL DATA:  Three chronic low back pain.  No prior surgery.   EXAM: MRI LUMBAR SPINE WITHOUT CONTRAST   TECHNIQUE: Multiplanar, multisequence MR imaging of the lumbar spine was performed. No intravenous contrast was administered.   COMPARISON:  Lumbar spine x-rays dated February 15, 2021. MRI lumbar spine dated Jan 20, 2016.   FINDINGS: Segmentation:  Standard.   Alignment:  Unchanged trace retrolisthesis at L2-L3 and L3-L4.   Vertebrae:  No fracture, evidence of discitis, or bone lesion.   Conus medullaris and cauda equina: Conus extends to the L2 level. Conus and cauda equina appear normal.   Paraspinal and other soft tissues: Negative.   Disc levels:   T12-L1:   Unchanged mild disc bulging.  No stenosis.   L1-L2:  Unchanged mild disc bulging.  No stenosis.   L2-L3: Progressive circumferential disc osteophyte complex with worsened mild to moderate spinal canal stenosis. No neuroforaminal stenosis.   L3-L4: Mildly progressive circumferential disc osteophyte complex and mild bilateral facet arthropathy with ligamentum flavum hypertrophy. Mildly progressive moderate spinal canal stenosis and mild bilateral neuroforaminal stenosis.   L4-L5: Mildly progressive circumferential disc osteophyte complex and endplate spurring. Unchanged mild bilateral facet arthropathy. Mildly progressive severe spinal canal stenosis and moderate bilateral neuroforaminal stenosis.   L5-S1: Unchanged moderate circumferential disc osteophyte complex. Unchanged moderate bilateral facet arthropathy with ligamentum flavum hypertrophy. Unchanged mild spinal canal stenosis and moderate to severe bilateral neuroforaminal stenosis.   IMPRESSION: 1. Progressive multilevel degenerative changes of the lumbar spine as described above, worst at L4-L5 where there is severe spinal canal stenosis and moderate bilateral neuroforaminal stenosis. 2. Unchanged moderate to severe bilateral neuroforaminal stenosis at L5-S1.     Electronically Signed   By: Titus Dubin M.D.   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 He is not on disability. No bowel or bladder difficulty. He has questions  about disability and how he can apply for disability. He has pain and L5 disc is painful. He had a injury to his back  Leaning over and moving items on a conveyor belt carring barrels and placing on the belt. He reports injuring his left shoulder and had to have  surgery on the shoulder by Dr. Ronnie Derby and the shoulder is improved but he still has some pain left superior scapula. Reports cramping of the muscles in the right leg and calf. Able to walk a mile when exercising.   Review of Systems  Constitutional: Negative.   HENT: Negative.    Eyes: Negative.   Respiratory: Negative.    Cardiovascular:  Positive for chest pain (some substernal pain that improves with milk and associated with NSAIDs.).  Gastrointestinal: Negative.   Endocrine: Negative.  Negative for cold intolerance, heat intolerance, polydipsia, polyphagia and polyuria.  Genitourinary: Negative.  Negative for difficulty urinating, dysuria, enuresis, flank pain, frequency, genital sores and hematuria.  Musculoskeletal:  Positive for back pain. Negative for arthralgias, gait problem, joint swelling, myalgias, neck pain and neck stiffness.  Skin: Negative.   Neurological: Negative.  Negative for dizziness, tremors, seizures, syncope, facial asymmetry, speech difficulty, weakness, light-headedness, numbness and headaches.  Hematological: Negative.  Negative for adenopathy. Does not bruise/bleed easily.  Psychiatric/Behavioral: Negative.      Objective: Vital Signs: BP (!) 166/93   Pulse (!) 52   Ht 5\' 8"  (1.727 m)   Wt 184 lb (83.5 kg)   BMI 27.98 kg/m   Physical Exam Musculoskeletal:     Lumbar back: Negative right straight leg raise test and negative left straight leg raise test.   Back Exam   Tenderness  The patient is experiencing tenderness in the lumbar.  Range of Motion  Extension:  abnormal  Flexion:  abnormal  Lateral bend right:  abnormal  Lateral bend left:  abnormal  Rotation right:  abnormal  Rotation left:  abnormal   Muscle Strength  Right Quadriceps:  5/5  Left Quadriceps:  5/5  Right Hamstrings:  5/5  Left Hamstrings:  5/5   Tests  Straight leg raise right: negative Straight leg raise left: negative  Reflexes  Patellar:  2/4 Achilles:   2/4 Babinski's sign: normal   Other  Toe walk: normal Heel walk: normal Sensation: decreased Gait: normal     Specialty Comments:  No specialty comments available.  Imaging: No results found.   PMFS History: Patient Active Problem List   Diagnosis Date Noted   Bilateral inguinal hernia (BIH) s/p lap repair w mesh 10/18/2016 10/18/2016   OSA on CPAP 05/07/2014   Seizures ([place]) 11/03/2013   BPH with obstruction/lower urinary tract symptoms 08/24/2013   Past Medical History:  Diagnosis Date   Acute urinary retention 12/22/2011   BPH (benign prostatic hyperplasia) 08/24/2013   Gross hematuria 12/22/2011   History of kidney stones    History of urinary retention    12/2011   Migraine    Mild obstructive sleep apnea    PER STUDY 09-14-2009-uses CPAP   Seizure disorder, grand mal ([place])    PER PT LAST SEIZURE 01/2013   Seizures ([place])    Ureteral calculus 12/22/2011    History reviewed. No pertinent family history.  Past Surgical History:  Procedure Laterality Date   CYSTO/  RIGHT RETROGRADE PYELOGRAM/  URETEROSCOPY  12-23-2011   EXCISION LIPOMA OF SCALP AND FACE  03-24-2005   INGUINAL HERNIA REPAIR Bilateral 10/18/2016   Procedure: LAPAROSCOPIC EXPLORATION AND REPAIR OF BILATERAL INGUINAL  HERNIA WITH A TAPP REPAIR;  Surgeon: Michael Boston, MD;  Location: [facility];  Service: General;  Laterality: Bilateral;   INSERTION OF MESH Bilateral 10/18/2016   Procedure: INSERTION OF MESH;  Surgeon: Michael Boston, MD;  Location: [facility];  Service: General;  Laterality: Bilateral;   LAPAROSCOPIC LYSIS OF ADHESIONS  10/18/2016   Procedure: LAPAROSCOPIC LYSIS OF ADHESIONS;  Surgeon: Michael Boston, MD;  Location: [facility];  Service: General;;   SHOULDER SURGERY Left 2009   TRANSURETHRAL RESECTION OF PROSTATE N/A 08/24/2013   Procedure: TRANSURETHRAL RESECTION OF THE PROSTATE WITH GYRUS INSTRUMENTS;  Surgeon: Bernestine Amass, MD;  Location: [facility];  Service: Urology;  Laterality: N/A;    Social History   Occupational History   Occupation: Architect  Tobacco Use   Smoking status: Never   Smokeless tobacco: Never  Vaping Use   Vaping Use: Never used  Substance and Sexual Activity   Alcohol use: No    Alcohol/week: 0.0 standard drinks   Drug use: No   Sexual activity: Not on file

## 2021-06-21 ENCOUNTER — Telehealth: Payer: Self-pay | Admitting: Physical Medicine and Rehabilitation

## 2021-06-21 NOTE — Telephone Encounter (Signed)
Francis Jackson called stating she sent some forms to [facility] and just wanted to leave a fax number so she could have the clinical info faxed back.   Fax# [phone]

## 2021-07-10 ENCOUNTER — Other Ambulatory Visit: Payer: Self-pay

## 2021-07-10 ENCOUNTER — Ambulatory Visit: Payer: Medicaid Other | Admitting: Physical Medicine and Rehabilitation

## 2021-07-10 ENCOUNTER — Ambulatory Visit: Payer: Self-pay

## 2021-07-10 ENCOUNTER — Encounter: Payer: Self-pay | Admitting: Physical Medicine and Rehabilitation

## 2021-07-10 VITALS — BP 119/77 | HR 54

## 2021-07-10 DIAGNOSIS — M5416 Radiculopathy, lumbar region: Secondary | ICD-10-CM

## 2021-07-10 MED ORDER — METHYLPREDNISOLONE ACETATE 80 MG/ML IJ SUSP
80.0000 mg | Freq: Once | INTRAMUSCULAR | Status: AC
  Administered 2021-07-10: 80 mg

## 2021-07-10 NOTE — Progress Notes (Signed)
Pt state lower back pain. Pt state laying down and tries to get out of bed and bending makes the pain worse. Pt state takes pain meds to help ease his pain.  Numeric Pain Rating Scale and Functional Assessment Average Pain 9   In the last MONTH (on 0-10 scale) has pain interfered with the following?  1. General activity like being  able to carry out your everyday physical activities such as walking, climbing stairs, carrying groceries, or moving a chair?  Rating(10)   +Driver, -BT, -Dye Allergies.

## 2021-07-10 NOTE — Patient Instructions (Signed)

## 2021-07-10 NOTE — Procedures (Signed)
Lumbosacral Transforaminal Epidural Steroid Injection - Sub-Pedicular Approach with Fluoroscopic Guidance  Patient: Francis Jackson      Date of Birth: 09-16-1965 MRN: [id number] PCP: Nolene Ebbs, MD      Visit Date: 07/10/2021   Universal Protocol:    Date/Time: 07/10/2021  Consent Given By: the patient  Position: PRONE  Additional Comments: Vital signs were monitored before and after the procedure. Patient was prepped and draped in the usual sterile fashion. The correct patient, procedure, and site was verified.   Injection Procedure Details:   Procedure diagnoses: Lumbar radiculopathy [M54.16]    Meds Administered:  Meds ordered this encounter  Medications   methylPREDNISolone acetate (DEPO-MEDROL) injection 80 mg    Laterality: Bilateral  Location/Site: L4  Needle:5.0 in., 22 ga.  Short bevel or Quincke spinal needle  Needle Placement: Transforaminal  Findings:    -Comments: Excellent flow of contrast along the nerve, nerve root and into the epidural space.  Procedure Details: After squaring off the end-plates to get a true AP view, the C-arm was positioned so that an oblique view of the foramen as noted above was visualized. The target area is just inferior to the "nose of the scotty dog" or sub pedicular. The soft tissues overlying this structure were infiltrated with 2-3 ml. of 1% Lidocaine without Epinephrine.  The spinal needle was inserted toward the target using a "trajectory" view along the fluoroscope beam.  Under AP and lateral visualization, the needle was advanced so it did not puncture dura and was located close the 6 O'Clock position of the pedical in AP tracterory. Biplanar projections were used to confirm position. Aspiration was confirmed to be negative for CSF and/or blood. A 1-2 ml. volume of Isovue-250 was injected and flow of contrast was noted at each level. Radiographs were obtained for documentation purposes.   After attaining the desired  flow of contrast documented above, a 0.5 to 1.0 ml test dose of 0.25% Marcaine was injected into each respective transforaminal space.  The patient was observed for 90 seconds post injection.  After no sensory deficits were reported, and normal lower extremity motor function was noted,   the above injectate was administered so that equal amounts of the injectate were placed at each foramen (level) into the transforaminal epidural space.   Additional Comments:  The patient tolerated the procedure well Dressing: 2 x 2 sterile gauze and Band-Aid    Post-procedure details: Patient was observed during the procedure. Post-procedure instructions were reviewed.  Patient left the clinic in stable condition.

## 2021-07-10 NOTE — Progress Notes (Signed)
Francis Jackson - 55 y.o. male MRN [id number]  Date of birth: 04/07/1966  Office Visit Note: Visit Date: 07/10/2021 PCP: Nolene Ebbs, MD Referred by: Nolene Ebbs, MD  Subjective: Chief Complaint  Patient presents with   Lower Back - Pain   HPI:  Francis Jackson is a 55 y.o. male who comes in today for planned repeat Bilateral L4-5  Lumbar Transforaminal epidural steroid injection with fluoroscopic guidance.  The patient has failed conservative care including home exercise, medications, time and activity modification.  This injection will be diagnostic and hopefully therapeutic.  Please see requesting physician notes for further details and justification. Patient received more than 50% pain relief from prior injection. MRI reviewed with images and spine model.  MRI reviewed in the note below.   Referring: Dr. Basil Dess   ROS Otherwise per HPI.  Assessment & Plan: Visit Diagnoses:    ICD-10-CM   1. Lumbar radiculopathy  M54.16 XR C-ARM NO REPORT    Epidural Steroid injection    methylPREDNISolone acetate (DEPO-MEDROL) injection 80 mg      Plan: No additional findings.   Meds & Orders:  Meds ordered this encounter  Medications   methylPREDNISolone acetate (DEPO-MEDROL) injection 80 mg    Orders Placed This Encounter  Procedures   XR C-ARM NO REPORT   Epidural Steroid injection    Follow-up: Return for visit to requesting physician as needed.   Procedures: No procedures performed  Lumbosacral Transforaminal Epidural Steroid Injection - Sub-Pedicular Approach with Fluoroscopic Guidance  Patient: Francis Jackson      Date of Birth: [birth year] MRN: [id number] PCP: Nolene Ebbs, MD      Visit Date: 07/10/2021   Universal Protocol:    Date/Time: 07/10/2021  Consent Given By: the patient  Position: PRONE  Additional Comments: Vital signs were monitored before and after the procedure. Patient was prepped and draped in the usual sterile fashion. The correct  patient, procedure, and site was verified.   Injection Procedure Details:   Procedure diagnoses: Lumbar radiculopathy [M54.16]    Meds Administered:  Meds ordered this encounter  Medications   methylPREDNISolone acetate (DEPO-MEDROL) injection 80 mg    Laterality: Bilateral  Location/Site: L4  Needle:5.0 in., 22 ga.  Short bevel or Quincke spinal needle  Needle Placement: Transforaminal  Findings:    -Comments: Excellent flow of contrast along the nerve, nerve root and into the epidural space.  Procedure Details: After squaring off the end-plates to get a true AP view, the C-arm was positioned so that an oblique view of the foramen as noted above was visualized. The target area is just inferior to the "nose of the scotty dog" or sub pedicular. The soft tissues overlying this structure were infiltrated with 2-3 ml. of 1% Lidocaine without Epinephrine.  The spinal needle was inserted toward the target using a "trajectory" view along the fluoroscope beam.  Under AP and lateral visualization, the needle was advanced so it did not puncture dura and was located close the 6 O'Clock position of the pedical in AP tracterory. Biplanar projections were used to confirm position. Aspiration was confirmed to be negative for CSF and/or blood. A 1-2 ml. volume of Isovue-250 was injected and flow of contrast was noted at each level. Radiographs were obtained for documentation purposes.   After attaining the desired flow of contrast documented above, a 0.5 to 1.0 ml test dose of 0.25% Marcaine was injected into each respective transforaminal space.  The patient was observed for 90 seconds  post injection.  After no sensory deficits were reported, and normal lower extremity motor function was noted,   the above injectate was administered so that equal amounts of the injectate were placed at each foramen (level) into the transforaminal epidural space.   Additional Comments:  The patient tolerated the  procedure well Dressing: 2 x 2 sterile gauze and Band-Aid    Post-procedure details: Patient was observed during the procedure. Post-procedure instructions were reviewed.  Patient left the clinic in stable condition.     Clinical History: MRI LUMBAR SPINE WITHOUT CONTRAST   TECHNIQUE: Multiplanar, multisequence MR imaging of the lumbar spine was performed. No intravenous contrast was administered.   COMPARISON:  Lumbar spine x-rays dated February 15, 2021. MRI lumbar spine dated Jan 20, 2016.   FINDINGS: Segmentation:  Standard.   Alignment:  Unchanged trace retrolisthesis at L2-L3 and L3-L4.   Vertebrae:  No fracture, evidence of discitis, or bone lesion.   Conus medullaris and cauda equina: Conus extends to the L2 level. Conus and cauda equina appear normal.   Paraspinal and other soft tissues: Negative.   Disc levels:   T12-L1:  Unchanged mild disc bulging.  No stenosis.   L1-L2:  Unchanged mild disc bulging.  No stenosis.   L2-L3: Progressive circumferential disc osteophyte complex with worsened mild to moderate spinal canal stenosis. No neuroforaminal stenosis.   L3-L4: Mildly progressive circumferential disc osteophyte complex and mild bilateral facet arthropathy with ligamentum flavum hypertrophy. Mildly progressive moderate spinal canal stenosis and mild bilateral neuroforaminal stenosis.   L4-L5: Mildly progressive circumferential disc osteophyte complex and endplate spurring. Unchanged mild bilateral facet arthropathy. Mildly progressive severe spinal canal stenosis and moderate bilateral neuroforaminal stenosis.   L5-S1: Unchanged moderate circumferential disc osteophyte complex. Unchanged moderate bilateral facet arthropathy with ligamentum flavum hypertrophy. Unchanged mild spinal canal stenosis and moderate to severe bilateral neuroforaminal stenosis.   IMPRESSION: 1. Progressive multilevel degenerative changes of the lumbar spine as described  above, worst at L4-L5 where there is severe spinal canal stenosis and moderate bilateral neuroforaminal stenosis. 2. Unchanged moderate to severe bilateral neuroforaminal stenosis at L5-S1.     Electronically Signed   By: Titus Dubin M.D.   On: 02/18/2021 15:54 ------ MRI CERVICAL SPINE WITHOUT CONTRAST 03/01/2017    FINDINGS: Alignment: Straightening of the normal lordosis.  No subluxation.   Vertebrae: Modic type 2 endplate changes at H9-6. No acute fracture. No focal marrow lesion.   Cord: Normal signal and caliber.   Posterior Fossa, vertebral arteries, paraspinal tissues: Visualized posterior fossa is normal. Vertebral artery flow voids are preserved. Normal visualized paraspinal soft tissues.   Disc levels:   C1-C2: Normal.   C2-C3: Normal disc space and facets. No spinal canal or neuroforaminal stenosis.   C3-C4: Small disc bulge with bilateral uncovertebral hypertrophy. No spinal canal stenosis. Severe bilateral neural foraminal stenosis, unchanged.   C4-C5: Right-sided uncovertebral hypertrophy and facet hypertrophy. No spinal canal or neural foraminal stenosis.   C5-C6: Disc osteophyte complex with unchanged severe bilateral foraminal stenosis. No central spinal canal stenosis.   C6-C7: Severe bilateral neural foraminal stenosis due to bilateral uncovertebral hypertrophy. Small disc bulge. No central spinal canal stenosis.   C7-T1: Normal disc space and facets. No spinal canal or neuroforaminal stenosis.   IMPRESSION: 1. Severe bilateral neural foraminal stenosis at C3-4, C5-6 and C6-7, unchanged time of due to combination of uncovertebral hypertrophy and small disc bulges. 2. No spinal canal stenosis.     Objective:  VS:  HT:  WT:   BMI:     BP:119/77  HR:(!) 54bpm  TEMP: ( )  RESP:  Physical Exam Vitals and nursing note reviewed.  Constitutional:      General: He is not in acute distress.    Appearance: Normal appearance. He is not  ill-appearing.  HENT:     Head: Normocephalic and atraumatic.     Right Ear: External ear normal.     Left Ear: External ear normal.     Nose: No congestion.  Eyes:     Extraocular Movements: Extraocular movements intact.  Cardiovascular:     Rate and Rhythm: Normal rate.     Pulses: Normal pulses.  Pulmonary:     Effort: Pulmonary effort is normal. No respiratory distress.  Abdominal:     General: There is no distension.     Palpations: Abdomen is soft.  Musculoskeletal:        General: No tenderness or signs of injury.     Cervical back: Neck supple.     Right lower leg: No edema.     Left lower leg: No edema.     Comments: Patient has good distal strength without clonus.  Skin:    Findings: No erythema or rash.  Neurological:     General: No focal deficit present.     Mental Status: He is alert and oriented to person, place, and time.     Sensory: No sensory deficit.     Motor: No weakness or abnormal muscle tone.     Coordination: Coordination normal.  Psychiatric:        Mood and Affect: Mood normal.        Behavior: Behavior normal.     Imaging: XR C-ARM NO REPORT  Result Date: 07/10/2021 Please see Notes tab for imaging impression.

## 2021-07-17 NOTE — Patient Instructions (Incomplete)
Please continue using your CPAP regularly. While your insurance requires that you use CPAP at least 4 hours each night on 70% of the nights, I recommend, that you not skip any nights and use it throughout the night if you can. Getting used to CPAP and staying with the treatment long term does take time and patience and discipline. Untreated obstructive sleep apnea when it is moderate to severe can have an adverse impact on cardiovascular health and raise her risk for heart disease, arrhythmias, hypertension, congestive heart failure, stroke and diabetes. Untreated obstructive sleep apnea causes sleep disruption, nonrestorative sleep, and sleep deprivation. This can have an impact on your day to day functioning and cause daytime sleepiness and impairment of cognitive function, memory loss, mood disturbance, and problems focussing. Using CPAP regularly can improve these symptoms.   Continue levetiracetam XR 1000mg  daily.

## 2021-07-17 NOTE — Progress Notes (Deleted)
PATIENT: Francis Jackson DOB: 12-09-1965  REASON FOR VISIT: follow up HISTORY FROM: patient  No chief complaint on file.     HISTORY: (copied from my note on 01/12/2020)   HISTORY Francis Jackson is all 55 years old right-handed [place] male, native of [place], return to clinic for followup seizure, last clinical visit was May 2013 with [name]   He began to have seizure since August 05 2009, it was nocturnal seizure, generalized tonic-clonic lasting about 10-15 minutes.   Evaluation showed normal MRI of the brain, EEG, laboratory showed normal CBC, CMP, UA, negative UDS.   Second nocturnal seizure July 2011, third seizure in February 2012, he was visiting his friend with his wife, he began to circling around, confused, and then went to tonic-clonic , he was started on Keppra 500 mg twice a day in 2012,   4 seizure was in June 2013, while he was not compliant with his Keppra, he was taken to emergency room, had multiple abrasions  to his face, and right eye, CAT scan of the brain and neck showed no significant abnormality.   Fifths seizure in May 2014, again happened when he was not compliant with his Keppra. Proceeding by rising sensation, nauseous,   He also complains of symptoms consistent with obstructive sleep apnea, had sleep study in January 2011, at [facility], by Dr. Kimberlee Nearing, there was evidence of mild obstructive sleep apnea, lowest SpO2 was 82%, he was given advise of losing weight, sleep on his side, he continued to have frequent snoring, wife has to shake him to stop snoring, start breathing almost every night   UPDATE December 19 2016: [name] clinical visit was on December 20 2015, as the seizure was in July 10 2014 after he misses his Keppra dose, is now taking Keppra 500 mg twice a day.   He has been compliant with his CPAP machine, which help him sleep better   UPDATE April 11th,  2019CM Mr. Francis Jackson 55 year old male returns for follow-up, with history of seizure  disorder.  Last seizure occurred in 2015.  He is currently on Keppra XR 500 mg 2 tablets at bedtime.  He denies missing any doses.  CPAP compliance dated 11/17/2017- 12/16/2017 shows compliance greater than 4 hours for 60% for 18 days.  Average usage 4 hours 52 minutes.  Set pressure 7 cm.  EPR level 1.  AHI 7.7 ESS 5.  Patient reports he does not feel he is getting enough pressure from his CPAP.  FSS 16.  He returns for reevaluation   Today 04/14/18 Mr. [name] is a 55 year old male with a history of obstructive sleep apnea on CPAP as well as seizures.  His CPAP download indicates that he uses machine 25 out of 90 days for compliance of 83%.  He uses machine greater than 4 hours 51 out of 90 days for compliance of 57%.  On average he uses his machine 5 hours.  His residual AHI is 3.7 on 8 cm of water with EPR of 1.  The patient states that sometimes he does not go to bed till after 1 AM.  Patient repots that he does not use the machine during the day when he naps.  He remains on Keppra.  Denies any seizure events.  He returns today for evaluation.   UPDATE 02/16/19 ALL Francis Jackson is a 55 y.o. male here today for follow up of OSA on CPAP.  He is doing very well with CPAP therapy.  Download report dated 01/13/2019 through 02/11/2019 reveals that he is used his CPAP 29 out of the last 30 days for compliance of 97%.  29 of the days he used his machine for greater than 4 hours for compliance of 97%.  Average usage was 6 hours and 22 minutes.  AHI was 4.7 on 8 cm of water with an EPR of 1.  There is no significant leak.  He states that his machine is older and is requesting a replacement CPAP.  He has reached out to his DME provider who asked for updated follow-up.  Otherwise he is doing very well and notes significant benefit with CPAP therapy.  He also continues with Keppra 1000 mg every night.  He is tolerating medication well.  He denies any seizure activity.   UPDATE 01/12/2020 ALL Francis Jackson is a 55 y.o.  male here today for follow up of OSA on CPAP and seizure. He admits that he has gotten out of the habit of using CPAP. He denies any concerns with therapy. He wishes to resume daily therapy.   Compliance report dated 12/12/2019 through 01/10/2020 reveals that he has used CPAP therapy 9 of the past 30 days for compliance of 30%.  He did use CPAP greater than 4 hours those 9 days for compliance of 30%.  Average use on days used was 5 hours and 13 minutes.  Residual AHI was 4.0 on 8 cm of water.  There was no leak noted.  Review of 90-day compliance report dated 10/13/2019 through 01/10/2020 reveals daily compliance of 60% and 4-hour compliance of 52%.  He continues levetiracetam XR 1,000mg  daily. No recent seizure activity. He is feeling well and without concerns today.   UPDATE 07/19/2020 ALL:    UPDATE 07/17/21 ALL Francis Jackson is a 55 y.o. male here today for follow up for OSA on CPAP and seizures. He continues levetiracetam XR 1000mg  for seizure management and gabapentin 300mg  daily as needed for neck pain. No seizure activity since 2015.   Compliance report dated 06/18/2020-07/17/2020 reveals a use CPAP 27 of the past 30 days for compliance of 90%.  The CPAP greater than 4 hours 14 of the past 30 days for compliance of 47%.  Average usage on days used was 4 hours and 18 minutes.  Residual AHI was 4.5 on a set pressure of 8 cm of water.  There was no significant leak noted. He works as an Mining engineer and does not always get to sleep for 4 hours a night. He does feel sleepy during the daytime but feels CPAP therapy has helped significantly.      REVIEW OF SYSTEMS: Out of a complete 14 system review of symptoms, the patient complains only of the following symptoms,none  and all other reviewed systems are negative.  ESS: 21 FSS: 9  ALLERGIES: No Known Allergies  HOME MEDICATIONS: Outpatient Medications Prior to Visit  Medication Sig Dispense Refill   cyclobenzaprine (FLEXERIL) 10 MG tablet Take 1  tablet (10 mg total) by mouth 2 (two) times daily as needed for muscle spasms. 20 tablet 0   gabapentin (NEURONTIN) 300 MG capsule Take 1 capsule (300 mg total) by mouth at bedtime. 90 capsule 3   HYDROcodone-acetaminophen (NORCO/VICODIN) 5-325 MG tablet Take 1 tablet by mouth every 4 (four) hours as needed. 10 tablet 0   levETIRAcetam (KEPPRA XR) 500 MG 24 hr tablet Take 2 tablets (1,000 mg total) by mouth at bedtime. 180 tablet 3   methylPREDNISolone (MEDROL) 4  MG tablet 6 day taper to be taken as directed 21 tablet 0   metoprolol succinate (TOPROL-XL) 50 MG 24 hr tablet Take 50 mg by mouth daily.  5   neomycin-polymyxin-pramoxine (NEOSPORIN PLUS) 1 % cream Apply topically 2 (two) times daily. 14.2 g 0   traMADol (ULTRAM) 50 MG tablet TAKE 2 TABLETS BY MOUTH EVERY 6 HOURS AS NEEDED FOR UP TO 7 DAYS FOR MODERATE PAIN. 40 tablet 0   No facility-administered medications prior to visit.    PAST MEDICAL HISTORY: Past Medical History:  Diagnosis Date   Acute urinary retention 12/22/2011   BPH (benign prostatic hyperplasia) 08/24/2013   Gross hematuria 12/22/2011   History of kidney stones    History of urinary retention    12/2011   Migraine    Mild obstructive sleep apnea    PER STUDY 09-14-2009-uses CPAP   Seizure disorder, grand mal ([place])    PER PT LAST SEIZURE 01/2013   Seizures ([place])    Ureteral calculus 12/22/2011    PAST SURGICAL HISTORY: Past Surgical History:  Procedure Laterality Date   CYSTO/  RIGHT RETROGRADE PYELOGRAM/  URETEROSCOPY  12-23-2011   EXCISION LIPOMA OF SCALP AND FACE  03-24-2005   INGUINAL HERNIA REPAIR Bilateral 10/18/2016   Procedure: LAPAROSCOPIC EXPLORATION AND REPAIR OF BILATERAL INGUINAL HERNIA WITH A TAPP REPAIR;  Surgeon: Michael Boston, MD;  Location: [facility];  Service: General;  Laterality: Bilateral;   INSERTION OF MESH Bilateral 10/18/2016   Procedure: INSERTION OF MESH;  Surgeon: Michael Boston, MD;  Location: [facility];  Service: General;  Laterality:  Bilateral;   LAPAROSCOPIC LYSIS OF ADHESIONS  10/18/2016   Procedure: LAPAROSCOPIC LYSIS OF ADHESIONS;  Surgeon: Michael Boston, MD;  Location: [facility];  Service: General;;   SHOULDER SURGERY Left 2009   TRANSURETHRAL RESECTION OF PROSTATE N/A 08/24/2013   Procedure: TRANSURETHRAL RESECTION OF THE PROSTATE WITH GYRUS INSTRUMENTS;  Surgeon: Bernestine Amass, MD;  Location: [facility];  Service: Urology;  Laterality: N/A;    FAMILY HISTORY: No family history on file.  SOCIAL HISTORY: Social History   Socioeconomic History   Marital status: Married    Spouse name: Not on file   Number of children: 4   Years of education: 12   Highest education level: Not on file  Occupational History   Occupation: Taxi driver  Tobacco Use   Smoking status: Never   Smokeless tobacco: Never  Vaping Use   Vaping Use: Never used  Substance and Sexual Activity   Alcohol use: No    Alcohol/week: 0.0 standard drinks   Drug use: No   Sexual activity: Not on file  Other Topics Concern   Not on file  Social History Narrative   Patient lives at home with his wife Sueanne Margarita)   Patient works as a Architect part time.   Right handed   Patient drinks one cup of tea per week.   Patient has four children.      Originally from [place] in [place] Strain: Not on file  Food Insecurity: Not on file  Transportation Needs: Not on file  Physical Activity: Not on file  Stress: Not on file  Social Connections: Not on file  Intimate Partner Violence: Not on file     PHYSICAL EXAM  There were no vitals filed for this visit.  There is no height or weight on file to calculate BMI.  Generalized: Well developed,  in no acute distress  Cardiology: normal rate and rhythm, no murmur noted Respiratory: clear to auscultation bilaterally  Neurological examination  Mentation: Alert oriented to time, place, history taking. Follows all commands  speech and language fluent Cranial nerve II-XII: Pupils were equal round reactive to light. Extraocular movements were full, visual field were full  Motor: The motor testing reveals 5 over 5 strength of all 4 extremities. Good symmetric motor tone is noted throughout.  Gait and station: Gait is normal.    DIAGNOSTIC DATA (LABS, IMAGING, TESTING) - I reviewed patient records, labs, notes, testing and imaging myself where available.  No flowsheet data found.   Lab Results  Component Value Date   WBC 3.5 (L) 10/09/2016   HGB 14.5 10/09/2016   HCT 44.7 10/09/2016   MCV 78.0 10/09/2016   PLT 304 10/09/2016      Component Value Date/Time   NA 140 10/09/2016 0947   K 4.4 10/09/2016 0947   CL 106 10/09/2016 0947   CO2 31 10/09/2016 0947   GLUCOSE 91 10/09/2016 0947   BUN 10 10/09/2016 0947   CREATININE 0.96 10/09/2016 0947   CALCIUM 8.7 (L) 10/09/2016 0947   PROT 7.8 02/12/2012 1726   ALBUMIN 4.3 02/12/2012 1726   AST 27 02/12/2012 1726   ALT 24 02/12/2012 1726   ALKPHOS 63 02/12/2012 1726   BILITOT 0.3 02/12/2012 1726   GFRNONAA >60 10/09/2016 0947   GFRAA >60 10/09/2016 0947   No results found for: CHOL, HDL, LDLCALC, LDLDIRECT, TRIG, CHOLHDL No results found for: HGBA1C No results found for: VITAMINB12 No results found for: TSH   ASSESSMENT AND PLAN 55 y.o. year old male  has a past medical history of Acute urinary retention (12/22/2011), BPH (benign prostatic hyperplasia) (08/24/2013), Gross hematuria (12/22/2011), History of kidney stones, History of urinary retention, Migraine, Mild obstructive sleep apnea, Seizure disorder, grand mal ([place]), Seizures ([place]), and Ureteral calculus (12/22/2011). here with   No diagnosis found.      Rockie S Hueber is doing well on CPAP therapy. Daily compliance at goal with sub optimal 4 hour compliance. He was encouraged to continue using CPAP nightly and for greater than 4 hours each night. We will update supply orders as indicated. Risks  of untreated sleep apnea review and education materials provided. He will continue levetiracetam 1000mg  daily and gabapentin as needed. Healthy lifestyle habits encouraged. He will follow up in 1 year , sooner if needed. He verbalizes understanding and agreement with this plan.    No orders of the defined types were placed in this encounter.    No orders of the defined types were placed in this encounter.     Debbora Presto, FNP-C 07/17/2021, 4:25 PM [facility] [address] [place], [place] [address] [phone]

## 2021-07-19 ENCOUNTER — Ambulatory Visit: Payer: Medicaid Other | Admitting: Family Medicine

## 2021-07-19 DIAGNOSIS — G4733 Obstructive sleep apnea (adult) (pediatric): Secondary | ICD-10-CM

## 2021-07-19 DIAGNOSIS — R569 Unspecified convulsions: Secondary | ICD-10-CM

## 2021-07-20 ENCOUNTER — Ambulatory Visit: Payer: Medicaid Other | Admitting: Neurology

## 2021-07-20 ENCOUNTER — Encounter: Payer: Self-pay | Admitting: Neurology

## 2021-07-20 ENCOUNTER — Telehealth: Payer: Self-pay | Admitting: Neurology

## 2021-07-20 VITALS — BP 148/86 | HR 52 | Ht 68.0 in | Wt 192.0 lb

## 2021-07-20 DIAGNOSIS — G4733 Obstructive sleep apnea (adult) (pediatric): Secondary | ICD-10-CM

## 2021-07-20 DIAGNOSIS — R569 Unspecified convulsions: Secondary | ICD-10-CM | POA: Diagnosis not present

## 2021-07-20 DIAGNOSIS — Z9989 Dependence on other enabling machines and devices: Secondary | ICD-10-CM | POA: Diagnosis not present

## 2021-07-20 MED ORDER — LEVETIRACETAM ER 500 MG PO TB24: 1000.0000 mg | ORAL_TABLET | Freq: Every day | ORAL | 3 refills | Status: DC

## 2021-07-20 MED ORDER — LEVETIRACETAM ER 500 MG PO TB24: 1500.0000 mg | ORAL_TABLET | Freq: Every day | ORAL | 3 refills | Status: DC

## 2021-07-20 NOTE — Progress Notes (Addendum)
PATIENT: Francis Jackson DOB: [birth year]  REASON FOR VISIT: follow up HISTORY FROM: patient  Chief Complaint  Patient presents with   Follow-up    Rm 14, alone, reports a small spell last week, while he was at church, states he forgot where he was for 60 sec, today c/o mild headaches      ASSESSMENT AND PLAN   Complex partial seizure  Most recent recurrent was July 16, 2021 while taking Keppra XR 500 mg 2 tablets every night,  Check the level  Increase to Keppra XR 500 mg 3 tablets every night  Call clinic for recurrent seizure  Obstructive sleep apnea  Using CPAP  Follow-up in 6 months  DIAGNOSTIC DATA (LABS, IMAGING, TESTING) - I reviewed patient records, labs, notes, testing and imaging myself where available.   HISTORY:    Francis Jackson is all 55 years old right-handed [place] American male, native of [place], return to clinic for followup seizure, last clinical visit was May 2013 with Francis Jackson   He began to have seizure since August 05 2009, it was nocturnal seizure, generalized tonic-clonic lasting about 10-15 minutes.   Evaluation showed normal MRI of the brain, EEG, laboratory showed normal CBC, CMP, UA, negative UDS.   Second nocturnal seizure July 2011, third seizure in February 2012, he was visiting his friend with his wife, he began to circling around, confused, and then went to tonic-clonic , he was started on Keppra 500 mg twice a day in 2012,   4 seizure was in June 2013, while he was not compliant with his Keppra, he was taken to emergency room, had multiple abrasions  to his face, and right eye, CAT scan of the brain and neck showed no significant abnormality.   Fifths seizure in May 2014, again happened when he was not compliant with his Keppra. Proceeding by rising sensation, nauseous,   He also complains of symptoms consistent with obstructive sleep apnea, had sleep study in January 2011, at [facility], by Dr. Kimberlee Jackson, there was evidence of mild  obstructive sleep apnea, lowest SpO2 was 82%, he was given advise of losing weight, sleep on his side, he continued to have frequent snoring, wife has to shake him to stop snoring, start breathing almost every night  Update July 20, 2021, Over the years, he had occasionally recurrent seizure if not compliant with his Keppra, reported seizure July 10, 2014, has been doing very well, taking Keppra xr 500 mg 2 tablets every night,  CT head without contrast 2016 at [facility] was normal Had a recurrent seizure on July 16, 2021, happened while he was sitting at the pew at church, witnessed body jerking movement, then turning his body in prayer position, paramedic was called, by that time, he came around, did not go to hospital, patient stated he has been compliant with his medications,  He is also followed by our clinic for obstructive sleep apnea, CPAP machine,  PHYSICAL EXAM  Vitals:   07/20/21 1545  BP: (!) 148/86  Pulse: (!) 52  Weight: 192 lb (87.1 kg)  Height: 5\' 8"  (1.727 m)   Body mass index is 29.19 kg/m.   PHYSICAL EXAMNIATION:  Gen: NAD, conversant, well nourised, well groomed                     Cardiovascular: Regular rate rhythm, no peripheral edema, warm, nontender. Eyes: Conjunctivae clear without exudates or hemorrhage Neck: Supple, no carotid bruits. Pulmonary: Clear to auscultation bilaterally  NEUROLOGICAL EXAM:  MENTAL STATUS: Speech/Cognition: Awake, alert, normal speech, oriented to history taking and casual conversation.  CRANIAL NERVES: CN II: Visual fields are full to confrontation.  Pupils are round equal and briskly reactive to light. CN III, IV, VI: extraocular movement are normal. No ptosis. CN V: Facial sensation is intact to light touch. CN VII: Face is symmetric with normal eye closure and smile. CN VIII: Hearing is normal to casual conversation CN IX, X: Palate elevates symmetrically. Phonation is normal. CN XI: Head turning and  shoulder shrug are intact CN XII: Tongue is midline with normal movements and no atrophy.  MOTOR: Muscle bulk and tone are normal. Muscle strength is normal.  REFLEXES: Reflexes are 2  and symmetric at the biceps, triceps, knees and ankles. Plantar responses are flexor.  SENSORY: Intact to light touch, pinprick, positional and vibratory sensation at fingers and toes.  COORDINATION: There is no trunk or limb ataxia.    GAIT/STANCE: Posture is normal. Gait is steady with normal steps, base, arm swing and turning.    REVIEW OF SYSTEMS: Out of a complete 14 system review of symptoms, the patient complains only of the following symptoms,none  and all other reviewed systems are negative.  ESS: 21 FSS: 9  ALLERGIES: No Known Allergies  HOME MEDICATIONS: Outpatient Medications Prior to Visit  Medication Sig Dispense Refill   cyclobenzaprine (FLEXERIL) 10 MG tablet Take 1 tablet (10 mg total) by mouth 2 (two) times daily as needed for muscle spasms. 20 tablet 0   HYDROcodone-acetaminophen (NORCO/VICODIN) 5-325 MG tablet Take 1 tablet by mouth every 4 (four) hours as needed. 10 tablet 0   levETIRAcetam (KEPPRA XR) 500 MG 24 hr tablet Take 2 tablets (1,000 mg total) by mouth at bedtime. 180 tablet 3   methylPREDNISolone (MEDROL) 4 MG tablet 6 day taper to be taken as directed 21 tablet 0   metoprolol succinate (TOPROL-XL) 50 MG 24 hr tablet Take 50 mg by mouth daily.  5   neomycin-polymyxin-pramoxine (NEOSPORIN PLUS) 1 % cream Apply topically 2 (two) times daily. 14.2 g 0   traMADol (ULTRAM) 50 MG tablet TAKE 2 TABLETS BY MOUTH EVERY 6 HOURS AS NEEDED FOR UP TO 7 DAYS FOR MODERATE PAIN. 40 tablet 0   gabapentin (NEURONTIN) 300 MG capsule Take 1 capsule (300 mg total) by mouth at bedtime. 90 capsule 3   No facility-administered medications prior to visit.    PAST MEDICAL HISTORY: Past Medical History:  Diagnosis Date   Acute urinary retention 12/22/2011   BPH (benign prostatic  hyperplasia) 08/24/2013   Gross hematuria 12/22/2011   History of kidney stones    History of urinary retention    12/2011   Migraine    Mild obstructive sleep apnea    PER STUDY 09-14-2009-uses CPAP   Seizure disorder, grand mal ([place])    PER PT LAST SEIZURE 01/2013   Seizures ([place])    Ureteral calculus 12/22/2011    PAST SURGICAL HISTORY: Past Surgical History:  Procedure Laterality Date   CYSTO/  RIGHT RETROGRADE PYELOGRAM/  URETEROSCOPY  12-23-2011   EXCISION LIPOMA OF SCALP AND FACE  03-24-2005   INGUINAL HERNIA REPAIR Bilateral 10/18/2016   Procedure: LAPAROSCOPIC EXPLORATION AND REPAIR OF BILATERAL INGUINAL HERNIA WITH A TAPP REPAIR;  Surgeon: Michael Boston, MD;  [facility];  Service: General;  Laterality: Bilateral;   INSERTION OF MESH Bilateral 10/18/2016   Procedure: INSERTION OF MESH;  Surgeon: Michael Boston, MD;  [facility]: [facility];  Service: General;  Laterality: Bilateral;   LAPAROSCOPIC LYSIS OF ADHESIONS  10/18/2016   Procedure: LAPAROSCOPIC LYSIS OF ADHESIONS;  Surgeon: Michael Boston, MD;  Location[facility];  Service: General;;   SHOULDER SURGERY Left 2009   TRANSURETHRAL RESECTION OF PROSTATE N/A 08/24/2013   Procedure: TRANSURETHRAL RESECTION OF THE PROSTATE WITH GYRUS INSTRUMENTS;  Surgeon: Bernestine Amass, MD;  Location: [facility];  Service: Urology;  Laterality: N/A;    FAMILY HISTORY: History reviewed. No pertinent family history.  SOCIAL HISTORY: Social History   Socioeconomic History   Marital status: Married    Spouse name: Not on file   Number of children: 4   Years of education: 12   Highest education level: Not on file  Occupational History   Occupation: Taxi driver  Tobacco Use   Smoking status: Never   Smokeless tobacco: Never  Vaping Use   Vaping Use: Never used  Substance and Sexual Activity   Alcohol use: No    Alcohol/week: 0.0 standard drinks   Drug use: No   Sexual activity: Not on file  Other Topics Concern   Not on  file  Social History Narrative   Patient lives at home with his wife Sueanne Margarita)   Patient works as a Architect part time.   Right handed   Patient drinks one cup of tea per week.   Patient has four children.      Originally from [place] in [place] Strain: Not on file  Food Insecurity: Not on file  Transportation Needs: Not on file  Physical Activity: Not on file  Stress: Not on file  Social Connections: Not on file  Intimate Partner Violence: Not on file      Marcial Pacas, M.D. Ph.D.  [facility] [place], [place] [address] Phone: [phone] Fax:      [phone]

## 2021-07-20 NOTE — Addendum Note (Signed)
Addended by: Debbora Presto L on: 07/20/2021 04:31 PM   Modules accepted: Orders

## 2021-07-20 NOTE — Telephone Encounter (Signed)
Patient stated that his current facial mask for CPAP machine is too big, leaking air,

## 2021-07-21 LAB — COMPREHENSIVE METABOLIC PANEL
ALT: 21 IU/L (ref 0–44)
AST: 21 IU/L (ref 0–40)
Albumin/Globulin Ratio: 1.6 (ref 1.2–2.2)
Albumin: 4.6 g/dL (ref 3.8–4.9)
Alkaline Phosphatase: 64 IU/L (ref 44–121)
BUN/Creatinine Ratio: 14 (ref 9–20)
BUN: 16 mg/dL (ref 6–24)
Bilirubin Total: 0.4 mg/dL (ref 0.0–1.2)
CO2: 25 mmol/L (ref 20–29)
Calcium: 9.5 mg/dL (ref 8.7–10.2)
Chloride: 102 mmol/L (ref 96–106)
Creatinine, Ser: 1.16 mg/dL (ref 0.76–1.27)
Globulin, Total: 2.8 g/dL (ref 1.5–4.5)
Glucose: 75 mg/dL (ref 70–99)
Potassium: 4.8 mmol/L (ref 3.5–5.2)
Sodium: 141 mmol/L (ref 134–144)
Total Protein: 7.4 g/dL (ref 6.0–8.5)
eGFR: 75 mL/min/{1.73_m2} (ref >59)

## 2021-07-21 LAB — CBC WITH DIFFERENTIAL/PLATELET
Basophils Absolute: 0 10*3/uL (ref 0.0–0.2)
Basos: 1 %
EOS (ABSOLUTE): 0.1 10*3/uL (ref 0.0–0.4)
Eos: 2 %
Hematocrit: 50.9 % (ref 37.5–51.0)
Hemoglobin: 15.8 g/dL (ref 13.0–17.7)
Immature Grans (Abs): 0 10*3/uL (ref 0.0–0.1)
Immature Granulocytes: 1 %
Lymphocytes Absolute: 2.2 10*3/uL (ref 0.7–3.1)
Lymphs: 38 %
MCH: 25 pg — ABNORMAL LOW (ref 26.6–33.0)
MCHC: 31 g/dL — ABNORMAL LOW (ref 31.5–35.7)
MCV: 81 fL (ref 79–97)
Monocytes Absolute: 0.6 10*3/uL (ref 0.1–0.9)
Monocytes: 10 %
Neutrophils Absolute: 2.9 10*3/uL (ref 1.4–7.0)
Neutrophils: 48 %
Platelets: 351 10*3/uL (ref 150–450)
RBC: 6.31 x10E6/uL — ABNORMAL HIGH (ref 4.14–5.80)
RDW: 14.2 % (ref 11.6–15.4)
WBC: 5.8 10*3/uL (ref 3.4–10.8)

## 2021-07-21 LAB — TSH: TSH: 1.4 u[IU]/mL (ref 0.450–4.500)

## 2021-07-21 LAB — LEVETIRACETAM LEVEL: Levetiracetam Lvl: 14.8 ug/mL (ref 10.0–40.0)

## 2021-07-24 ENCOUNTER — Telehealth: Payer: Self-pay | Admitting: Neurology

## 2021-07-24 NOTE — Telephone Encounter (Signed)
Please call patient, laboratory evaluations showed no clinically significant abnormalities.

## 2021-07-24 NOTE — Telephone Encounter (Signed)
CM sent to [facility] 

## 2021-07-24 NOTE — Telephone Encounter (Signed)
Left message with the information below. Provided our call back number in case he has questions.

## 2021-07-24 NOTE — Telephone Encounter (Signed)
Please call patient, laboratory evaluation showed no significant abnormalities.

## 2021-09-01 ENCOUNTER — Ambulatory Visit: Payer: Medicaid Other | Admitting: Specialist

## 2021-09-01 ENCOUNTER — Encounter: Payer: Self-pay | Admitting: Specialist

## 2021-09-01 ENCOUNTER — Other Ambulatory Visit: Payer: Self-pay

## 2021-09-01 VITALS — BP 123/82 | HR 59 | Ht 68.0 in | Wt 192.0 lb

## 2021-09-01 DIAGNOSIS — M4722 Other spondylosis with radiculopathy, cervical region: Secondary | ICD-10-CM | POA: Diagnosis not present

## 2021-09-01 DIAGNOSIS — M47816 Spondylosis without myelopathy or radiculopathy, lumbar region: Secondary | ICD-10-CM

## 2021-09-01 DIAGNOSIS — M48062 Spinal stenosis, lumbar region with neurogenic claudication: Secondary | ICD-10-CM

## 2021-09-01 DIAGNOSIS — M48061 Spinal stenosis, lumbar region without neurogenic claudication: Secondary | ICD-10-CM

## 2021-09-01 DIAGNOSIS — R200 Anesthesia of skin: Secondary | ICD-10-CM

## 2021-09-01 DIAGNOSIS — R202 Paresthesia of skin: Secondary | ICD-10-CM

## 2021-09-01 MED ORDER — TRAMADOL HCL 50 MG PO TABS: ORAL_TABLET | ORAL | 0 refills | Status: DC

## 2021-09-01 NOTE — Addendum Note (Signed)
Addended by: Basil Dess on: 09/01/2021 10:16 AM   Modules accepted: Orders

## 2021-09-01 NOTE — Progress Notes (Signed)
Office Visit Note   Patient: Francis Jackson           Date of Birth: 01/08/1966           MRN: [id number] Visit Date: 09/01/2021              Requested by: Nolene Ebbs, MD [address] [place],  [place] [address] PCP: Nolene Ebbs, MD   Assessment & Plan: Visit Diagnoses:  1. Spinal stenosis of lumbar region without neurogenic claudication   2. Spinal stenosis of lumbar region with neurogenic claudication   3. Spondylosis without myelopathy or radiculopathy, lumbar region   4. Other spondylosis with radiculopathy, cervical region   5. Numbness and tingling of right arm     Plan: Avoid bending, stooping and avoid lifting weights greater than 10 lbs. Avoid prolong standing and walking. Avoid frequent bending and stooping  No lifting greater than 10 lbs. May use ice or moist heat for pain. Weight loss is of benefit. Handicap license is approved. If the pain worsens please call us and we would have Dr. Romona Curls secretary/Assistant will call to arrange for epidural steroid injection    Follow-Up Instructions: No follow-ups on file.   Orders:  No orders of the defined types were placed in this encounter.  No orders of the defined types were placed in this encounter.     Procedures: No procedures performed   Clinical Data: Findings:  Narrative & Impression CLINICAL DATA:  Three chronic low back pain.  No prior surgery.   EXAM: MRI LUMBAR SPINE WITHOUT CONTRAST   TECHNIQUE: Multiplanar, multisequence MR imaging of the lumbar spine was performed. No intravenous contrast was administered.   COMPARISON:  Lumbar spine x-rays dated February 15, 2021. MRI lumbar spine dated Jan 20, 2016.   FINDINGS: Segmentation:  Standard.   Alignment:  Unchanged trace retrolisthesis at L2-L3 and L3-L4.   Vertebrae:  No fracture, evidence of discitis, or bone lesion.   Conus medullaris and cauda equina: Conus extends to the L2 level. Conus and cauda equina appear normal.    Paraspinal and other soft tissues: Negative.   Disc levels:   T12-L1:  Unchanged mild disc bulging.  No stenosis.   L1-L2:  Unchanged mild disc bulging.  No stenosis.   L2-L3: Progressive circumferential disc osteophyte complex with worsened mild to moderate spinal canal stenosis. No neuroforaminal stenosis.   L3-L4: Mildly progressive circumferential disc osteophyte complex and mild bilateral facet arthropathy with ligamentum flavum hypertrophy. Mildly progressive moderate spinal canal stenosis and mild bilateral neuroforaminal stenosis.   L4-L5: Mildly progressive circumferential disc osteophyte complex and endplate spurring. Unchanged mild bilateral facet arthropathy. Mildly progressive severe spinal canal stenosis and moderate bilateral neuroforaminal stenosis.   L5-S1: Unchanged moderate circumferential disc osteophyte complex. Unchanged moderate bilateral facet arthropathy with ligamentum flavum hypertrophy. Unchanged mild spinal canal stenosis and moderate to severe bilateral neuroforaminal stenosis.   IMPRESSION: 1. Progressive multilevel degenerative changes of the lumbar spine as described above, worst at L4-L5 where there is severe spinal canal stenosis and moderate bilateral neuroforaminal stenosis. 2. Unchanged moderate to severe bilateral neuroforaminal stenosis at L5-S1.     Electronically Signed   By: Titus Dubin M.D.   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 Mainly sitting job, he tolerates it well but has difficulty with standing in one place and with transitioning to walking. He is told to exercise. No bowel or bladder difficulty. Had ESI bilateral L4 done 10/31 and he had good relief at least 40% of the pain with one set of injections  bilateral L4.   Review of Systems  Constitutional: Negative.   HENT: Negative.    Eyes: Negative.   Respiratory: Negative.    Cardiovascular: Negative.   Gastrointestinal: Negative.   Endocrine: Negative.   Genitourinary: Negative.   Musculoskeletal: Negative.   Skin: Negative.   Allergic/Immunologic: Negative.   Neurological: Negative.   Hematological: Negative.   Psychiatric/Behavioral: Negative.      Objective: Vital Signs: BP 123/82 (BP Location: Left Arm, Patient Position: Sitting)    Pulse (!) 59    Ht 5\' 8"  (9.983 m)    Wt 192 lb (87.1 kg)    BMI 29.19 kg/m   Physical Exam Constitutional:      Appearance: He is well-developed.  HENT:     Head: Normocephalic and atraumatic.  Eyes:     Pupils: Pupils are equal, round, and reactive to light.  Pulmonary:     Effort: Pulmonary effort is normal.     Breath sounds: Normal breath sounds.  Abdominal:     General: Bowel sounds are normal.     Palpations: Abdomen is soft.  Musculoskeletal:     Cervical back: Normal range of motion and neck supple.  Skin:    General: Skin is warm and dry.  Neurological:     Mental Status: He is alert and oriented to person, place, and time.  Psychiatric:        Behavior: Behavior normal.        Thought Content: Thought content normal.        Judgment: Judgment normal.   Back Exam   Tenderness  The patient is experiencing tenderness in the lumbar.  Range of Motion  Extension:  abnormal  Flexion:  normal  Lateral bend right:  abnormal  Lateral bend left:  abnormal  Rotation right:  abnormal  Rotation left:  abnormal   Muscle Strength  Right Quadriceps:  5/5  Left Quadriceps:  5/5  Right Hamstrings:  5/5  Left Hamstrings:  5/5   Reflexes  Patellar:  0/4 Achilles:  0/4 Biceps:  2/4 Babinski's sign: normal   Other  Scars: absent  Comments:  Stairs due cause pain Can reach his socks    Specialty Comments:  No specialty comments available.  Imaging: No results  found.   PMFS History: Patient Active Problem List   Diagnosis Date Noted   Bilateral inguinal hernia (BIH) s/p lap repair w mesh 10/18/2016 10/18/2016   OSA on CPAP 05/07/2014   Seizures ([place]) 11/03/2013   BPH with obstruction/lower urinary tract symptoms 08/24/2013   Past Medical History:  Diagnosis Date   Acute urinary retention 12/22/2011   BPH (benign prostatic hyperplasia) 08/24/2013   Gross hematuria 12/22/2011   History of kidney stones    History of urinary retention    12/2011   Migraine    Mild obstructive sleep apnea    PER STUDY 09-14-2009-uses CPAP   Seizure disorder, grand mal ([place])    PER PT LAST SEIZURE 01/2013   Seizures ([place])    Ureteral calculus 12/22/2011    History reviewed. No pertinent family history.  Past Surgical History:  Procedure Laterality Date   CYSTO/  RIGHT RETROGRADE PYELOGRAM/  URETEROSCOPY  12-23-2011   EXCISION LIPOMA OF SCALP AND FACE  03-24-2005   INGUINAL HERNIA REPAIR Bilateral 10/18/2016   Procedure: LAPAROSCOPIC EXPLORATION AND REPAIR OF BILATERAL INGUINAL HERNIA WITH A TAPP REPAIR;  Surgeon: Michael Boston, MD;  Location: [facility];  Service: General;  Laterality: Bilateral;   INSERTION OF MESH Bilateral 10/18/2016   Procedure: INSERTION OF MESH;  Surgeon: Michael Boston, MD;  Location: [facility];  Service: General;  Laterality: Bilateral;   LAPAROSCOPIC LYSIS OF ADHESIONS  10/18/2016   Procedure: LAPAROSCOPIC LYSIS OF ADHESIONS;  Surgeon: Michael Boston, MD;  Location: [facility];  Service: General;;   SHOULDER SURGERY Left 2009   TRANSURETHRAL RESECTION OF PROSTATE N/A 08/24/2013   Procedure: TRANSURETHRAL RESECTION OF THE PROSTATE WITH GYRUS INSTRUMENTS;  Surgeon: Bernestine Amass, MD;  Location: [facility];  Service: Urology;  Laterality: N/A;   Social History   Occupational History   Occupation: Architect  Tobacco Use   Smoking status: Never   Smokeless tobacco: Never  Vaping Use   Vaping Use: Never used  Substance and Sexual  Activity   Alcohol use: No    Alcohol/week: 0.0 standard drinks   Drug use: No   Sexual activity: Not on file

## 2021-09-01 NOTE — Patient Instructions (Signed)
Avoid bending, stooping and avoid lifting weights greater than 10 lbs. Avoid prolong standing and walking. Avoid frequent bending and stooping  No lifting greater than 10 lbs. May use ice or moist heat for pain. Weight loss is of benefit. Handicap license is approved. If the pain worsens please call us and we would have Dr. Romona Curls secretary/Assistant will call to arrange for epidural steroid injection

## 2021-11-01 ENCOUNTER — Other Ambulatory Visit: Payer: Self-pay | Admitting: Specialist

## 2021-11-01 DIAGNOSIS — R200 Anesthesia of skin: Secondary | ICD-10-CM

## 2021-11-01 DIAGNOSIS — M4722 Other spondylosis with radiculopathy, cervical region: Secondary | ICD-10-CM

## 2021-11-01 DIAGNOSIS — M48062 Spinal stenosis, lumbar region with neurogenic claudication: Secondary | ICD-10-CM

## 2021-12-18 ENCOUNTER — Other Ambulatory Visit: Payer: Self-pay | Admitting: Specialist

## 2021-12-18 DIAGNOSIS — R202 Paresthesia of skin: Secondary | ICD-10-CM

## 2021-12-18 DIAGNOSIS — M48062 Spinal stenosis, lumbar region with neurogenic claudication: Secondary | ICD-10-CM

## 2021-12-18 DIAGNOSIS — M4722 Other spondylosis with radiculopathy, cervical region: Secondary | ICD-10-CM

## 2021-12-28 ENCOUNTER — Other Ambulatory Visit: Payer: Self-pay | Admitting: Specialist

## 2021-12-28 DIAGNOSIS — M48062 Spinal stenosis, lumbar region with neurogenic claudication: Secondary | ICD-10-CM

## 2021-12-28 DIAGNOSIS — M4722 Other spondylosis with radiculopathy, cervical region: Secondary | ICD-10-CM

## 2021-12-28 DIAGNOSIS — R200 Anesthesia of skin: Secondary | ICD-10-CM

## 2021-12-28 MED ORDER — TRAMADOL HCL 50 MG PO TABS: ORAL_TABLET | ORAL | 0 refills | Status: DC

## 2021-12-28 NOTE — Telephone Encounter (Signed)
Pt would like refill on tramadol he needs to be able to have 2 a day.  ?

## 2021-12-28 NOTE — Addendum Note (Signed)
Addended by: Minda Ditto, Alyse Low N on: 12/28/2021 02:06 PM ? ? Modules accepted: Orders ? ?

## 2022-01-17 ENCOUNTER — Encounter: Payer: Self-pay | Admitting: Family Medicine

## 2022-01-17 ENCOUNTER — Ambulatory Visit: Payer: Medicaid Other | Admitting: Family Medicine

## 2022-01-17 VITALS — BP 138/82 | HR 57 | Ht 68.0 in | Wt 192.0 lb

## 2022-01-17 DIAGNOSIS — R569 Unspecified convulsions: Secondary | ICD-10-CM

## 2022-01-17 DIAGNOSIS — G4733 Obstructive sleep apnea (adult) (pediatric): Secondary | ICD-10-CM | POA: Diagnosis not present

## 2022-01-17 DIAGNOSIS — Z9989 Dependence on other enabling machines and devices: Secondary | ICD-10-CM

## 2022-01-17 NOTE — Patient Instructions (Addendum)
Below is our plan: ? ?We will continue levetiracetam XR '1500mg'$  daily. Please make sure to take meds at the same time every day. Please stay well hydrated and get plenty of sleep. Please continue CPAP therapy every night for at least 4 hours.  ? ?Please make sure you are consistent with timing of seizure medication. I recommend annual visit with primary care provider (PCP) for complete physical and routine blood work. We will monitor vitamin D level. I recommend daily intake of vitamin D (400-800iu) and calcium (800-'1000mg'$ ) for bone health. Discuss Dexa screening with PCP.  ? ?According to [place] law, you can not drive unless you are seizure / syncope free for at least 6 months and under physician's care. ? ?Please maintain precautions. Do not participate in activities where a loss of awareness could harm you or someone else. No swimming alone, no tub bathing, no hot tubs, no driving, no operating motorized vehicles (cars, ATVs, motocycles, etc), lawnmowers, power tools or firearms. No standing at heights, such as rooftops, ladders or stairs. Avoid hot objects such as stoves, heaters, open fires. Wear a helmet when riding a bicycle, scooter, skateboard, etc. and avoid areas of traffic. Set your water heater to 120 degrees or less.  ? ?Please make sure you are staying well hydrated. I recommend 50-60 ounces daily. Well balanced diet and regular exercise encouraged. Consistent sleep schedule with 6-8 hours recommended.  ? ?Please continue follow up with care team as directed.  ? ?Follow up with me in 1 year  ? ?You may receive a survey regarding today's visit. I encourage you to leave honest feed back as I do use this information to improve patient care. Thank you for seeing me today!  ? ? ? ?

## 2022-01-17 NOTE — Progress Notes (Signed)
? ? ?PATIENT: Francis Jackson ?DOB: August 27, 1966 ? ?REASON FOR VISIT: follow up ?HISTORY FROM: patient ? ?Chief Complaint  ?Patient presents with  ? Obstructive Sleep Apnea  ?  Rm 1, alone. Here for 6 month f/u for sz and CPAP. Doing well on CPAP. No sz like activity since last ov.   ?  ? ?HISTORY: (copied from my note on 01/12/2020) ?  ?HISTORY Francis Jackson is all 56 years old right-handed [place] male, native of [place], return to clinic for followup seizure, last clinical visit was May 2013 with [name] ?  ?He began to have seizure since August 05 2009, it was nocturnal seizure, generalized tonic-clonic lasting about 10-15 minutes. ?  ?Evaluation showed normal MRI of the brain, EEG, laboratory showed normal CBC, CMP, UA, negative UDS. ?  ?Second nocturnal seizure July 2011, third seizure in February 2012, he was visiting his friend with his wife, he began to circling around, confused, and then went to tonic-clonic , he was started on Keppra 500 mg twice a day in 2012, ?  ?4 seizure was in June 2013, while he was not compliant with his Keppra, he was taken to emergency room, had multiple abrasions  to his face, and right eye, CAT scan of the brain and neck showed no significant abnormality. ?  ?Fifths seizure in May 2014, again happened when he was not compliant with his Keppra. Proceeding by rising sensation, nauseous, ?  ?He also complains of symptoms consistent with obstructive sleep apnea, had sleep study in January 2011, at [facility], by Dr. Kimberlee Nearing, there was evidence of mild obstructive sleep apnea, lowest SpO2 was 82%, he was given advise of losing weight, sleep on his side, he continued to have frequent snoring, wife has to shake him to stop snoring, start breathing almost every night ?  ?UPDATE December 19 2016: Francis Jackson clinical visit was on December 20 2015, as the seizure was in July 10 2014 after he misses his Keppra dose, is now taking Keppra 500 mg twice a day. ?  ?He has been compliant with his CPAP  machine, which help him sleep better ?  ?UPDATE April 11th,  2019[name] 56 year old male returns for follow-up, with history of seizure disorder.  Last seizure occurred in 2015.  He is currently on Keppra XR 500 mg 2 tablets at bedtime.  He denies missing any doses.  CPAP compliance dated 11/17/2017- 12/16/2017 shows compliance greater than 4 hours for 60% for 18 days.  Average usage 4 hours 52 minutes.  Set pressure 7 cm.  EPR level 1.  AHI 7.7 ESS 5.  Patient reports he does not feel he is getting enough pressure from his CPAP.  FSS 16.  He returns for reevaluation ?  ?Today 04/14/18 ?Francis Jackson is a 56 year old male with a history of obstructive sleep apnea on CPAP as well as seizures.  His CPAP download indicates that he uses machine 25 out of 90 days for compliance of 83%.  He uses machine greater than 4 hours 51 out of 90 days for compliance of 57%.  On average he uses his machine 5 hours.  His residual AHI is 3.7 on 8 cm of water with EPR of 1.  The patient states that sometimes he does not go to bed till after 1 AM.  Patient repots that he does not use the machine during the day when he naps.  He remains on Keppra.  Denies any seizure events.  He returns today for  evaluation. ?  ?UPDATE 02/16/19 ALL ?Francis Jackson is a 56 y.o. male here today for follow up of OSA on CPAP.  He is doing very well with CPAP therapy.  Download report dated 01/13/2019 through 02/11/2019 reveals that he is used his CPAP 29 out of the last 30 days for compliance of 97%.  29 of the days he used his machine for greater than 4 hours for compliance of 97%.  Average usage was 6 hours and 22 minutes.  AHI was 4.7 on 8 cm of water with an EPR of 1.  There is no significant leak.  He states that his machine is older and is requesting a replacement CPAP.  He has reached out to his DME provider who asked for updated follow-up.  Otherwise he is doing very well and notes significant benefit with CPAP therapy.  He also continues with Keppra 1000  mg every night.  He is tolerating medication well.  He denies any seizure activity. ?  ?UPDATE 01/12/2020 ALL ?Francis Jackson is a 56 y.o. male here today for follow up of OSA on CPAP and seizure. He admits that he has gotten out of the habit of using CPAP. He denies any concerns with therapy. He wishes to resume daily therapy.   Compliance report dated 12/12/2019 through 01/10/2020 reveals that he has used CPAP therapy 9 of the past 30 days for compliance of 30%.  He did use CPAP greater than 4 hours those 9 days for compliance of 30%.  Average use on days used was 5 hours and 13 minutes.  Residual AHI was 4.0 on 8 cm of water.  There was no leak noted.  Review of 90-day compliance report dated 10/13/2019 through 01/10/2020 reveals daily compliance of 60% and 4-hour compliance of 52%.  He continues levetiracetam XR 1,'000mg'$  daily. No recent seizure activity. He is feeling well and without concerns today.  ? ?UPDATE 07/19/2020 ALL:  ?Francis Jackson is a 56 y.o. male here today for follow up for OSA on CPAP and seizures. He continues levetiracetam XR '1000mg'$  for seizure management and gabapentin '300mg'$  daily as needed for neck pain. No seizure activity since 2015.  ? ?Compliance report dated 06/18/2020-07/17/2020 reveals a use CPAP 27 of the past 30 days for compliance of 90%.  The CPAP greater than 4 hours 14 of the past 30 days for compliance of 47%.  Average usage on days used was 4 hours and 18 minutes.  Residual AHI was 4.5 on a set pressure of 8 cm of water.  There was no significant leak noted. He works as an Mining engineer and does not always get to sleep for 4 hours a night. He does feel sleepy during the daytime but feels CPAP therapy has helped significantly.  ? ?UPDATE 01/17/22 ALL ?Francis Jackson is a 56 y.o. male here today for follow up for OSA on CPAP and seizures. He was last seen by Dr [name] 07/20/2021 and reported a breakthrough seizure. He was advised to increase levetiracetam XR to '1500mg'$  for seizure management. No  longer taking gabapentin for neck pain. No seizure activity since. He has resumed driving. He works for [employer] He typically works from 4-10am then again from 4p-8p.  ? ?He continues CPAP therapy nightly for about 4-5 hours. He has continued to note benefit with CPAP. He has less daytime sleepiness. He denies sleepiness with driving.  ? ? ? ? ?REVIEW OF SYSTEMS: Out of a complete 14 system review of symptoms, the  patient complains only of the following symptoms, none and all other reviewed systems are negative. ? ?ESS: 2 ? ?ALLERGIES: ?No Known Allergies ? ?HOME MEDICATIONS: ?Outpatient Medications Prior to Visit  ?Medication Sig Dispense Refill  ? cyclobenzaprine (FLEXERIL) 10 MG tablet Take 1 tablet (10 mg total) by mouth 2 (two) times daily as needed for muscle spasms. 20 tablet 0  ? HYDROcodone-acetaminophen (NORCO/VICODIN) 5-325 MG tablet Take 1 tablet by mouth every 4 (four) hours as needed. 10 tablet 0  ? levETIRAcetam (KEPPRA XR) 500 MG 24 hr tablet Take 3 tablets (1,500 mg total) by mouth at bedtime. 270 tablet 3  ? methylPREDNISolone (MEDROL) 4 MG tablet 6 day taper to be taken as directed 21 tablet 0  ? metoprolol succinate (TOPROL-XL) 50 MG 24 hr tablet Take 50 mg by mouth daily.  5  ? neomycin-polymyxin-pramoxine (NEOSPORIN PLUS) 1 % cream Apply topically 2 (two) times daily. 14.2 g 0  ? traMADol (ULTRAM) 50 MG tablet TAKE 2 TABLETS BY MOUTH EVERY 6 HOURS AS NEEDED FOR UP TO 7 DAYS FOR MODERATE PAIN. 40 tablet 0  ? ?No facility-administered medications prior to visit.  ? ? ?PAST MEDICAL HISTORY: ?Past Medical History:  ?Diagnosis Date  ? Acute urinary retention 12/22/2011  ? BPH (benign prostatic hyperplasia) 08/24/2013  ? Gross hematuria 12/22/2011  ? History of kidney stones   ? History of urinary retention   ? 12/2011  ? Migraine   ? Mild obstructive sleep apnea   ? PER STUDY 09-14-2009-uses CPAP  ? Seizure disorder, grand mal ([place])   ? PER PT LAST SEIZURE 01/2013  ? Seizures ([place])   ? Ureteral calculus  12/22/2011  ? ? ?PAST SURGICAL HISTORY: ?Past Surgical History:  ?Procedure Laterality Date  ? CYSTO/  RIGHT RETROGRADE PYELOGRAM/  URETEROSCOPY  12-23-2011  ? EXCISION LIPOMA OF SCALP AND FACE  07-15-

## 2022-01-18 NOTE — Progress Notes (Signed)
CM sent to [facility] for new order ?

## 2022-03-02 ENCOUNTER — Ambulatory Visit: Payer: Medicaid Other | Admitting: Specialist

## 2022-03-02 ENCOUNTER — Encounter: Payer: Self-pay | Admitting: Specialist

## 2022-03-02 VITALS — BP 162/91 | HR 50 | Ht 68.0 in | Wt 192.0 lb

## 2022-03-02 DIAGNOSIS — R200 Anesthesia of skin: Secondary | ICD-10-CM | POA: Diagnosis not present

## 2022-03-02 DIAGNOSIS — M5416 Radiculopathy, lumbar region: Secondary | ICD-10-CM

## 2022-03-02 DIAGNOSIS — M48062 Spinal stenosis, lumbar region with neurogenic claudication: Secondary | ICD-10-CM

## 2022-03-02 DIAGNOSIS — R202 Paresthesia of skin: Secondary | ICD-10-CM

## 2022-03-02 DIAGNOSIS — M47816 Spondylosis without myelopathy or radiculopathy, lumbar region: Secondary | ICD-10-CM

## 2022-03-02 DIAGNOSIS — M48061 Spinal stenosis, lumbar region without neurogenic claudication: Secondary | ICD-10-CM | POA: Diagnosis not present

## 2022-03-22 ENCOUNTER — Emergency Department (HOSPITAL_COMMUNITY)
Admission: EM | Admit: 2022-03-22 | Discharge: 2022-03-23 | Disposition: A | Discharge status: Home / Self Care | Payer: Medicaid Other | Attending: Emergency Medicine | Admitting: Emergency Medicine

## 2022-03-22 ENCOUNTER — Other Ambulatory Visit: Payer: Self-pay

## 2022-03-22 ENCOUNTER — Encounter (HOSPITAL_COMMUNITY): Payer: Self-pay

## 2022-03-22 DIAGNOSIS — Z20822 Contact with and (suspected) exposure to covid-19: Secondary | ICD-10-CM | POA: Insufficient documentation

## 2022-03-22 DIAGNOSIS — J029 Acute pharyngitis, unspecified: Secondary | ICD-10-CM

## 2022-03-22 LAB — BASIC METABOLIC PANEL
Anion gap: 7 (ref 5–15)
BUN: 12 mg/dL (ref 6–20)
CO2: 27 mmol/L (ref 22–32)
Calcium: 9 mg/dL (ref 8.9–10.3)
Chloride: 105 mmol/L (ref 98–111)
Creatinine, Ser: 0.97 mg/dL (ref 0.61–1.24)
GFR, Estimated: >60 mL/min (ref >60)
Glucose, Bld: 80 mg/dL (ref 70–99)
Potassium: 3.8 mmol/L (ref 3.5–5.1)
Sodium: 139 mmol/L (ref 135–145)

## 2022-03-22 LAB — CBC WITH DIFFERENTIAL/PLATELET
Abs Immature Granulocytes: 0.11 10*3/uL — ABNORMAL HIGH (ref 0.00–0.07)
Basophils Absolute: 0 10*3/uL (ref 0.0–0.1)
Basophils Relative: 0 %
Eosinophils Absolute: 0.1 10*3/uL (ref 0.0–0.5)
Eosinophils Relative: 1 %
HCT: 50.9 % (ref 39.0–52.0)
Hemoglobin: 16.3 g/dL (ref 13.0–17.0)
Immature Granulocytes: 1 %
Lymphocytes Relative: 18 %
Lymphs Abs: 1.6 10*3/uL (ref 0.7–4.0)
MCH: 25.8 pg — ABNORMAL LOW (ref 26.0–34.0)
MCHC: 32 g/dL (ref 30.0–36.0)
MCV: 80.7 fL (ref 80.0–100.0)
Monocytes Absolute: 0.8 10*3/uL (ref 0.1–1.0)
Monocytes Relative: 9 %
Neutro Abs: 6.2 10*3/uL (ref 1.7–7.7)
Neutrophils Relative %: 71 %
Platelets: 265 10*3/uL (ref 150–400)
RBC: 6.31 MIL/uL — ABNORMAL HIGH (ref 4.22–5.81)
RDW: 14.3 % (ref 11.5–15.5)
WBC: 8.8 10*3/uL (ref 4.0–10.5)
nRBC: 0 % (ref 0.0–0.2)

## 2022-03-22 LAB — SARS CORONAVIRUS 2 BY RT PCR: SARS Coronavirus 2 by RT PCR: NEGATIVE

## 2022-03-22 LAB — GROUP A STREP BY PCR: Group A Strep by PCR: NOT DETECTED

## 2022-03-22 MED ORDER — ACETAMINOPHEN 325 MG PO TABS
650.0000 mg | ORAL_TABLET | Freq: Once | ORAL | Status: AC
  Administered 2022-03-22: 650 mg via ORAL
  Filled 2022-03-22: qty 2

## 2022-03-22 NOTE — ED Provider Triage Note (Signed)
Emergency Medicine Provider Triage Evaluation Note  Francis Jackson , a 56 y.o. male  was evaluated in triage.  Pt complains of sore throat, shaking chills.  Symptoms began 4 days ago.  Sore throat worse to right side.  Hurts to swallow.  No sick contacts.  No vomiting.  No cough, shortness of breath  Review of Systems  Positive: Sore throat, fever Negative:   Physical Exam  BP (!) 169/105 (BP Location: Left Arm)   Pulse 74   Temp 100.1 F (37.8 C) (Oral)   Resp 17   SpO2 100%  Gen:   Awake, no distress   Resp:  Normal effort  PO:  Erythematous, uvula midline MSK:   Moves extremities without difficulty  Other:    Medical Decision Making  Medically screening exam initiated at 9:41 PM.  Appropriate orders placed.  Francis Jackson was informed that the remainder of the evaluation will be completed by another provider, this initial triage assessment does not replace that evaluation, and the importance of remaining in the ED until their evaluation is complete.  Sore throat, fever   Prerna Harold A, PA-C 03/22/22 [id number]

## 2022-03-22 NOTE — ED Triage Notes (Signed)
Pt presents to ED from home with c/o right side throat pain and swelling x 4 days. Pt endorses difficulty swallowing and states pain is now in his right ear and he has a headache.

## 2022-03-23 MED ORDER — DEXAMETHASONE 4 MG PO TABS
6.0000 mg | ORAL_TABLET | Freq: Once | ORAL | Status: AC
  Administered 2022-03-23: 6 mg via ORAL
  Filled 2022-03-23: qty 1

## 2022-03-23 MED ORDER — LIDOCAINE VISCOUS HCL 2 % MT SOLN
15.0000 mL | Freq: Once | OROMUCOSAL | Status: AC
  Administered 2022-03-23: 15 mL via OROMUCOSAL
  Filled 2022-03-23: qty 15

## 2022-03-23 NOTE — Discharge Instructions (Addendum)
You are seen today in the emergency department due to sore throat.  You are negative for strep and COVID.  Your blood counts are all within normal limits, you can take Tylenol and Motrin at home for fevers.    You were treated today in the emergency department with viscous lidocaine which can help numb the throat and a steroid which will stay in your system for 3 days and help reduce inflammation.  Most of the time this is due to a viral infection, this gets better without antibiotics.    If you start having difficulty swallowing, swelling under your jaw, new or worsening symptoms return back to the ED for further evaluation.  Follow-up with your primary for reevaluation if not improved in the next 3 days.

## 2022-03-23 NOTE — ED Provider Notes (Signed)
[facility] DEPT Provider Note   CSN: [id number] Arrival date & time: 03/22/22  2044     History  Chief Complaint  Patient presents with   Sore Throat    FREDDI SCHRAGER is a 56 y.o. male.   Sore Throat    Patient with medical history of BPH, migraines, OSA presents today due to sore throat x4 days.  Happens worse when he swallows, feels it mostly on the right side of his throat.  He denies any fevers at home but it had an elevated temperature of 100.1 here in the ED.  He endorses pain that moves to the right side of his jaw, denies any dental pain or abscess.  He has not had any sick contacts, not having any chest pain or shortness of breath.  No history of being immunocompromised, no history specifically of HIV, radiation or chemotherapy, IV drug use.  Home Medications Prior to Admission medications   Medication Sig Start Date End Date Taking? Authorizing Provider  cyclobenzaprine (FLEXERIL) 10 MG tablet Take 1 tablet (10 mg total) by mouth 2 (two) times daily as needed for muscle spasms. 02/15/21   Charlann Lange, PA-C  HYDROcodone-acetaminophen (NORCO/VICODIN) 5-325 MG tablet Take 1 tablet by mouth every 4 (four) hours as needed. 02/15/21   Charlann Lange, PA-C  levETIRAcetam (KEPPRA XR) 500 MG 24 hr tablet Take 3 tablets (1,500 mg total) by mouth at bedtime. 07/20/21   Marcial Pacas, MD  methylPREDNISolone (MEDROL) 4 MG tablet 6 day taper to be taken as directed 02/15/21   Lanae Crumbly, PA-C  metoprolol succinate (TOPROL-XL) 50 MG 24 hr tablet Take 50 mg by mouth daily. 11/11/17   [provider]  neomycin-polymyxin-pramoxine (NEOSPORIN PLUS) 1 % cream Apply topically 2 (two) times daily. 03/11/20   Sable Feil, PA-C  traMADol (ULTRAM) 50 MG tablet TAKE 2 TABLETS BY MOUTH EVERY 6 HOURS AS NEEDED FOR UP TO 7 DAYS FOR MODERATE PAIN. 12/28/21   Jessy Oto, MD      Allergies    Patient has no known allergies.    Review of Systems   Review of  Systems  Physical Exam Updated Vital Signs BP (!) 146/103   Pulse 83   Temp 100.1 F (37.8 C) (Oral)   Resp 16   Ht '5\' 8"'$  (1.727 m)   Wt 87.1 kg   SpO2 98%   BMI 29.19 kg/m  Physical Exam Vitals and nursing note reviewed. Exam conducted with a chaperone present.  Constitutional:      Appearance: Normal appearance.  HENT:     Head: Normocephalic and atraumatic.     Right Ear: Tympanic membrane normal.     Left Ear: Tympanic membrane normal.     Mouth/Throat:     Pharynx: Uvula midline.     Comments: Uvula is midline, slight erythema.  No trismus, no submandibular or sublingual swelling or tenderness.  Handling secretions without difficulty. Eyes:     General: No scleral icterus.       Right eye: No discharge.        Left eye: No discharge.     Extraocular Movements: Extraocular movements intact.     Pupils: Pupils are equal, round, and reactive to light.  Neck:     Thyroid: No thyromegaly.  Cardiovascular:     Rate and Rhythm: Normal rate and regular rhythm.     Pulses: Normal pulses.     Heart sounds: Normal heart sounds. No murmur heard.  No friction rub. No gallop.  Pulmonary:     Effort: Pulmonary effort is normal. No respiratory distress.     Breath sounds: Normal breath sounds.  Abdominal:     General: Abdomen is flat. Bowel sounds are normal. There is no distension.     Palpations: Abdomen is soft.     Tenderness: There is no abdominal tenderness.  Musculoskeletal:     Cervical back: Normal range of motion.  Skin:    General: Skin is warm and dry.     Coloration: Skin is not jaundiced.  Neurological:     Mental Status: He is alert. Mental status is at baseline.     Coordination: Coordination normal.     ED Results / Procedures / Treatments   Labs (all labs ordered are listed, but only abnormal results are displayed) Labs Reviewed  CBC WITH DIFFERENTIAL/PLATELET - Abnormal; Notable for the following components:      Result Value   RBC 6.31 (*)     MCH 25.8 (*)    Abs Immature Granulocytes 0.11 (*)    All other components within normal limits  SARS CORONAVIRUS 2 BY RT PCR  GROUP A STREP BY PCR  BASIC METABOLIC PANEL    EKG None  Radiology No results found.  Procedures Procedures    Medications Ordered in ED Medications  acetaminophen (TYLENOL) tablet 650 mg (650 mg Oral Given 03/22/22 2226)  lidocaine (XYLOCAINE) 2 % viscous mouth solution 15 mL (15 mLs Mouth/Throat Given 03/23/22 0155)  dexamethasone (DECADRON) tablet 6 mg (6 mg Oral Given 03/23/22 0154)    ED Course/ Medical Decision Making/ A&P                           Medical Decision Making Risk Prescription drug management.   Patient presents due to sore throat.  His temperature is elevated 100.1 but he is not technically febrile.  Does not meet sepsis criteria.  I considered peritonsillar abscess but no absence of that, uvula is midline.  Considered retropharyngeal abscess but no trismus, handling secretions and ultimately does not seem to fit clinical picture.  Considered Ludwig angina, no sublingual swelling or tenderness so less likely.   Patient is negative for strep and COVID.  BMP is unremarkable, CBC without any leukocytosis or underlying anemia.  Considered imaging of the throat but I have very low suspicion for retropharyngeal abscess or deeper soft tissue infection in the neck.  Suspect likely viral etiology.  I did order viscous lidocaine and Decadron for symptom control, this helped somewhat.  Will discharge patient with viscous lidocaine and have him follow-up with his PCP in the 3 days if no improvement. Return precautions discussed.         Final Clinical Impression(s) / ED Diagnoses Final diagnoses:  Sore throat    Rx / DC Orders ED Discharge Orders     None         Sherrill Raring, [place] 03/23/22 0523    Truddie Hidden, MD 03/23/22 [phone]

## 2022-03-29 ENCOUNTER — Other Ambulatory Visit: Payer: Self-pay | Admitting: Specialist

## 2022-03-29 DIAGNOSIS — M48062 Spinal stenosis, lumbar region with neurogenic claudication: Secondary | ICD-10-CM

## 2022-03-29 DIAGNOSIS — R200 Anesthesia of skin: Secondary | ICD-10-CM

## 2022-03-29 DIAGNOSIS — M4722 Other spondylosis with radiculopathy, cervical region: Secondary | ICD-10-CM

## 2022-05-29 ENCOUNTER — Other Ambulatory Visit: Payer: Self-pay | Admitting: Internal Medicine

## 2022-05-29 ENCOUNTER — Other Ambulatory Visit (HOSPITAL_COMMUNITY): Payer: Self-pay | Admitting: Internal Medicine

## 2022-05-29 DIAGNOSIS — M79672 Pain in left foot: Secondary | ICD-10-CM

## 2022-05-30 ENCOUNTER — Ambulatory Visit (HOSPITAL_COMMUNITY)
Admission: RE | Admit: 2022-05-30 | Discharge: 2022-05-30 | Disposition: A | Discharge status: Home / Self Care | Payer: Medicaid Other | Source: Ambulatory Visit | Attending: Internal Medicine | Admitting: Internal Medicine

## 2022-05-30 DIAGNOSIS — M79672 Pain in left foot: Secondary | ICD-10-CM | POA: Diagnosis not present

## 2022-05-30 LAB — COMPLETE METABOLIC PANEL WITH GFR
AG Ratio: 1.6 (calc) (ref 1.0–2.5)
ALT: 15 U/L (ref 9–46)
AST: 20 U/L (ref 10–35)
Albumin: 4.4 g/dL (ref 3.6–5.1)
Alkaline phosphatase (APISO): 61 U/L (ref 35–144)
BUN: 16 mg/dL (ref 7–25)
CO2: 24 mmol/L (ref 20–32)
Calcium: 9.4 mg/dL (ref 8.6–10.3)
Chloride: 105 mmol/L (ref 98–110)
Creat: 1.12 mg/dL (ref 0.70–1.30)
Globulin: 2.7 g/dL (calc) (ref 1.9–3.7)
Glucose, Bld: 125 mg/dL — ABNORMAL HIGH (ref 65–99)
Potassium: 4.1 mmol/L (ref 3.5–5.3)
Sodium: 140 mmol/L (ref 135–146)
Total Bilirubin: 0.4 mg/dL (ref 0.2–1.2)
Total Protein: 7.1 g/dL (ref 6.1–8.1)
eGFR: 78 mL/min/{1.73_m2} (ref >=60)

## 2022-05-30 LAB — CBC
HCT: 46.4 % (ref 38.5–50.0)
Hemoglobin: 15 g/dL (ref 13.2–17.1)
MCH: 25.5 pg — ABNORMAL LOW (ref 27.0–33.0)
MCHC: 32.3 g/dL (ref 32.0–36.0)
MCV: 78.8 fL — ABNORMAL LOW (ref 80.0–100.0)
MPV: 10 fL (ref 7.5–12.5)
Platelets: 284 10*3/uL (ref 140–400)
RBC: 5.89 10*6/uL — ABNORMAL HIGH (ref 4.20–5.80)
RDW: 14 % (ref 11.0–15.0)
WBC: 4.2 10*3/uL (ref 3.8–10.8)

## 2022-05-30 LAB — LIPID PANEL
Cholesterol: 180 mg/dL (ref <200)
HDL: 39 mg/dL — ABNORMAL LOW (ref >=40)
LDL Cholesterol (Calc): 121 mg/dL (calc) — ABNORMAL HIGH
Non-HDL Cholesterol (Calc): 141 mg/dL (calc) — ABNORMAL HIGH (ref <130)
Total CHOL/HDL Ratio: 4.6 (calc) (ref <5.0)
Triglycerides: 95 mg/dL (ref <150)

## 2022-05-30 LAB — PSA: PSA: 4.14 ng/mL — ABNORMAL HIGH (ref <=4.00)

## 2022-05-30 LAB — TSH: TSH: 0.59 mIU/L (ref 0.40–4.50)

## 2022-05-30 LAB — VITAMIN D 25 HYDROXY (VIT D DEFICIENCY, FRACTURES): Vit D, 25-Hydroxy: 27 ng/mL — ABNORMAL LOW (ref 30–100)

## 2022-07-03 ENCOUNTER — Ambulatory Visit (INDEPENDENT_AMBULATORY_CARE_PROVIDER_SITE_OTHER): Payer: Medicaid Other

## 2022-07-03 ENCOUNTER — Ambulatory Visit (INDEPENDENT_AMBULATORY_CARE_PROVIDER_SITE_OTHER): Payer: Medicaid Other | Admitting: Podiatry

## 2022-07-03 DIAGNOSIS — M79671 Pain in right foot: Secondary | ICD-10-CM | POA: Diagnosis not present

## 2022-07-03 DIAGNOSIS — M7751 Other enthesopathy of right foot: Secondary | ICD-10-CM

## 2022-07-03 DIAGNOSIS — M79672 Pain in left foot: Secondary | ICD-10-CM

## 2022-07-03 DIAGNOSIS — G629 Polyneuropathy, unspecified: Secondary | ICD-10-CM | POA: Diagnosis not present

## 2022-07-03 DIAGNOSIS — M7752 Other enthesopathy of left foot: Secondary | ICD-10-CM

## 2022-07-03 MED ORDER — GABAPENTIN 300 MG PO CAPS: 300.0000 mg | ORAL_CAPSULE | Freq: Every day | ORAL | 0 refills | Status: DC

## 2022-07-03 NOTE — Progress Notes (Unsigned)
Subjective:   Patient ID: Francis Jackson, male   DOB: 56 y.o.   MRN: [id number]   HPI Presents with pain to both feet on the bottom and top from midway up to the toes. The pain is constant. No recent injuries to the feet. This has been ongoing since 1989.he hhas tried soaking in salt water. His feet feel swollen. He saw his PCP and states they put him on an "antibiotic".    ROS      Objective:  Physical Exam  *** No pain feels hot    Assessment:  ***     Plan:  ***

## 2022-07-03 NOTE — Patient Instructions (Signed)
For inserts I like POWERSTEPS, SUPERFEET, and AETREX  Gabapentin Capsules or Tablets What is this medication? GABAPENTIN (GA ba pen tin) treats nerve pain. It may also be used to prevent and control seizures in people with epilepsy. It works by calming overactive nerves in your body. This medicine may be used for other purposes; ask your health care provider or pharmacist if you have questions. COMMON BRAND NAME(S): Active-PAC with Gabapentin, Orpha Bur, Gralise, Neurontin What should I tell my care team before I take this medication? They need to know if you have any of these conditions: Alcohol or substance use disorder Kidney disease Lung or breathing disease Suicidal thoughts, plans, or attempt; a previous suicide attempt by you or a family member An unusual or allergic reaction to gabapentin, other medications, foods, dyes, or preservatives Pregnant or trying to get pregnant Breast-feeding How should I use this medication? Take this medication by mouth with a glass of water. Follow the directions on the prescription label. You can take it with or without food. If it upsets your stomach, take it with food. Take your medication at regular intervals. Do not take it more often than directed. Do not stop taking except on your care team's advice. If you are directed to break the 600 or 800 mg tablets in half as part of your dose, the extra half tablet should be used for the next dose. If you have not used the extra half tablet within 28 days, it should be thrown away. A special MedGuide will be given to you by the pharmacist with each prescription and refill. Be sure to read this information carefully each time. Talk to your care team about the use of this medication in children. While this medication may be prescribed for children as young as 3 years for selected conditions, precautions do apply. Overdosage: If you think you have taken too much of this medicine contact a poison control center or  emergency room at once. NOTE: This medicine is only for you. Do not share this medicine with others. What if I miss a dose? If you miss a dose, take it as soon as you can. If it is almost time for your next dose, take only that dose. Do not take double or extra doses. What may interact with this medication? Alcohol Antihistamines for allergy, cough, and cold Certain medications for anxiety or sleep Certain medications for depression like amitriptyline, fluoxetine, sertraline Certain medications for seizures like phenobarbital, primidone Certain medications for stomach problems General anesthetics like halothane, isoflurane, methoxyflurane, propofol Local anesthetics like lidocaine, pramoxine, tetracaine Medications that relax muscles for surgery Opioid medications for pain Phenothiazines like chlorpromazine, mesoridazine, prochlorperazine, thioridazine This list may not describe all possible interactions. Give your health care provider a list of all the medicines, herbs, non-prescription drugs, or dietary supplements you use. Also tell them if you smoke, drink alcohol, or use illegal drugs. Some items may interact with your medicine. What should I watch for while using this medication? Visit your care team for regular checks on your progress. You may want to keep a record at home of how you feel your condition is responding to treatment. You may want to share this information with your care team at each visit. You should contact your care team if your seizures get worse or if you have any new types of seizures. Do not stop taking this medication or any of your seizure medications unless instructed by your care team. Stopping your medication suddenly can increase your seizures  or their severity. This medication may cause serious skin reactions. They can happen weeks to months after starting the medication. Contact your care team right away if you notice fevers or flu-like symptoms with a rash. The  rash may be red or purple and then turn into blisters or peeling of the skin. Or, you might notice a red rash with swelling of the face, lips or lymph nodes in your neck or under your arms. Wear a medical identification bracelet or chain if you are taking this medication for seizures. Carry a card that lists all your medications. This medication may affect your coordination, reaction time, or judgment. Do not drive or operate machinery until you know how this medication affects you. Sit up or stand slowly to reduce the risk of dizzy or fainting spells. Drinking alcohol with this medication can increase the risk of these side effects. Your mouth may get dry. Chewing sugarless gum or sucking hard candy, and drinking plenty of water may help. Watch for new or worsening thoughts of suicide or depression. This includes sudden changes in mood, behaviors, or thoughts. These changes can happen at any time but are more common in the beginning of treatment or after a change in dose. Call your care team right away if you experience these thoughts or worsening depression. If you become pregnant while using this medication, you may enroll in the [place] Pregnancy Registry by calling [phone]. This registry collects information about the safety of antiepileptic medication use during pregnancy. What side effects may I notice from receiving this medication? Side effects that you should report to your care team as soon as possible: Allergic reactions or angioedema--skin rash, itching, hives, swelling of the face, eyes, lips, tongue, arms, or legs, trouble swallowing or breathing Rash, fever, and swollen lymph nodes Thoughts of suicide or self harm, worsening mood, feelings of depression Trouble breathing Unusual changes in mood or behavior in children after use such as difficulty concentrating, hostility, or restlessness Side effects that usually do not require medical attention (report  to your care team if they continue or are bothersome): Dizziness Drowsiness Nausea Swelling of ankles, feet, or hands Vomiting This list may not describe all possible side effects. Call your doctor for medical advice about side effects. You may report side effects to FDA at 1-800-FDA-1088. Where should I keep my medication? Keep out of reach of children and pets. Store at room temperature between 15 and 30 degrees C (59 and 86 degrees F). Get rid of any unused medication after the expiration date. This medication may cause accidental overdose and death if taken by other adults, children, or pets. To get rid of medications that are no longer needed or have expired: Take the medication to a medication take-back program. Check with your pharmacy or law enforcement to find a location. If you cannot return the medication, check the label or package insert to see if the medication should be thrown out in the garbage or flushed down the toilet. If you are not sure, ask your care team. If it is safe to put it in the trash, empty the medication out of the container. Mix the medication with cat litter, dirt, coffee grounds, or other unwanted substance. Seal the mixture in a bag or container. Put it in the trash. NOTE: This sheet is a summary. It may not cover all possible information. If you have questions about this medicine, talk to your doctor, pharmacist, or health care provider.  2023 Elsevier/Gold Standard (  2020-08-30 00:00:00)  

## 2022-07-04 ENCOUNTER — Other Ambulatory Visit: Payer: Self-pay | Admitting: Neurology

## 2022-07-04 DIAGNOSIS — R569 Unspecified convulsions: Secondary | ICD-10-CM

## 2022-07-08 LAB — HEMOGLOBIN A1C
Hgb A1c MFr Bld: 6.4 % of total Hgb — ABNORMAL HIGH (ref <5.7)
Mean Plasma Glucose: 137 mg/dL
eAG (mmol/L): 7.6 mmol/L

## 2022-07-08 LAB — VITAMIN B1: Vitamin B1 (Thiamine): 9 nmol/L (ref 8–30)

## 2022-07-08 LAB — SEDIMENTATION RATE: Sed Rate: 2 mm/h (ref 0–20)

## 2022-07-08 LAB — HIV ANTIBODY (ROUTINE TESTING W REFLEX): HIV 1&2 Ab, 4th Generation: NONREACTIVE

## 2022-07-08 LAB — ANA: Anti Nuclear Antibody (ANA): NEGATIVE

## 2022-07-08 LAB — VITAMIN B12: Vitamin B-12: 684 pg/mL (ref 200–1100)

## 2022-07-12 ENCOUNTER — Other Ambulatory Visit: Payer: Self-pay | Admitting: Podiatry

## 2022-07-12 DIAGNOSIS — G629 Polyneuropathy, unspecified: Secondary | ICD-10-CM

## 2022-07-12 DIAGNOSIS — M7752 Other enthesopathy of left foot: Secondary | ICD-10-CM

## 2022-07-12 DIAGNOSIS — M7751 Other enthesopathy of right foot: Secondary | ICD-10-CM

## 2022-07-12 DIAGNOSIS — M79671 Pain in right foot: Secondary | ICD-10-CM

## 2022-08-14 ENCOUNTER — Encounter: Payer: Self-pay | Admitting: Neurology

## 2022-08-14 ENCOUNTER — Ambulatory Visit: Payer: Medicaid Other | Admitting: Neurology

## 2022-08-28 ENCOUNTER — Ambulatory Visit: Payer: Medicaid Other | Admitting: Podiatry

## 2022-10-16 ENCOUNTER — Ambulatory Visit: Payer: Medicaid Other | Admitting: Neurology

## 2022-10-16 ENCOUNTER — Encounter: Payer: Self-pay | Admitting: Neurology

## 2022-10-16 VITALS — BP 133/81 | HR 56 | Ht 68.0 in | Wt 191.5 lb

## 2022-10-16 DIAGNOSIS — R202 Paresthesia of skin: Secondary | ICD-10-CM

## 2022-10-16 DIAGNOSIS — R569 Unspecified convulsions: Secondary | ICD-10-CM

## 2022-10-16 DIAGNOSIS — G4733 Obstructive sleep apnea (adult) (pediatric): Secondary | ICD-10-CM

## 2022-10-16 MED ORDER — LEVETIRACETAM ER 500 MG PO TB24: 2000.0000 mg | ORAL_TABLET | Freq: Every day | ORAL | 3 refills | Status: DC

## 2022-10-16 NOTE — Patient Instructions (Signed)
Meds ordered this encounter  Medications   levETIRAcetam (KEPPRA XR) 500 MG 24 hr tablet    Sig: Take 4 tablets (2,000 mg total) by mouth at bedtime.    Dispense:  360 tablet    Refill:  3

## 2022-10-16 NOTE — Progress Notes (Signed)
PATIENT: Francis Jackson DOB: 01/21/1966  REASON FOR VISIT: follow up HISTORY FROM: patient  Chief Complaint  Patient presents with   Follow-up    Rm 12. Alone. He reports a seizure on 11/5 at 2:30 AM, lasting a few seconds.     ASSESSMENT AND PLAN   Complex partial seizure  Most recent recurrent was in November 2023 while taking Keppra XR 500 mg 3 tablets every night,  Increase to Keppra XR 500 mg  4 tablets every night  Repeat EEG  Check Keppra level  Call clinic for recurrent seizure  Obstructive sleep apnea  Reported compliant with his CPAP, seeing Amy  Return To Clinic With NP In 6 Months   DIAGNOSTIC DATA (LABS, IMAGING, TESTING) - I reviewed patient records, labs, notes, testing and imaging myself where available.   HISTORY:    Francis Jackson is all 57 years old right-handed [place] American male, native of [place], return to clinic for followup seizure, last clinical visit was May 2013 with [name]   He began to have seizure since August 05 2009, it was nocturnal seizure, generalized tonic-clonic lasting about 10-15 minutes.   Evaluation showed normal MRI of the brain, EEG, laboratory showed normal CBC, CMP, UA, negative UDS.   Second nocturnal seizure July 2011, third seizure in February 2012, he was visiting his friend with his wife, he began to circling around, confused, and then went to tonic-clonic , he was started on Keppra 500 mg twice a day in 2012,   4 seizure was in June 2013, while he was not compliant with his Keppra, he was taken to emergency room, had multiple abrasions  to his face, and right eye, CAT scan of the brain and neck showed no significant abnormality.   Fifths seizure in May 2014, again happened when he was not compliant with his Keppra. Proceeding by rising sensation, nauseous,   He also complains of symptoms consistent with obstructive sleep apnea, had sleep study in January 2011, at [facility] long, by Dr. Kimberlee Nearing, there was evidence of mild  obstructive sleep apnea, lowest SpO2 was 82%, he was given advise of losing weight, sleep on his side, he continued to have frequent snoring, wife has to shake him to stop snoring, start breathing almost every night  Update July 20, 2021, Over the years, he had occasionally recurrent seizure if not compliant with his Keppra, reported seizure July 10, 2014, has been doing very well, taking Keppra xr 500 mg 2 tablets every night,  CT head without contrast 2016 at [facility] was normal Had a recurrent seizure on July 16, 2021, happened while he was sitting at the pew at church, witnessed body jerking movement, then turning his body in prayer position, paramedic was called, by that time, he came around, did not go to hospital, patient stated he has been compliant with his medications,  He is also followed by our clinic for obstructive sleep apnea, CPAP machine,  UPDATE Oct 16 2022: He reported recurrent seizure in November 2023, happened in his sleep, 2 to 3 AM, witnessed by his wife, wake up with tongue biting, this happened while he was taking Keppra xr 500 3 tablets every night, he tolerated the medication well, no significant side effect noted   PHYSICAL EXAM  Vitals:   07/20/21 1545  BP: (!) 148/86  Pulse: (!) 52  Weight: 192 lb (87.1 kg)  Height: 5' 8"$  (1.727 m)   Body mass index is 29.19 kg/m.   PHYSICAL  EXAMNIATION:  Gen: NAD, conversant, well nourised, well groomed                     Cardiovascular: Regular rate rhythm, no peripheral edema, warm, nontender. Eyes: Conjunctivae clear without exudates or hemorrhage Neck: Supple, no carotid bruits. Pulmonary: Clear to auscultation bilaterally   NEUROLOGICAL EXAM:  MENTAL STATUS: Speech/Cognition: Awake, alert, normal speech, oriented to history taking and casual conversation.  CRANIAL NERVES: CN II: Visual fields are full to confrontation.  Pupils are round equal and briskly reactive to light. CN III, IV, VI:  extraocular movement are normal. No ptosis. CN V: Facial sensation is intact to light touch. CN VII: Face is symmetric with normal eye closure and smile. CN VIII: Hearing is normal to casual conversation CN IX, X: Palate elevates symmetrically. Phonation is normal. CN XI: Head turning and shoulder shrug are intact CN XII: Tongue is midline with normal movements and no atrophy.  MOTOR: Muscle bulk and tone are normal. Muscle strength is normal.  REFLEXES: Reflexes are 2  and symmetric at the biceps, triceps, knees and ankles. Plantar responses are flexor.  SENSORY: Intact to light touch, pinprick, positional and vibratory sensation at fingers and toes.  COORDINATION: There is no trunk or limb ataxia.    GAIT/STANCE: Posture is normal. Gait is steady with normal steps, base, arm swing and turning.    REVIEW OF SYSTEMS: Out of a complete 14 system review of symptoms, the patient complains only of the following symptoms,none  and all other reviewed systems are negative.  ESS: 21 FSS: 9  ALLERGIES: No Known Allergies  HOME MEDICATIONS: Outpatient Medications Prior to Visit  Medication Sig Dispense Refill   amLODipine (NORVASC) 5 MG tablet Take 5 mg by mouth daily.     cyclobenzaprine (FLEXERIL) 10 MG tablet Take 1 tablet (10 mg total) by mouth 2 (two) times daily as needed for muscle spasms. 20 tablet 0   HYDROcodone-acetaminophen (NORCO/VICODIN) 5-325 MG tablet Take 1 tablet by mouth every 4 (four) hours as needed. 10 tablet 0   levETIRAcetam (KEPPRA XR) 500 MG 24 hr tablet TAKE 3 TABLETS (1,500 MG TOTAL) BY MOUTH AT BEDTIME. 270 tablet 2   metoprolol succinate (TOPROL-XL) 50 MG 24 hr tablet Take 50 mg by mouth daily.  5   tamsulosin (FLOMAX) 0.4 MG CAPS capsule Take 0.4 mg by mouth daily.     traMADol (ULTRAM) 50 MG tablet TAKE 2 TABLETS BY MOUTH EVERY 6 HOURS AS NEEDED FOR UP TO 7 DAYS FOR MODERATE PAIN. 40 tablet 0   gabapentin (NEURONTIN) 300 MG capsule Take 1 capsule (300 mg  total) by mouth at bedtime. 90 capsule 0   methylPREDNISolone (MEDROL) 4 MG tablet 6 day taper to be taken as directed 21 tablet 0   neomycin-polymyxin-pramoxine (NEOSPORIN PLUS) 1 % cream Apply topically 2 (two) times daily. 14.2 g 0   No facility-administered medications prior to visit.    PAST MEDICAL HISTORY: Past Medical History:  Diagnosis Date   Acute urinary retention 12/22/2011   BPH (benign prostatic hyperplasia) 08/24/2013   Gross hematuria 12/22/2011   History of kidney stones    History of urinary retention    12/2011   Migraine    Mild obstructive sleep apnea    PER STUDY 09-14-2009-uses CPAP   Seizure disorder, grand mal ([place])    PER PT LAST SEIZURE 01/2013   Seizures ([place])    Ureteral calculus 12/22/2011    PAST SURGICAL HISTORY: Past Surgical History:  Procedure Laterality Date   CYSTO/  RIGHT RETROGRADE PYELOGRAM/  URETEROSCOPY  12-23-2011   EXCISION LIPOMA OF SCALP AND FACE  03-24-2005   INGUINAL HERNIA REPAIR Bilateral 10/18/2016   Procedure: LAPAROSCOPIC EXPLORATION AND REPAIR OF BILATERAL INGUINAL HERNIA WITH A TAPP REPAIR;  Surgeon: Michael Boston, MD;  Location: [facility];  Service: General;  Laterality: Bilateral;   INSERTION OF MESH Bilateral 10/18/2016   Procedure: INSERTION OF MESH;  Surgeon: Michael Boston, MD;  Location: [facility];  Service: General;  Laterality: Bilateral;   LAPAROSCOPIC LYSIS OF ADHESIONS  10/18/2016   Procedure: LAPAROSCOPIC LYSIS OF ADHESIONS;  Surgeon: Michael Boston, MD;  Location: [facility];  Service: General;;   SHOULDER SURGERY Left 2009   TRANSURETHRAL RESECTION OF PROSTATE N/A 08/24/2013   Procedure: TRANSURETHRAL RESECTION OF THE PROSTATE WITH GYRUS INSTRUMENTS;  Surgeon: Bernestine Amass, MD;  Location: [facility];  Service: Urology;  Laterality: N/A;    FAMILY HISTORY: History reviewed. No pertinent family history.  SOCIAL HISTORY: Social History   Socioeconomic History   Marital status: Married    Spouse name: Not on  file   Number of children: 4   Years of education: 12   Highest education level: Not on file  Occupational History   Occupation: Taxi driver  Tobacco Use   Smoking status: Never   Smokeless tobacco: Never  Vaping Use   Vaping Use: Never used  Substance and Sexual Activity   Alcohol use: No    Alcohol/week: 0.0 standard drinks of alcohol   Drug use: No   Sexual activity: Not on file  Other Topics Concern   Not on file  Social History Narrative   Patient lives at home with his wife Sueanne Margarita)   Patient works as a Architect part time.   Right handed   Patient drinks one cup of tea per week.   Patient has four children.      Originally from [place] in [place] Strain: Not on file  Food Insecurity: Not on file  Transportation Needs: Not on file  Physical Activity: Not on file  Stress: Not on file  Social Connections: Not on file  Intimate Partner Violence: Not on file      Marcial Pacas, M.D. Ph.D.  [facility] [place], [place] [address] Phone: [phone] Fax:      [phone]

## 2022-10-22 LAB — COMPREHENSIVE METABOLIC PANEL
ALT: 23 IU/L (ref 0–44)
AST: 24 IU/L (ref 0–40)
Albumin/Globulin Ratio: 1.9 (ref 1.2–2.2)
Albumin: 4.5 g/dL (ref 3.8–4.9)
Alkaline Phosphatase: 75 IU/L (ref 44–121)
BUN/Creatinine Ratio: 10 (ref 9–20)
BUN: 11 mg/dL (ref 6–24)
Bilirubin Total: 0.4 mg/dL (ref 0.0–1.2)
CO2: 25 mmol/L (ref 20–29)
Calcium: 9.2 mg/dL (ref 8.7–10.2)
Chloride: 103 mmol/L (ref 96–106)
Creatinine, Ser: 1.08 mg/dL (ref 0.76–1.27)
Globulin, Total: 2.4 g/dL (ref 1.5–4.5)
Glucose: 136 mg/dL — ABNORMAL HIGH (ref 70–99)
Potassium: 4.5 mmol/L (ref 3.5–5.2)
Sodium: 141 mmol/L (ref 134–144)
Total Protein: 6.9 g/dL (ref 6.0–8.5)
eGFR: 81 mL/min/{1.73_m2} (ref >59)

## 2022-10-22 LAB — CBC WITH DIFFERENTIAL/PLATELET
Basophils Absolute: 0 10*3/uL (ref 0.0–0.2)
Basos: 1 %
EOS (ABSOLUTE): 0.1 10*3/uL (ref 0.0–0.4)
Eos: 2 %
Hematocrit: 45.1 % (ref 37.5–51.0)
Hemoglobin: 14.3 g/dL (ref 13.0–17.7)
Immature Grans (Abs): 0 10*3/uL (ref 0.0–0.1)
Immature Granulocytes: 0 %
Lymphocytes Absolute: 1.8 10*3/uL (ref 0.7–3.1)
Lymphs: 43 %
MCH: 25.3 pg — ABNORMAL LOW (ref 26.6–33.0)
MCHC: 31.7 g/dL (ref 31.5–35.7)
MCV: 80 fL (ref 79–97)
Monocytes Absolute: 0.3 10*3/uL (ref 0.1–0.9)
Monocytes: 8 %
Neutrophils Absolute: 1.9 10*3/uL (ref 1.4–7.0)
Neutrophils: 46 %
Platelets: 293 10*3/uL (ref 150–450)
RBC: 5.65 x10E6/uL (ref 4.14–5.80)
RDW: 14.4 % (ref 11.6–15.4)
WBC: 4.1 10*3/uL (ref 3.4–10.8)

## 2022-10-22 LAB — MULTIPLE MYELOMA PANEL, SERUM
Albumin SerPl Elph-Mcnc: 3.9 g/dL (ref 2.9–4.4)
Albumin/Glob SerPl: 1.4 (ref 0.7–1.7)
Alpha 1: 0.2 g/dL (ref 0.0–0.4)
Alpha2 Glob SerPl Elph-Mcnc: 0.6 g/dL (ref 0.4–1.0)
B-Globulin SerPl Elph-Mcnc: 0.9 g/dL (ref 0.7–1.3)
Gamma Glob SerPl Elph-Mcnc: 1.4 g/dL (ref 0.4–1.8)
Globulin, Total: 3 g/dL (ref 2.2–3.9)
IgA/Immunoglobulin A, Serum: 144 mg/dL (ref 90–386)
IgG (Immunoglobin G), Serum: 1443 mg/dL (ref 603–1613)
IgM (Immunoglobulin M), Srm: 57 mg/dL (ref 20–172)
M Protein SerPl Elph-Mcnc: 0.3 g/dL — ABNORMAL HIGH

## 2022-10-22 LAB — LEVETIRACETAM LEVEL: Levetiracetam Lvl: 28.2 ug/mL (ref 10.0–40.0)

## 2022-10-22 LAB — HGB A1C W/O EAG: Hgb A1c MFr Bld: 6.8 % — ABNORMAL HIGH (ref 4.8–5.6)

## 2022-10-23 ENCOUNTER — Telehealth: Payer: Self-pay | Admitting: Neurology

## 2022-10-23 DIAGNOSIS — R202 Paresthesia of skin: Secondary | ICD-10-CM

## 2022-10-23 DIAGNOSIS — G4733 Obstructive sleep apnea (adult) (pediatric): Secondary | ICD-10-CM

## 2022-10-23 DIAGNOSIS — R569 Unspecified convulsions: Secondary | ICD-10-CM

## 2022-10-23 NOTE — Telephone Encounter (Signed)
Please call patient laboratory evaluation showed:  --- Within therapeutic Keppra level 28.2, Continue Keppra dose as discussed during office visit --- A1c 6.8, consistent with diagnosis of diabetes, I have forwarded result to his primary care Nolene Ebbs, MD he may contact him for treatment  ----Low titer M protein 0.3 has unknown chronic significance, need to have repeat test, in 6 to 12 months

## 2022-10-24 NOTE — Telephone Encounter (Signed)
I called the pt and updated on his results.    He verbalized understanding and appreciation for the call but he is confused about his Keppra dosage.   Per the last office the instructions say to take Keppra XR 500 three per day but the prescription says to take 4 tablets.  Patient needs clarification on this. He is scheduled for EEG on 2/21, he is ok with waiting and talking with the MD directly on this if she is agreeable.

## 2022-10-24 NOTE — Telephone Encounter (Signed)
Please call patient, he had recurrent seizure while taking Keppra xr 500 3 tablets every night, he should be on higher dose 4 tablets every night from now on, the new prescription from October 16, 2022 reflect that change.

## 2022-10-25 ENCOUNTER — Encounter: Payer: Self-pay | Admitting: Orthopedic Surgery

## 2022-10-25 ENCOUNTER — Ambulatory Visit: Payer: Medicaid Other | Admitting: Orthopedic Surgery

## 2022-10-25 ENCOUNTER — Ambulatory Visit (INDEPENDENT_AMBULATORY_CARE_PROVIDER_SITE_OTHER): Payer: Medicaid Other

## 2022-10-25 VITALS — BP 181/95 | HR 52 | Ht 68.0 in | Wt 192.0 lb

## 2022-10-25 DIAGNOSIS — M48062 Spinal stenosis, lumbar region with neurogenic claudication: Secondary | ICD-10-CM | POA: Diagnosis not present

## 2022-10-25 DIAGNOSIS — M5416 Radiculopathy, lumbar region: Secondary | ICD-10-CM

## 2022-10-25 DIAGNOSIS — R202 Paresthesia of skin: Secondary | ICD-10-CM

## 2022-10-25 DIAGNOSIS — M4722 Other spondylosis with radiculopathy, cervical region: Secondary | ICD-10-CM | POA: Diagnosis not present

## 2022-10-25 DIAGNOSIS — R200 Anesthesia of skin: Secondary | ICD-10-CM

## 2022-10-25 MED ORDER — LEVETIRACETAM ER 500 MG PO TB24: 2000.0000 mg | ORAL_TABLET | Freq: Every day | ORAL | 3 refills | Status: DC

## 2022-10-25 MED ORDER — TRAMADOL HCL 50 MG PO TABS: 50.0000 mg | ORAL_TABLET | Freq: Four times a day (QID) | ORAL | 0 refills | Status: DC | PRN

## 2022-10-25 NOTE — Telephone Encounter (Signed)
I called pt and advised on Dr. Rhea Belton instructions. He verbalized understanding and appreciation for the call.

## 2022-10-25 NOTE — Progress Notes (Signed)
Orthopedic Spine Surgery Office Note  Assessment: Patient is a 57 y.o. male with low back pain that radiates into his bilateral thighs consistent with neurogenic claudication. On MRI, has lumbar stenosis from L2-S1.    Plan: -Explained that initially conservative treatment is tried as a significant number of patients may experience relief with these treatment modalities. Discussed that the conservative treatments include:  -activity modification  -physical therapy  -over the counter pain medications  -medrol dosepak  -steroid injections -He is not interested in surgery and has gotten relief from steroid injections in the past so he would ike a referral for another one. A referral was provided to him today for bilateral L4 transforaminal injection (what he got last time 07/10/2021) -Refilled his tramadol prescription that Dr. Louanne Skye had been prescribing chronically.  I told him that I only use narcotics to control postoperative pain, but I would continue his until he can get into pain management since my partner had been doing that for him.  A referral was provided to him today -Patient should return to office on an as needed basis   Patient expressed understanding of the plan and all questions were answered to the patient's satisfaction.   ___________________________________________________________________________   History:  Patient is a 57 y.o. male who presents today for lumbar spine.  Patient has had several years of low back pain that radiates into his bilateral thighs.  He feels that his pain is gotten worse recently.  His leg pain gets better if he sits down.  It does not radiate past the knees.  There is no injury or trauma that brought on the pain.  Denies paresthesia numbness.  Has been getting treated with periodic injections and tramadol.  He feels that this gives some significant relief and is not interested in any surgery at this time.   Weakness: denies Symptoms of  imbalance: denies Paresthesias and numbness: denies Bowel or bladder incontinence: denies  Saddle anesthesia: denies  Treatments tried: Tramadol, steroid injections, activity modification, Tylenol  Review of systems: Denies fevers and chills, night sweats, unexplained weight loss, history of cancer, pain that wakes them at night  Past medical history: BPH History of kidney stones OSA Epilepsy  Allergies: NKDA  Past surgical history:  Lipoma excision Inguinal hernia repair Laparoscopic lysis of adhesions Left shoulder surgery TURP  Social history: Denies use of nicotine product (smoking, vaping, patches, smokeless) Alcohol use: Denies Denies recreational drug use   Physical Exam:  General: no acute distress, appears stated age Neurologic: alert, answering questions appropriately, following commands Respiratory: unlabored breathing on room air, symmetric chest rise Psychiatric: appropriate affect, normal cadence to speech   MSK (spine):  -Strength exam      Left  Right EHL    5/5  5/5 TA    5/5  5/5 GSC    5/5  5/5 Knee extension  5/5  5/5 Hip flexion   5/5  5/5  -Sensory exam    Sensation intact to light touch in L3-S1 nerve distributions of bilateral lower extremities  -Straight leg raise: Negative -Contralateral straight leg raise: Negative -Femoral nerve stretch test: Negative bilaterally -Clonus: no beats bilaterally  -Left hip exam: No pain through range of motion, negative Stinchfield, negative FABER -Right hip exam: No pain through range of motion, negative Stinchfield, negative FABER  Imaging: XR of the lumbar spine from 10/25/2022 was independently reviewed and interpreted, showing no fracture or dislocation. Disc height loss at L3/4, L4/5, L5/S1. No evidence of instability on flexion/extension views.  MRI of the lumbar spine from 02/18/2021 was independently reviewed and interpreted, showing central and lateral recess stenosis at L2/3, L3/4,  L4/5.  Lateral recess stenosis at L5/S1. Foraminal stenosis L3/4, L4/5, L5/S1 bilaterally.    Patient name: Francis Jackson Patient MRN: [id number] Date of visit: 10/25/22

## 2022-10-25 NOTE — Addendum Note (Signed)
Addended by: Verlin Grills on: 10/25/2022 11:42 AM   Modules accepted: Orders

## 2022-10-31 ENCOUNTER — Ambulatory Visit (INDEPENDENT_AMBULATORY_CARE_PROVIDER_SITE_OTHER): Payer: Medicaid Other | Admitting: Neurology

## 2022-10-31 DIAGNOSIS — R569 Unspecified convulsions: Secondary | ICD-10-CM | POA: Diagnosis not present

## 2022-10-31 DIAGNOSIS — R202 Paresthesia of skin: Secondary | ICD-10-CM

## 2022-10-31 DIAGNOSIS — G4733 Obstructive sleep apnea (adult) (pediatric): Secondary | ICD-10-CM

## 2022-11-12 ENCOUNTER — Ambulatory Visit: Payer: Self-pay

## 2022-11-12 ENCOUNTER — Ambulatory Visit (INDEPENDENT_AMBULATORY_CARE_PROVIDER_SITE_OTHER): Payer: Medicaid Other | Admitting: Physical Medicine and Rehabilitation

## 2022-11-12 VITALS — BP 146/90 | HR 58

## 2022-11-12 DIAGNOSIS — M5416 Radiculopathy, lumbar region: Secondary | ICD-10-CM | POA: Diagnosis not present

## 2022-11-12 MED ORDER — METHYLPREDNISOLONE ACETATE 80 MG/ML IJ SUSP
80.0000 mg | Freq: Once | INTRAMUSCULAR | Status: AC
  Administered 2022-11-12: 80 mg

## 2022-11-12 NOTE — Patient Instructions (Signed)

## 2022-11-12 NOTE — Progress Notes (Unsigned)
Functional Pain Scale - descriptive words and definitions  Distracting (5)    Aware of pain/able to complete some ADL's but limited by pain/sleep is affected and active distractions are only slightly useful. Moderate range order  Average Pain 8   +Driver, -BT, -Dye Allergies.  Lower back pain in the middle with some radiation into legs

## 2022-11-13 NOTE — Procedures (Signed)
Lumbosacral Transforaminal Epidural Steroid Injection - Sub-Pedicular Approach with Fluoroscopic Guidance  Patient: Francis Jackson      Date of Birth: 1966/07/23 MRN: [id number] PCP: Nolene Ebbs, MD      Visit Date: 11/12/2022   Universal Protocol:    Date/Time: 11/12/2022  Consent Given By: the patient  Position: PRONE  Additional Comments: Vital signs were monitored before and after the procedure. Patient was prepped and draped in the usual sterile fashion. The correct patient, procedure, and site was verified.   Injection Procedure Details:   Procedure diagnoses: Lumbar radiculopathy [M54.16]    Meds Administered:  Meds ordered this encounter  Medications   methylPREDNISolone acetate (DEPO-MEDROL) injection 80 mg    Laterality: Bilateral  Location/Site: L4  Needle:5.0 in., 22 ga.  Short bevel or Quincke spinal needle  Needle Placement: Transforaminal  Findings:    -Comments: Excellent flow of contrast along the nerve, nerve root and into the epidural space.  Procedure Details: After squaring off the end-plates to get a true AP view, the C-arm was positioned so that an oblique view of the foramen as noted above was visualized. The target area is just inferior to the "nose of the scotty dog" or sub pedicular. The soft tissues overlying this structure were infiltrated with 2-3 ml. of 1% Lidocaine without Epinephrine.  The spinal needle was inserted toward the target using a "trajectory" view along the fluoroscope beam.  Under AP and lateral visualization, the needle was advanced so it did not puncture dura and was located close the 6 O'Clock position of the pedical in AP tracterory. Biplanar projections were used to confirm position. Aspiration was confirmed to be negative for CSF and/or blood. A 1-2 ml. volume of Isovue-250 was injected and flow of contrast was noted at each level. Radiographs were obtained for documentation purposes.   After attaining the desired  flow of contrast documented above, a 0.5 to 1.0 ml test dose of 0.25% Marcaine was injected into each respective transforaminal space.  The patient was observed for 90 seconds post injection.  After no sensory deficits were reported, and normal lower extremity motor function was noted,   the above injectate was administered so that equal amounts of the injectate were placed at each foramen (level) into the transforaminal epidural space.   Additional Comments:  No complications occurred Dressing: 2 x 2 sterile gauze and Band-Aid    Post-procedure details: Patient was observed during the procedure. Post-procedure instructions were reviewed.  Patient left the clinic in stable condition.

## 2022-11-13 NOTE — Progress Notes (Signed)
Francis Jackson - 57 y.o. male MRN [id number]  Date of birth: [birth year]  Office Visit Note: Visit Date: 11/12/2022 PCP: Nolene Ebbs, MD Referred by: Callie Fielding, MD  Subjective: Chief Complaint  Patient presents with   Lower Back - Pain   HPI:  Francis Jackson is a 56 y.o. male who comes in today at the request of Dr. Ileene Rubens for planned Bilateral L4-5 Lumbar Transforaminal epidural steroid injection with fluoroscopic guidance.  The patient has failed conservative care including home exercise, medications, time and activity modification.  This injection will be diagnostic and hopefully therapeutic.  Please see requesting physician notes for further details and justification.    ROS Otherwise per HPI.  Assessment & Plan: Visit Diagnoses:    ICD-10-CM   1. Lumbar radiculopathy  M54.16 XR C-ARM NO REPORT    Epidural Steroid injection    methylPREDNISolone acetate (DEPO-MEDROL) injection 80 mg      Plan: No additional findings.   Meds & Orders:  Meds ordered this encounter  Medications   methylPREDNISolone acetate (DEPO-MEDROL) injection 80 mg    Orders Placed This Encounter  Procedures   XR C-ARM NO REPORT   Epidural Steroid injection    Follow-up: Return for visit to requesting provider as needed.   Procedures: No procedures performed  Lumbosacral Transforaminal Epidural Steroid Injection - Sub-Pedicular Approach with Fluoroscopic Guidance  Patient: Francis Jackson      Date of Birth: [birth year] MRN: [id number] PCP: Nolene Ebbs, MD      Visit Date: 11/12/2022   Universal Protocol:    Date/Time: 11/12/2022  Consent Given By: the patient  Position: PRONE  Additional Comments: Vital signs were monitored before and after the procedure. Patient was prepped and draped in the usual sterile fashion. The correct patient, procedure, and site was verified.   Injection Procedure Details:   Procedure diagnoses: Lumbar radiculopathy [M54.16]    Meds  Administered:  Meds ordered this encounter  Medications   methylPREDNISolone acetate (DEPO-MEDROL) injection 80 mg    Laterality: Bilateral  Location/Site: L4  Needle:5.0 in., 22 ga.  Short bevel or Quincke spinal needle  Needle Placement: Transforaminal  Findings:    -Comments: Excellent flow of contrast along the nerve, nerve root and into the epidural space.  Procedure Details: After squaring off the end-plates to get a true AP view, the C-arm was positioned so that an oblique view of the foramen as noted above was visualized. The target area is just inferior to the "nose of the scotty dog" or sub pedicular. The soft tissues overlying this structure were infiltrated with 2-3 ml. of 1% Lidocaine without Epinephrine.  The spinal needle was inserted toward the target using a "trajectory" view along the fluoroscope beam.  Under AP and lateral visualization, the needle was advanced so it did not puncture dura and was located close the 6 O'Clock position of the pedical in AP tracterory. Biplanar projections were used to confirm position. Aspiration was confirmed to be negative for CSF and/or blood. A 1-2 ml. volume of Isovue-250 was injected and flow of contrast was noted at each level. Radiographs were obtained for documentation purposes.   After attaining the desired flow of contrast documented above, a 0.5 to 1.0 ml test dose of 0.25% Marcaine was injected into each respective transforaminal space.  The patient was observed for 90 seconds post injection.  After no sensory deficits were reported, and normal lower extremity motor function was noted,   the above injectate was  administered so that equal amounts of the injectate were placed at each foramen (level) into the transforaminal epidural space.   Additional Comments:  No complications occurred Dressing: 2 x 2 sterile gauze and Band-Aid    Post-procedure details: Patient was observed during the procedure. Post-procedure  instructions were reviewed.  Patient left the clinic in stable condition.    Clinical History: No specialty comments available.     Objective:  VS:  HT:    WT:   BMI:     BP:(!) 146/90  HR:(!) 58bpm  TEMP: ( )  RESP:  Physical Exam Vitals and nursing note reviewed.  Constitutional:      General: He is not in acute distress.    Appearance: Normal appearance. He is not ill-appearing.  HENT:     Head: Normocephalic and atraumatic.     Right Ear: External ear normal.     Left Ear: External ear normal.     Nose: No congestion.  Eyes:     Extraocular Movements: Extraocular movements intact.  Cardiovascular:     Rate and Rhythm: Normal rate.     Pulses: Normal pulses.  Pulmonary:     Effort: Pulmonary effort is normal. No respiratory distress.  Abdominal:     General: There is no distension.     Palpations: Abdomen is soft.  Musculoskeletal:        General: No tenderness or signs of injury.     Cervical back: Neck supple.     Right lower leg: No edema.     Left lower leg: No edema.     Comments: Patient has good distal strength without clonus.  Skin:    Findings: No erythema or rash.  Neurological:     General: No focal deficit present.     Mental Status: He is alert and oriented to person, place, and time.     Sensory: No sensory deficit.     Motor: No weakness or abnormal muscle tone.     Coordination: Coordination normal.  Psychiatric:        Mood and Affect: Mood normal.        Behavior: Behavior normal.      Imaging: XR C-ARM NO REPORT  Result Date: 11/13/2022 Please see Notes tab for imaging impression.

## 2022-11-17 NOTE — Procedures (Signed)
   HISTORY: 57 year old male with history of complex partial seizure disorder, is taking Keppra,  TECHNIQUE:  This is a routine 16 channel EEG recording with one channel devoted to a limited EKG recording.  It was performed during wakefulness, drowsiness and asleep.  Hyperventilation and photic stimulation were performed as activating procedures.  There are minimum muscle and movement artifact noted.  Upon maximum arousal, posterior dominant waking rhythm consistent of rhythmic alpha range activity. Activities are symmetric over the bilateral posterior derivations and attenuated with eye opening.  There are occasional T8 centered sharp transient, with field spread to adjacent leads, F8, F4, C4, P4, Fp2,  Following hyperventilation and photic stimulation, drowsiness, there increased appearance of right hemisphere sharp transient.  There was no clinical seizure activity noted.  During EEG recording, patient developed drowsiness and no deeper stage of sleep was achieved,  During EEG recording, there was no epileptiform discharge noted.  EKG demonstrate normal sinus rhythm.  CONCLUSION: This is an abnormal awake EEG.  There is no electrodiagnostic evidence of sharp transient involving right hemisphere, centered at T8, spreading to adjacent leads, F8,  F4, C4, P4, suggesting focal irritability, he is at high risk for recurrent seizure.  Marcial Pacas, M.D. Ph.D.  [facility] [place], [place] [address] Phone: [phone] Fax:      [phone]

## 2022-11-29 ENCOUNTER — Other Ambulatory Visit: Payer: Self-pay | Admitting: Orthopedic Surgery

## 2022-12-07 ENCOUNTER — Emergency Department (HOSPITAL_COMMUNITY)
Admission: EM | Admit: 2022-12-07 | Discharge: 2022-12-08 | Disposition: A | Discharge status: Home / Self Care | Payer: Medicaid Other | Attending: Emergency Medicine | Admitting: Emergency Medicine

## 2022-12-07 ENCOUNTER — Emergency Department (HOSPITAL_COMMUNITY): Payer: Medicaid Other

## 2022-12-07 ENCOUNTER — Encounter (HOSPITAL_COMMUNITY): Payer: Self-pay

## 2022-12-07 DIAGNOSIS — R079 Chest pain, unspecified: Secondary | ICD-10-CM | POA: Insufficient documentation

## 2022-12-07 DIAGNOSIS — R6883 Chills (without fever): Secondary | ICD-10-CM | POA: Diagnosis not present

## 2022-12-07 DIAGNOSIS — Z1152 Encounter for screening for COVID-19: Secondary | ICD-10-CM | POA: Insufficient documentation

## 2022-12-07 DIAGNOSIS — R519 Headache, unspecified: Secondary | ICD-10-CM | POA: Diagnosis not present

## 2022-12-07 DIAGNOSIS — Z79899 Other long term (current) drug therapy: Secondary | ICD-10-CM | POA: Diagnosis not present

## 2022-12-07 DIAGNOSIS — M791 Myalgia, unspecified site: Secondary | ICD-10-CM | POA: Insufficient documentation

## 2022-12-07 DIAGNOSIS — N390 Urinary tract infection, site not specified: Secondary | ICD-10-CM | POA: Insufficient documentation

## 2022-12-07 DIAGNOSIS — R319 Hematuria, unspecified: Secondary | ICD-10-CM | POA: Insufficient documentation

## 2022-12-07 DIAGNOSIS — R778 Other specified abnormalities of plasma proteins: Secondary | ICD-10-CM | POA: Insufficient documentation

## 2022-12-07 DIAGNOSIS — R3915 Urgency of urination: Secondary | ICD-10-CM | POA: Diagnosis present

## 2022-12-07 LAB — CBC
HCT: 44.9 % (ref 39.0–52.0)
Hemoglobin: 14.5 g/dL (ref 13.0–17.0)
MCH: 26 pg (ref 26.0–34.0)
MCHC: 32.3 g/dL (ref 30.0–36.0)
MCV: 80.5 fL (ref 80.0–100.0)
Platelets: 239 10*3/uL (ref 150–400)
RBC: 5.58 MIL/uL (ref 4.22–5.81)
RDW: 15.3 % (ref 11.5–15.5)
WBC: 10.9 10*3/uL — ABNORMAL HIGH (ref 4.0–10.5)
nRBC: 0 % (ref 0.0–0.2)

## 2022-12-07 LAB — BASIC METABOLIC PANEL
Anion gap: 7 (ref 5–15)
BUN: 17 mg/dL (ref 6–20)
CO2: 23 mmol/L (ref 22–32)
Calcium: 9 mg/dL (ref 8.9–10.3)
Chloride: 102 mmol/L (ref 98–111)
Creatinine, Ser: 1.12 mg/dL (ref 0.61–1.24)
GFR, Estimated: >60 mL/min (ref >60)
Glucose, Bld: 176 mg/dL — ABNORMAL HIGH (ref 70–99)
Potassium: 3.6 mmol/L (ref 3.5–5.1)
Sodium: 132 mmol/L — ABNORMAL LOW (ref 135–145)

## 2022-12-07 LAB — URINALYSIS, ROUTINE W REFLEX MICROSCOPIC
Bilirubin Urine: NEGATIVE
Glucose, UA: NEGATIVE mg/dL
Ketones, ur: 5 mg/dL — AB
Nitrite: POSITIVE — AB
Protein, ur: 100 mg/dL — AB
Specific Gravity, Urine: 1.024 (ref 1.005–1.030)
WBC, UA: >50 WBC/hpf (ref 0–5)
pH: 5 (ref 5.0–8.0)

## 2022-12-07 LAB — RESP PANEL BY RT-PCR (RSV, FLU A&B, COVID)  RVPGX2
Influenza A by PCR: NEGATIVE
Influenza B by PCR: NEGATIVE
Resp Syncytial Virus by PCR: NEGATIVE
SARS Coronavirus 2 by RT PCR: NEGATIVE

## 2022-12-07 LAB — TROPONIN I (HIGH SENSITIVITY): Troponin I (High Sensitivity): 24 ng/L — ABNORMAL HIGH (ref <18)

## 2022-12-07 NOTE — ED Triage Notes (Signed)
Pt arrived POV for c/o bodyaches, chills, headache, CP, SOB, back pain, but denies fever. Pt was seen on Monday for prostate issues and started to feel bad on Tuesday. Pt reports still having issues with urination, feels needs to urinate, but trouble starting stream, pain/pressure to lower abd, hematuria yesterday. A&O x4, VSS.

## 2022-12-08 LAB — TROPONIN I (HIGH SENSITIVITY): Troponin I (High Sensitivity): 24 ng/L — ABNORMAL HIGH (ref <18)

## 2022-12-08 MED ORDER — CEFPODOXIME PROXETIL 200 MG PO TABS: 200.0000 mg | ORAL_TABLET | Freq: Two times a day (BID) | ORAL | 0 refills | Status: AC

## 2022-12-08 MED ORDER — SODIUM CHLORIDE 0.9 % IV SOLN
1.0000 g | Freq: Once | INTRAVENOUS | Status: AC
  Administered 2022-12-08: 1 g via INTRAVENOUS
  Filled 2022-12-08: qty 10

## 2022-12-08 NOTE — ED Provider Notes (Signed)
[facility] DEPT Provider Note: Georgena Spurling, MD, FACEP  CSN: [id number] MRN: [id number] ARRIVAL: 12/07/22 at 1927 ROOM: [place]  12/08/22 12:18 AM Francis Jackson is a 57 y.o. male with a history of enlarged prostate.  He was seen by urology 5 days ago where he underwent a cystoscopy.  He states the cystoscopy was painful.  The next day he developed urinary symptoms.  Specifically he has had urinary urgency, urinary frequency, hematuria, difficulty starting his stream, pressure in his lower abdomen, and pain with urination.  Since then he has also had a headache, chest pain, back pain and bodyaches.  He describes these as constant.  He has had chills but is not aware of having a fever.  He denies shortness of breath.   Past Medical History:  Diagnosis Date   Acute urinary retention 12/22/2011   BPH (benign prostatic hyperplasia) 08/24/2013   Gross hematuria 12/22/2011   History of kidney stones    History of urinary retention    12/2011   Migraine    Mild obstructive sleep apnea    PER STUDY 09-14-2009-uses CPAP   Seizure disorder, grand mal ([place])    PER PT LAST SEIZURE 01/2013   Seizures ([place])    Ureteral calculus 12/22/2011    Past Surgical History:  Procedure Laterality Date   CYSTO/  RIGHT RETROGRADE PYELOGRAM/  URETEROSCOPY  12-23-2011   EXCISION LIPOMA OF SCALP AND FACE  03-24-2005   INGUINAL HERNIA REPAIR Bilateral 10/18/2016   Procedure: LAPAROSCOPIC EXPLORATION AND REPAIR OF BILATERAL INGUINAL HERNIA WITH A TAPP REPAIR;  Surgeon: Michael Boston, MD;  Location: [facility];  Service: General;  Laterality: Bilateral;   INSERTION OF MESH Bilateral 10/18/2016   Procedure: INSERTION OF MESH;  Surgeon: Michael Boston, MD;  Location: [facility];  Service: General;  Laterality: Bilateral;   LAPAROSCOPIC LYSIS OF ADHESIONS  10/18/2016   Procedure: LAPAROSCOPIC LYSIS OF ADHESIONS;  Surgeon: Michael Boston, MD;  Location: [facility]  [facility];  Service: General;;   SHOULDER SURGERY Left 2009   TRANSURETHRAL RESECTION OF PROSTATE N/A 08/24/2013   Procedure: TRANSURETHRAL RESECTION OF THE PROSTATE WITH GYRUS INSTRUMENTS;  Surgeon: Bernestine Amass, MD;  Location: [facility];  Service: Urology;  Laterality: N/A;    History reviewed. No pertinent family history.  Social History   Tobacco Use   Smoking status: Never   Smokeless tobacco: Never  Vaping Use   Vaping Use: Never used  Substance Use Topics   Alcohol use: No    Alcohol/week: 0.0 standard drinks of alcohol   Drug use: No    Prior to Admission medications   Medication Sig Start Date End Date Taking? Authorizing Provider  cefpodoxime (VANTIN) 200 MG tablet Take 1 tablet (200 mg total) by mouth 2 (two) times daily for 10 days. 12/08/22 12/18/22 Yes Antonette Hendricks, MD  amLODipine (NORVASC) 5 MG tablet Take 5 mg by mouth daily. 09/20/22   [provider]  cyclobenzaprine (FLEXERIL) 10 MG tablet Take 1 tablet (10 mg total) by mouth 2 (two) times daily as needed for muscle spasms. 02/15/21   Charlann Lange, PA-C  levETIRAcetam (KEPPRA XR) 500 MG 24 hr tablet Take 4 tablets (2,000 mg total) by mouth at bedtime. 10/25/22   Marcial Pacas, MD  metoprolol succinate (TOPROL-XL) 50 MG 24 hr tablet Take 50 mg by mouth daily. 11/11/17   [provider]  tamsulosin (FLOMAX) 0.4 MG CAPS capsule  Take 0.4 mg by mouth daily.    [provider]  traMADol (ULTRAM) 50 MG tablet TAKE 1 TABLET BY MOUTH EVERY 6 HOURS AS NEEDED FOR UP TO 5 DAYS. 11/29/22   Callie Fielding, MD    Allergies Patient has no known allergies.   REVIEW OF SYSTEMS  Negative except as noted here or in the History of Present Illness.   PHYSICAL EXAMINATION  Initial Vital Signs Blood pressure 137/84, pulse 84, temperature 100.1 F (37.8 C), temperature source Oral, resp. rate 20, height 5\' 8"  (1.727 m), weight 90.7 kg, SpO2 100 %.  Examination General: Well-developed,  well-nourished male in no acute distress; appearance consistent with age of record HENT: normocephalic; atraumatic Eyes: pterygia Neck: supple Heart: regular rate and rhythm Lungs: clear to auscultation bilaterally Abdomen: soft; nondistended; nontender; bowel sounds present GU: Bilateral CVA tenderness Extremities: No deformity; full range of motion; pulses normal Neurologic: Awake, alert and oriented; motor function intact in all extremities and symmetric; no facial droop Skin: Warm and dry Psychiatric: Normal mood and affect   RESULTS  Summary of this visit's results, reviewed and interpreted by myself:   EKG Interpretation  Date/Time:  Saturday December 08 2022 00:13:43 EDT Ventricular Rate:  85 PR Interval:  171 QRS Duration: 84 QT Interval:  347 QTC Calculation: 413 R Axis:   30 Text Interpretation: Sinus rhythm Probable left atrial enlargement Minimal ST elevation, anterior leads Rate is faster Confirmed by Akina Maish, Jenny Reichmann [phone]) on 12/08/2022 12:17:33 AM       Laboratory Studies: Results for orders placed or performed during the hospital encounter of 12/07/22 (from the past 24 hour(s))  Basic metabolic panel     Status: Abnormal   Collection Time: 12/07/22  8:22 PM  Result Value Ref Range   Sodium 132 (L) 135 - 145 mmol/L   Potassium 3.6 3.5 - 5.1 mmol/L   Chloride 102 98 - 111 mmol/L   CO2 23 22 - 32 mmol/L   Glucose, Bld 176 (H) 70 - 99 mg/dL   BUN 17 6 - 20 mg/dL   Creatinine, Ser 1.12 0.61 - 1.24 mg/dL   Calcium 9.0 8.9 - 10.3 mg/dL   GFR, Estimated >60 >60 mL/min   Anion gap 7 5 - 15  CBC     Status: Abnormal   Collection Time: 12/07/22  8:22 PM  Result Value Ref Range   WBC 10.9 (H) 4.0 - 10.5 K/uL   RBC 5.58 4.22 - 5.81 MIL/uL   Hemoglobin 14.5 13.0 - 17.0 g/dL   HCT 44.9 39.0 - 52.0 %   MCV 80.5 80.0 - 100.0 fL   MCH 26.0 26.0 - 34.0 pg   MCHC 32.3 30.0 - 36.0 g/dL   RDW 15.3 11.5 - 15.5 %   Platelets 239 150 - 400 K/uL   nRBC 0.0 0.0 - 0.2 %   Troponin I (High Sensitivity)     Status: Abnormal   Collection Time: 12/07/22  8:22 PM  Result Value Ref Range   Troponin I (High Sensitivity) 24 (H) <18 ng/L  Resp panel by RT-PCR (RSV, Flu A&B, Covid) Anterior Nasal Swab     Status: None   Collection Time: 12/07/22  8:22 PM   Specimen: Anterior Nasal Swab  Result Value Ref Range   SARS Coronavirus 2 by RT PCR NEGATIVE NEGATIVE   Influenza A by PCR NEGATIVE NEGATIVE   Influenza B by PCR NEGATIVE NEGATIVE   Resp Syncytial Virus by PCR NEGATIVE NEGATIVE  Urinalysis, Routine w  reflex microscopic -Urine, Clean Catch     Status: Abnormal   Collection Time: 12/07/22  8:31 PM  Result Value Ref Range   Color, Urine AMBER (A) YELLOW   APPearance CLOUDY (A) CLEAR   Specific Gravity, Urine 1.024 1.005 - 1.030   pH 5.0 5.0 - 8.0   Glucose, UA NEGATIVE NEGATIVE mg/dL   Hgb urine dipstick MODERATE (A) NEGATIVE   Bilirubin Urine NEGATIVE NEGATIVE   Ketones, ur 5 (A) NEGATIVE mg/dL   Protein, ur 100 (A) NEGATIVE mg/dL   Nitrite POSITIVE (A) NEGATIVE   Leukocytes,Ua LARGE (A) NEGATIVE   RBC / HPF 21-50 0 - 5 RBC/hpf   WBC, UA >50 0 - 5 WBC/hpf   Bacteria, UA MANY (A) NONE SEEN   Squamous Epithelial / HPF 0-5 0 - 5 /HPF  Troponin I (High Sensitivity)     Status: Abnormal   Collection Time: 12/08/22 12:28 AM  Result Value Ref Range   Troponin I (High Sensitivity) 24 (H) <18 ng/L   Imaging Studies: DG Chest 2 View  Result Date: 12/07/2022 CLINICAL DATA:  Shortness of breath and chest pain EXAM: CHEST - 2 VIEW COMPARISON:  02/14/2021 FINDINGS: Borderline to mild cardiomegaly. No acute airspace disease or effusion. No pneumothorax IMPRESSION: No active cardiopulmonary disease. Borderline to mild cardiomegaly. Electronically Signed   By: Donavan Foil M.D.   On: 12/07/2022 20:42    ED COURSE and MDM  Nursing notes, initial and subsequent vitals signs, including pulse oximetry, reviewed and interpreted by myself.  Vitals:   12/07/22 2006  12/08/22 0130  BP: 137/84 135/84  Pulse: 84 82  Resp: 20 (!) 26  Temp: 100.1 F (37.8 C)   TempSrc: Oral   SpO2: 100% 98%  Weight: 90.7 kg   Height: 5\' 8"  (1.727 m)    Medications  cefTRIAXone (ROCEPHIN) 1 g in sodium chloride 0.9 % 100 mL IVPB (0 g Intravenous Stopped 12/08/22 0232)    12:46 AM No significant retained urine seen on bedside bladder scan.  Urinalysis is consistent with a urinary tract infection we will start Rocephin IV in the ED.  Urine has been sent for culture.  2:32 AM The patient's EKG is not showing any acute ischemic changes.  His chest pain has been constant and accompanied by body aches and urinary tract infection symptoms.  His troponins are slightly elevated at 24 but flat.  This elevation is only slight and I do not believe he is having an acute cardiac event.  I suspect this represents chronic elevation.  He has no cardiac history.  We will treat his urinary tract infection with antibiotics and refer back to urology.  Because of his bilateral flank tenderness we will treat for possible pyelonephritis.  PROCEDURES  Procedures   ED DIAGNOSES     ICD-10-CM   1. Urinary tract infection with hematuria, site unspecified  N39.0    R31.9          Haydn Hutsell, Jenny Reichmann, MD 12/08/22 0236

## 2022-12-10 LAB — URINE CULTURE: Culture: >=100000 — AB

## 2022-12-11 ENCOUNTER — Telehealth (HOSPITAL_BASED_OUTPATIENT_CLINIC_OR_DEPARTMENT_OTHER): Payer: Self-pay | Admitting: Emergency Medicine

## 2022-12-11 NOTE — Telephone Encounter (Signed)
Post ED Visit - Positive Culture Follow-up  Culture report reviewed by antimicrobial stewardship pharmacist: [facility] Team []  Elenor Quinones, Pharm.D. []  Heide Guile, Pharm.D., BCPS AQ-ID []  Parks Neptune, Pharm.D., BCPS []  Alycia Rossetti, Pharm.D., BCPS []  [place], Pharm.D., BCPS, AAHIVP []  Legrand Como, Pharm.D., BCPS, AAHIVP []  Salome Arnt, PharmD, BCPS []  Johnnette Gourd, PharmD, BCPS []  Hughes Better, PharmD, BCPS []  Leeroy Cha, PharmD []  Laqueta Linden, PharmD, BCPS []  Albertina Parr, PharmD  [facility] Team []  Leodis Sias, PharmD []  Lindell Spar, PharmD []  Royetta Asal, PharmD []  Graylin Shiver, Rph []  Rema Fendt) Glennon Mac, PharmD []  Arlyn Dunning, PharmD []  Netta Cedars, PharmD []  Dia Sitter, PharmD []  Leone Haven, PharmD []  Gretta Arab, PharmD []  Theodis Shove, PharmD []  Peggyann Juba, PharmD []  Reuel Boom, PharmD   Positive urine culture Treated with cefpodoxime, organism sensitive to the same and no further patient follow-up is required at this time.  Hazle Nordmann 12/11/2022, 8:58 AM

## 2023-01-10 ENCOUNTER — Ambulatory Visit: Payer: Medicaid Other | Admitting: Orthopedic Surgery

## 2023-01-10 DIAGNOSIS — M5416 Radiculopathy, lumbar region: Secondary | ICD-10-CM | POA: Diagnosis not present

## 2023-01-10 NOTE — Progress Notes (Signed)
Orthopedic Spine Surgery Office Note   Assessment: Patient is a 57 y.o. male with chronic, stable low back pain that radiates into his bilateral thighs consistent with neurogenic claudication. On MRI, has lumbar stenosis from L2-S1.      Plan: -At this point, he has tried all conservative treatments without any lasting relief so I discussed decompression surgery as an option for him.  He is not interested in surgery/that his alternative would be to go to pain management.  A new referral was provided to him since he said he never got a notification about setting up an appointment to be seen at [facility] -Told him that if he changes his mind about surgery, that would still be an option for him -Patient should return to office on an as needed basis     Patient expressed understanding of the plan and all questions were answered to the patient's satisfaction.    ___________________________________________________________________________     History:   Patient is a 57 y.o. male who presents today for lumbar spine.  Patient has been previously seen in the office for his low back pain that radiates into his bilateral thighs.  He states that he got an injection a couple months ago with Dr. Alvester Morin and that did provide relief of his leg pain.  He is still having significant back pain.  He feels the pain on a daily basis.  He is interested in a repeat injection.      Treatments tried: Tramadol, steroid injections, activity modification, Tylenol    Physical Exam:   General: no acute distress, appears stated age Neurologic: alert, answering questions appropriately, following commands Respiratory: unlabored breathing on room air, symmetric chest rise Psychiatric: appropriate affect, normal cadence to speech     MSK (spine):   -Strength exam                                                   Left                  Right EHL                              5/5                  5/5 TA                                  5/5                  5/5 GSC                             5/5                  5/5 Knee extension            5/5                  5/5 Hip flexion                    5/5                  5/5   -  Sensory exam                           Sensation intact to light touch in L3-S1 nerve distributions of bilateral lower extremities   Imaging: XR of the lumbar spine from 10/25/2022 was previously independently reviewed and interpreted, showing no fracture or dislocation. Disc height loss at L3/4, L4/5, L5/S1. No evidence of instability on flexion/extension views.    MRI of the lumbar spine from 02/18/2021 was previously independently reviewed and interpreted, showing central and lateral recess stenosis at L2/3, L3/4, L4/5.  Lateral recess stenosis at L5/S1. Foraminal stenosis L3/4, L4/5, L5/S1 bilaterally.      Patient name: Francis Jackson Patient MRN: [id number] Date of visit: 01/10/23

## 2023-01-23 ENCOUNTER — Ambulatory Visit: Payer: Medicaid Other | Admitting: Family Medicine

## 2023-03-27 ENCOUNTER — Ambulatory Visit: Payer: Medicaid Other | Admitting: Family Medicine

## 2023-03-27 NOTE — Progress Notes (Deleted)
PATIENT: Francis Jackson DOB: 04-01-1966  REASON FOR VISIT: follow up HISTORY FROM: patient  No chief complaint on file.    HISTORY: (copied from my note on 01/12/2020)   HISTORY Francis Jackson is all 57 years old right-handed [place] American male, native of [place], return to clinic for followup seizure, last clinical visit was May 2013 with [name]   He began to have seizure since August 05 2009, it was nocturnal seizure, generalized tonic-clonic lasting about 10-15 minutes.   Evaluation showed normal MRI of the brain, EEG, laboratory showed normal CBC, CMP, UA, negative UDS.   Second nocturnal seizure July 2011, third seizure in February 2012, he was visiting his friend with his wife, he began to circling around, confused, and then went to tonic-clonic , he was started on Keppra 500 mg twice a day in 2012,   4 seizure was in June 2013, while he was not compliant with his Keppra, he was taken to emergency room, had multiple abrasions  to his face, and right eye, CAT scan of the brain and neck showed no significant abnormality.   Fifths seizure in May 2014, again happened when he was not compliant with his Keppra. Proceeding by rising sensation, nauseous,   He also complains of symptoms consistent with obstructive sleep apnea, had sleep study in January 2011, at [facility], by Dr. Griffin Basil, there was evidence of mild obstructive sleep apnea, lowest SpO2 was 82%, he was given advise of losing weight, sleep on his side, he continued to have frequent snoring, wife has to shake him to stop snoring, start breathing almost every night   UPDATE December 19 2016: [name] clinical visit was on December 20 2015, as the seizure was in July 10 2014 after he misses his Keppra dose, is now taking Keppra 500 mg twice a day.   He has been compliant with his CPAP machine, which help him sleep better   UPDATE April 11th,  2019CM Mr. Francis Jackson 57 year old male returns for follow-up, with history of seizure disorder.   Last seizure occurred in 2015.  He is currently on Keppra XR 500 mg 2 tablets at bedtime.  He denies missing any doses.  CPAP compliance dated 11/17/2017- 12/16/2017 shows compliance greater than 4 hours for 60% for 18 days.  Average usage 4 hours 52 minutes.  Set pressure 7 cm.  EPR level 1.  AHI 7.7 ESS 5.  Patient reports he does not feel he is getting enough pressure from his CPAP.  FSS 16.  He returns for reevaluation   Today 04/14/18 Mr. [name] is a 57 year old male with a history of obstructive sleep apnea on CPAP as well as seizures.  His CPAP download indicates that he uses machine 25 out of 90 days for compliance of 83%.  He uses machine greater than 4 hours 51 out of 90 days for compliance of 57%.  On average he uses his machine 5 hours.  His residual AHI is 3.7 on 8 cm of water with EPR of 1.  The patient states that sometimes he does not go to bed till after 1 AM.  Patient repots that he does not use the machine during the day when he naps.  He remains on Keppra.  Denies any seizure events.  He returns today for evaluation.   UPDATE 02/16/19 ALL Francis Jackson is a 57 y.o. male here today for follow up of OSA on CPAP.  He is doing very well with CPAP therapy.  Download  report dated 01/13/2019 through 02/11/2019 reveals that he is used his CPAP 29 out of the last 30 days for compliance of 97%.  29 of the days he used his machine for greater than 4 hours for compliance of 97%.  Average usage was 6 hours and 22 minutes.  AHI was 4.7 on 8 cm of water with an EPR of 1.  There is no significant leak.  He states that his machine is older and is requesting a replacement CPAP.  He has reached out to his DME provider who asked for updated follow-up.  Otherwise he is doing very well and notes significant benefit with CPAP therapy.  He also continues with Keppra 1000 mg every night.  He is tolerating medication well.  He denies any seizure activity.   UPDATE 01/12/2020 ALL Francis Jackson is a 57 y.o. male here  today for follow up of OSA on CPAP and seizure. He admits that he has gotten out of the habit of using CPAP. He denies any concerns with therapy. He wishes to resume daily therapy.   Compliance report dated 12/12/2019 through 01/10/2020 reveals that he has used CPAP therapy 9 of the past 30 days for compliance of 30%.  He did use CPAP greater than 4 hours those 9 days for compliance of 30%.  Average use on days used was 5 hours and 13 minutes.  Residual AHI was 4.0 on 8 cm of water.  There was no leak noted.  Review of 90-day compliance report dated 10/13/2019 through 01/10/2020 reveals daily compliance of 60% and 4-hour compliance of 52%.  He continues levetiracetam XR 1,000mg  daily. No recent seizure activity. He is feeling well and without concerns today.   UPDATE 07/19/2020 ALL:  Francis Jackson is a 57 y.o. male here today for follow up for OSA on CPAP and seizures. He continues levetiracetam XR 1000mg  for seizure management and gabapentin 300mg  daily as needed for neck pain. No seizure activity since 2015.   Compliance report dated 06/18/2020-07/17/2020 reveals a use CPAP 27 of the past 30 days for compliance of 90%.  The CPAP greater than 4 hours 14 of the past 30 days for compliance of 47%.  Average usage on days used was 4 hours and 18 minutes.  Residual AHI was 4.5 on a set pressure of 8 cm of water.  There was no significant leak noted. 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 He does feel sleepy during the daytime but feels CPAP therapy has helped significantly.   UPDATE 01/17/2022 ALL Francis Jackson is a 57 y.o. male here today for follow up for OSA on CPAP and seizures. He was last seen by Dr Terrace Arabia 07/20/2021 and reported a breakthrough seizure. He was advised to increase levetiracetam XR to 1500mg  for seizure management. No longer taking gabapentin for neck pain. No seizure activity since. He has resumed driving. He works for [employer]. He typically works from 4-10am then  again from 4p-8p.   He continues CPAP therapy nightly for about 4-5 hours. He has continued to note benefit with CPAP. He has less daytime sleepiness. He denies sleepiness with driving.     Update 03/27/2023 ALL: Jakylan returns for follow up for OSA on CPAP and seizures. He was last seen by Dr Terrace Arabia 10/2022 and reported breakthrough seizure 07/2022. Felicity Coyer was increased to 2000mg  daily. EEG showed sharp transient involving right hemisphere suggesting focal irritability. High risk for recurrent seizure.  Since,   He continues CPAP therapy most every night for about 4-5 hours.     REVIEW OF SYSTEMS: Out of a complete 14 system review of symptoms, the patient complains only of the following symptoms, none and all other reviewed systems are negative.  ESS: 2  ALLERGIES: No Known Allergies  HOME MEDICATIONS: Outpatient Medications Prior to Visit  Medication Sig Dispense Refill   amLODipine (NORVASC) 5 MG tablet Take 5 mg by mouth daily.     cyclobenzaprine (FLEXERIL) 10 MG tablet Take 1 tablet (10 mg total) by mouth 2 (two) times daily as needed for muscle spasms. 20 tablet 0   levETIRAcetam (KEPPRA XR) 500 MG 24 hr tablet Take 4 tablets (2,000 mg total) by mouth at bedtime. 360 tablet 3   metoprolol succinate (TOPROL-XL) 50 MG 24 hr tablet Take 50 mg by mouth daily.  5   tamsulosin (FLOMAX) 0.4 MG CAPS capsule Take 0.4 mg by mouth daily.     traMADol (ULTRAM) 50 MG tablet TAKE 1 TABLET BY MOUTH EVERY 6 HOURS AS NEEDED FOR UP TO 5 DAYS. 20 tablet 0   No facility-administered medications prior to visit.    PAST MEDICAL HISTORY: Past Medical History:  Diagnosis Date   Acute urinary retention 12/22/2011   BPH (benign prostatic hyperplasia) 08/24/2013   Gross hematuria 12/22/2011   History of kidney stones    History of urinary retention    12/2011   Migraine    Mild obstructive sleep apnea    PER STUDY 09-14-2009-uses CPAP   Seizure disorder, grand mal (HCC)    PER PT LAST SEIZURE 01/2013    Seizures (HCC)    Ureteral calculus 12/22/2011    PAST SURGICAL HISTORY: Past Surgical History:  Procedure Laterality Date   CYSTO/  RIGHT RETROGRADE PYELOGRAM/  URETEROSCOPY  12-23-2011   EXCISION LIPOMA OF SCALP AND FACE  03-24-2005   INGUINAL HERNIA REPAIR Bilateral 10/18/2016   Procedure: LAPAROSCOPIC EXPLORATION AND REPAIR OF BILATERAL INGUINAL HERNIA WITH A TAPP REPAIR;  Surgeon: Karie Soda, MD;  Location: [facility];  Service: General;  Laterality: Bilateral;   INSERTION OF MESH Bilateral 10/18/2016   Procedure: INSERTION OF MESH;  Surgeon: Karie Soda, MD;  Location: [facility];  Service: General;  Laterality: Bilateral;   LAPAROSCOPIC LYSIS OF ADHESIONS  10/18/2016   Procedure: LAPAROSCOPIC LYSIS OF ADHESIONS;  Surgeon: Karie Soda, MD;  Location: [facility];  Service: General;;   SHOULDER SURGERY Left 2009   TRANSURETHRAL RESECTION OF PROSTATE N/A 08/24/2013   Procedure: TRANSURETHRAL RESECTION OF THE PROSTATE WITH GYRUS INSTRUMENTS;  Surgeon: Valetta Fuller, MD;  Location: [facility];  Service: Urology;  Laterality: N/A;    FAMILY HISTORY: No family history on file.  SOCIAL HISTORY: Social History   Socioeconomic History   Marital status: Married    Spouse name: Not on file   Number of children: 4   Years of education: 12   Highest education level: Not on file  Occupational History   Occupation: Taxi driver  Tobacco Use   Smoking status: Never   Smokeless tobacco: Never  Vaping Use   Vaping status: Never Used  Substance and Sexual Activity   Alcohol use: No    Alcohol/week: 0.0 standard drinks of alcohol   Drug use: No   Sexual activity: Not on file  Other Topics Concern   Not on file  Social History Narrative   Patient lives at home with his wife Rande Brunt)   Patient works as  a Media planner part time.   Right handed   Patient drinks one cup of tea per week.   Patient has four children.      Originally [place]   Social  Determinants of Health   Financial Resource Strain: Not on file  Food Insecurity: Not on file  Transportation Needs: Not on file  Physical Activity: Not on file  Stress: Not on file  Social Connections: Not on file  Intimate Partner Violence: Not on file     PHYSICAL EXAM  There were no vitals filed for this visit.   There is no height or weight on file to calculate BMI.  Generalized: Well developed, in no acute distress  Cardiology: normal rate and rhythm, no murmur noted Respiratory: clear to auscultation bilaterally  Neurological examination  Mentation: Alert oriented to time, place, history taking. Follows all commands speech and language fluent Cranial nerve II-XII: Pupils were equal round reactive to light. Extraocular movements were full, visual field were full  Motor: The motor testing reveals 5 over 5 strength of all 4 extremities. Good symmetric motor tone is noted throughout.  Gait and station: Gait is normal.    DIAGNOSTIC DATA (LABS, IMAGING, TESTING) - I reviewed patient records, labs, notes, testing and imaging myself where available.      No data to display           Lab Results  Component Value Date   WBC 10.9 (H) 12/07/2022   HGB 14.5 12/07/2022   HCT 44.9 12/07/2022   MCV 80.5 12/07/2022   PLT 239 12/07/2022      Component Value Date/Time   NA 132 (L) 12/07/2022 2022   NA 141 10/16/2022 1148   K 3.6 12/07/2022 2022   CL 102 12/07/2022 2022   CO2 23 12/07/2022 2022   GLUCOSE 176 (H) 12/07/2022 2022   BUN 17 12/07/2022 2022   BUN 11 10/16/2022 1148   CREATININE 1.12 12/07/2022 2022   CREATININE 1.12 05/29/2022 0000   CALCIUM 9.0 12/07/2022 2022   PROT 6.9 10/16/2022 1148   ALBUMIN 4.5 10/16/2022 1148   AST 24 10/16/2022 1148   ALT 23 10/16/2022 1148   ALKPHOS 75 10/16/2022 1148   BILITOT 0.4 10/16/2022 1148   GFRNONAA >60 12/07/2022 2022   GFRAA >60 10/09/2016 0947   Lab Results  Component Value Date   CHOL 180 05/29/2022   HDL  39 (L) 05/29/2022   LDLCALC 121 (H) 05/29/2022   TRIG 95 05/29/2022   CHOLHDL 4.6 05/29/2022   Lab Results  Component Value Date   HGBA1C 6.8 (H) 10/16/2022   Lab Results  Component Value Date   VITAMINB12 684 07/03/2022   Lab Results  Component Value Date   TSH 0.59 05/29/2022     ASSESSMENT AND PLAN 57 y.o. year old male  has a past medical history of Acute urinary retention (12/22/2011), BPH (benign prostatic hyperplasia) (08/24/2013), Gross hematuria (12/22/2011), History of kidney stones, History of urinary retention, Migraine, Mild obstructive sleep apnea, Seizure disorder, grand mal (HCC), Seizures (HCC), and Ureteral calculus (12/22/2011). here with   No diagnosis found.    Rodric S Hubbs is doing well on CPAP therapy. Daily compliance at goal with sub optimal 4 hour compliance. He was encouraged to continue using CPAP nightly and for greater than 4 hours each night. We will update supply orders as indicated. Risks of untreated sleep apnea review and education materials provided. He will continue levetiracetam XR 1500mg  daily. Seizure precautions  reviewed. Healthy lifestyle habits encouraged. He will follow up in 1 year, sooner if needed. He verbalizes understanding and agreement with this plan.    No orders of the defined types were placed in this encounter.    No orders of the defined types were placed in this encounter.     Shawnie Dapper, FNP-C 03/27/2023, 7:20 AM [facility] [address] [place], [place] [address] [phone]

## 2023-04-10 ENCOUNTER — Encounter: Payer: Self-pay | Admitting: Family Medicine

## 2023-04-10 ENCOUNTER — Telehealth: Payer: Self-pay | Admitting: Family Medicine

## 2023-04-10 NOTE — Telephone Encounter (Signed)
LVM and sent letter in mail informing pt of need to reschedule 05/29/23 appt - NP out

## 2023-05-29 ENCOUNTER — Ambulatory Visit: Payer: Medicaid Other | Admitting: Family Medicine

## 2023-06-13 ENCOUNTER — Other Ambulatory Visit (HOSPITAL_COMMUNITY): Payer: Self-pay | Admitting: Internal Medicine

## 2023-06-13 ENCOUNTER — Encounter (HOSPITAL_COMMUNITY): Payer: Self-pay

## 2023-06-13 ENCOUNTER — Other Ambulatory Visit (HOSPITAL_COMMUNITY): Payer: Self-pay

## 2023-06-13 ENCOUNTER — Ambulatory Visit (HOSPITAL_COMMUNITY)
Admission: RE | Admit: 2023-06-13 | Discharge: 2023-06-13 | Disposition: A | Discharge status: Home / Self Care | Payer: Medicaid Other | Source: Ambulatory Visit | Attending: Vascular Surgery | Admitting: Vascular Surgery

## 2023-06-13 VITALS — BP 160/114 | HR 58

## 2023-06-13 DIAGNOSIS — I82411 Acute embolism and thrombosis of right femoral vein: Secondary | ICD-10-CM | POA: Insufficient documentation

## 2023-06-13 DIAGNOSIS — R52 Pain, unspecified: Secondary | ICD-10-CM | POA: Diagnosis present

## 2023-06-13 MED ORDER — APIXABAN (ELIQUIS) VTE STARTER PACK (10MG AND 5MG)
ORAL_TABLET | ORAL | 0 refills | Status: DC
  Filled 2023-06-13: qty 74, 30d supply, fill #0

## 2023-06-13 NOTE — Progress Notes (Signed)
DVT Clinic Note  Name: Francis Jackson     MRN: [id number]     DOB: 20-Apr-1966     Sex: male  PCP: Fleet Contras, MD  Today's Visit: Visit Information: Initial Visit  Referred to DVT Clinic by: Primary Care - Dr. Marliss Coots Referred to CPP by: Dr. Hetty Blend Reason for referral:  Chief Complaint  Patient presents with   DVT   HISTORY OF PRESENT ILLNESS: Francis Jackson is a 57 y.o. male with PMH BPH, HTN, chronic low back pain, who presents after diagnosis of DVT for medication management. He developed right lower leg pain last week and swelling in his foot this week. He saw his PCP yesterday and had an ultrasound today which showed acute DVT extending from the right common femoral vein through the calf veins. No CP or SOB. He has no prior history of VTE. 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 He takes his medications on an empty stomach.   Positive Thrombotic Risk Factors: Sedentary journey lasting >8 hours within 4 weeks Bleeding Risk Factors: None Present  Negative Thrombotic Risk Factors: Previous VTE, Recent surgery (within 3 months), Recent trauma (within 3 months), Recent admission to hospital with acute illness (within 3 months), Paralysis, paresis, or recent plaster cast immobilization of lower extremity, Central venous catheterization, Bed rest >72 hours within 3 months, Pregnancy, Within 6 weeks postpartum, Recent cesarean section (within 3 months), Estrogen therapy, Testosterone therapy, Erythropoiesis-stimulating agent, Recent COVID diagnosis (within 3 months), Active cancer, Non-malignant, chronic inflammatory condition, Known thrombophilic condition, Smoking, Obesity, Older age  Rx Insurance Coverage: Medicaid Rx Affordability: Eliquis or Xarelto are both $4 per 30 or 90 day supply Preferred Pharmacy: Filled starter pack at [facility] during visit today. He prefers to get his refills at his next visit here in DVT Clinic. Will arrange for TOC to  fill during that visit.  Past Medical History:  Diagnosis Date   Acute urinary retention 12/22/2011   BPH (benign prostatic hyperplasia) 08/24/2013   Gross hematuria 12/22/2011   History of kidney stones    History of urinary retention    12/2011   Migraine    Mild obstructive sleep apnea    PER STUDY 09-14-2009-uses CPAP   Seizure disorder, grand mal (HCC)    PER PT LAST SEIZURE 01/2013   Seizures (HCC)    Ureteral calculus 12/22/2011    Past Surgical History:  Procedure Laterality Date   CYSTO/  RIGHT RETROGRADE PYELOGRAM/  URETEROSCOPY  12-23-2011   EXCISION LIPOMA OF SCALP AND FACE  03-24-2005   INGUINAL HERNIA REPAIR Bilateral 10/18/2016   Procedure: LAPAROSCOPIC EXPLORATION AND REPAIR OF BILATERAL INGUINAL HERNIA WITH A TAPP REPAIR;  Surgeon: Karie Soda, MD;  Location: [facility];  Service: General;  Laterality: Bilateral;   INSERTION OF MESH Bilateral 10/18/2016   Procedure: INSERTION OF MESH;  Surgeon: Karie Soda, MD;  Location: [facility];  Service: General;  Laterality: Bilateral;   LAPAROSCOPIC LYSIS OF ADHESIONS  10/18/2016   Procedure: LAPAROSCOPIC LYSIS OF ADHESIONS;  Surgeon: Karie Soda, MD;  Location: [facility];  Service: General;;   SHOULDER SURGERY Left 2009   TRANSURETHRAL RESECTION OF PROSTATE N/A 08/24/2013   Procedure: TRANSURETHRAL RESECTION OF THE PROSTATE WITH GYRUS INSTRUMENTS;  Surgeon: Valetta Fuller, MD;  Location: [facility];  Service: Urology;  Laterality: N/A;    Social History   Socioeconomic History   Marital status: Married    Spouse name: Not on file  Number of children: 4   Years of education: 12   Highest education level: Not on file  Occupational History   Occupation: Taxi driver  Tobacco Use   Smoking status: Never   Smokeless tobacco: Never  Vaping Use   Vaping status: Never Used  Substance and Sexual Activity   Alcohol use: No    Alcohol/week: 0.0 standard drinks of alcohol   Drug use: No   Sexual activity: Not on file   Other Topics Concern   Not on file  Social History Narrative   Patient lives at home with his wife Rande Brunt)   Patient works as a Media planner part time.   Right handed   Patient drinks one cup of tea per week.   Patient has four children.      Originally [place] in [place]   Social Determinants of Health   Financial Resource Strain: Not on file  Food Insecurity: Not on file  Transportation Needs: Not on file  Physical Activity: Not on file  Stress: Not on file  Social Connections: Not on file  Intimate Partner Violence: Not on file    No family history on file.  Allergies as of 06/13/2023   (No Known Allergies)    Current Outpatient Medications on File Prior to Encounter  Medication Sig Dispense Refill   amLODipine (NORVASC) 5 MG tablet Take 5 mg by mouth daily.     doxycycline (VIBRA-TABS) 100 MG tablet Take 100 mg by mouth 2 (two) times daily.     finasteride (PROSCAR) 5 MG tablet Take 5 mg by mouth daily.     HYDROcodone-acetaminophen (NORCO) 7.5-325 MG tablet Take 1 tablet by mouth every 6 (six) hours as needed.     levETIRAcetam (KEPPRA XR) 500 MG 24 hr tablet Take 4 tablets (2,000 mg total) by mouth at bedtime. 360 tablet 3   LINZESS 72 MCG capsule Take 72 mcg by mouth daily.     meloxicam (MOBIC) 15 MG tablet Take 15 mg by mouth daily.     metoprolol succinate (TOPROL-XL) 50 MG 24 hr tablet Take 50 mg by mouth daily.  5   tamsulosin (FLOMAX) 0.4 MG CAPS capsule Take 0.4 mg by mouth daily.     Vitamin D, Ergocalciferol, (DRISDOL) 1.25 MG (50000 UNIT) CAPS capsule Take 50,000 Units by mouth once a week.     No current facility-administered medications on file prior to encounter.   REVIEW OF SYSTEMS:  Review of Systems  Respiratory:  Negative for shortness of breath.   Cardiovascular:  Positive for leg swelling. Negative for chest pain and palpitations.  Musculoskeletal:  Positive for myalgias.  Neurological:  Positive for tingling. Negative for  dizziness.   PHYSICAL EXAMINATION:  Vitals:   06/13/23 1309  BP: (!) 160/114  Pulse: (!) 58  SpO2: 100%   Physical Exam Vitals reviewed.  Cardiovascular:     Rate and Rhythm: Normal rate.  Pulmonary:     Effort: Pulmonary effort is normal.  Musculoskeletal:        General: Tenderness present.     Right lower leg: Edema (2+) present.     Left lower leg: No edema.  Skin:    Findings: No bruising or erythema.  Psychiatric:        Mood and Affect: Mood normal.        Behavior: Behavior normal.        Thought Content: Thought content normal.   Villalta Score for Post-Thrombotic Syndrome: Pain: Moderate Cramps: Severe  Heaviness: Moderate Paresthesia: Moderate Pruritus: Absent Pretibial Edema: Moderate Skin Induration: Absent Hyperpigmentation: Absent Redness: Absent Venous Ectasia: Absent Pain on calf compression: Mild Villalta Preliminary Score: 12 Is venous ulcer present?: No If venous ulcer is present and score is <15, then 15 points total are assigned: Absent Villalta Total Score: 12  LABS:  CBC     Component Value Date/Time   WBC 10.9 (H) 12/07/2022 2022   RBC 5.58 12/07/2022 2022   HGB 14.5 12/07/2022 2022   HGB 14.3 10/16/2022 1148   HCT 44.9 12/07/2022 2022   HCT 45.1 10/16/2022 1148   PLT 239 12/07/2022 2022   PLT 293 10/16/2022 1148   MCV 80.5 12/07/2022 2022   MCV 80 10/16/2022 1148   MCH 26.0 12/07/2022 2022   MCHC 32.3 12/07/2022 2022   RDW 15.3 12/07/2022 2022   RDW 14.4 10/16/2022 1148   LYMPHSABS 1.8 10/16/2022 1148   MONOABS 0.8 03/22/2022 2155   EOSABS 0.1 10/16/2022 1148   BASOSABS 0.0 10/16/2022 1148    Hepatic Function      Component Value Date/Time   PROT 6.9 10/16/2022 1148   ALBUMIN 4.5 10/16/2022 1148   AST 24 10/16/2022 1148   ALT 23 10/16/2022 1148   ALKPHOS 75 10/16/2022 1148   BILITOT 0.4 10/16/2022 1148    Renal Function   Lab Results  Component Value Date   CREATININE 1.12 12/07/2022   CREATININE 1.08 10/16/2022    CREATININE 1.12 05/29/2022    CrCl cannot be calculated (Patient's most recent lab result is older than the maximum 21 days allowed.).   VVS Vascular Lab Studies:  06/13/23 VAS [place] LOWER EXTREMITY VENOUS (DVT)RIGHT Summary:  RIGHT:  - Findings consistent with acute deep vein thrombosis involving the right  common femoral vein, right femoral vein, right proximal profunda vein,  right popliteal vein, right posterior tibial veins, and right peroneal  veins. - No cystic structure found in the popliteal fossa.    LEFT:  - No evidence of common femoral vein obstruction.   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   Patient with acute extensive RLE DVT. Given involvement of the common femoral vein, discussed the patient with Dr. Hetty Blend. Since the DVT is only partially occlusive at the common femoral vein and there are no significant symptoms of pain or swelling above the knee, no need for vascular surgery intervention at this time. Will medically manage with anticoagulation. Only risk factor present is patient's occupation as a Patent attorney. He has two ~5 hour spans during the day that he drives and currently only gets out of the car for bathroom breaks. Will treat with a 3 month course of anticoagulation and work on the patient getting up and moving more throughout the day. If he were to develop another DVT in the future, would need to consider long-term anticoagulation.   Discussed medication options with the patient. As he takes his medications on an empty stomach, we will start Eliquis as efficacy is not affected by taking it with or without food. He feels that taking it twice a day will not be a problem. Extensively counseled the patient on the medication. Discussed interaction with NSAIDs  and to try to use meloxicam sparingly. No medication access or adherence issues at this time. There does seem to  be some confusion present about his medications in general. Will bring him back in a month to review adherence and confirm his symptoms are improving. He would like to get his remaining two month supply of Eliquis during that visit, which we can coordinate.   PLAN: -Start apixaban (Eliquis) 10 mg twice daily for 7 days followed by 5 mg twice daily. -Expected duration of therapy: 3 months. Therapy started on 06/13/23. -Patient educated on purpose, proper use and potential adverse effects of apixaban (Eliquis). -Discussed importance of taking medication around the same time every day. -Advised patient of medications to avoid (NSAIDs, aspirin doses >100 mg daily). -Educated that Tylenol (acetaminophen) is the preferred analgesic to lower the risk of bleeding. -Advised patient to alert all providers of anticoagulation therapy prior to starting a new medication or having a procedure. -Emphasized importance of monitoring for signs and symptoms of bleeding (abnormal bruising, prolonged bleeding, nose bleeds, bleeding from gums, discolored urine, black tarry stools). -Educated patient to present to the ED if emergent signs and symptoms of new thrombosis occur. -Counseled patient to wear compression stockings daily, removing at night. Elevate legs daily to help with swelling.   Follow up: 1 month in DVT Clinic for adherence check and for refills.  Pervis Hocking, PharmD, [place]y [place], CPP Deep [facility]in [facility]rombosis [facility] Clinical Pharmacist Practitioner Office: [phone]

## 2023-06-13 NOTE — Patient Instructions (Signed)
-  Start apixaban (Eliquis) 10 mg twice daily for 7 days followed by 5 mg twice daily. -We will get you your refills to you at your next appointment.  -It is important to take your medication around the same time every day.  -Avoid NSAIDs like ibuprofen (Advil, Motrin) and naproxen (Aleve) as well as aspirin doses over 100 mg daily. -Tylenol (acetaminophen) is the preferred over the counter pain medication to lower the risk of bleeding. -Be sure to alert all of your health care providers that you are taking an anticoagulant prior to starting a new medication or having a procedure. -Monitor for signs and symptoms of bleeding (abnormal bruising, prolonged bleeding, nose bleeds, bleeding from gums, discolored urine, black tarry stools). If you have fallen and hit your head OR if your bleeding is severe or not stopping, seek emergency care.  -Go to the emergency room if emergent signs and symptoms of new clot occur (new or worse swelling and pain in an arm or leg, shortness of breath, chest pain, fast or irregular heartbeats, lightheadedness, dizziness, fainting, coughing up blood) or if you experience a significant color change (pale or blue) in the extremity that has the DVT.  -We recommend you wear compression stockings (20-30 mmHg) as long as you are having swelling or pain. Be sure to purchase the correct size and take them off at night. Elevate   Your next visit is on Friday, November 1st, at 1:30 PM.  [facility] [address] [place], [place], [place] [address] Enter the hospital through Entrance C off [facility] and pull up to the [facility] entrance to the free valet parking.  Check in for your appointment at the [facility] Vascular Center.   If you have any questions or need to reschedule an appointment, please call [phone] [facility].  If you are having an emergency, call 911 or present to the nearest emergency room.   What is a DVT?  -Deep vein  thrombosis (DVT) is a condition in which a blood clot forms in a vein of the deep venous system which can occur in the lower leg, thigh, pelvis, arm, or neck. This condition is serious and can be life-threatening if the clot travels to the arteries of the lungs and causing a blockage (pulmonary embolism, PE). A DVT can also damage veins in the leg, which can lead to long-term venous disease, leg pain, swelling, discoloration, and ulcers or sores (post-thrombotic syndrome).  -Treatment may include taking an anticoagulant medication to prevent more clots from forming and the current clot from growing, wearing compression stockings, and/or surgical procedures to remove or dissolve the clot.

## 2023-07-03 NOTE — Patient Instructions (Signed)
Below is our plan:  We will ***  Please make sure you are consistent with timing of seizure medication. I recommend annual visit with primary care provider (PCP) for complete physical and routine blood work. I recommend daily intake of vitamin D (400-800iu) and calcium (800-1000mg ) for bone health. Discuss Dexa screening with PCP.   According to [place] law, you can not drive unless you are seizure / syncope free for at least 6 months and under physician's care.  Please maintain precautions. Do not participate in activities where a loss of awareness could harm you or someone else. No swimming alone, no tub bathing, no hot tubs, no driving, no operating motorized vehicles (cars, ATVs, motocycles, etc), lawnmowers, power tools or firearms. No standing at heights, such as rooftops, ladders or stairs. Avoid hot objects such as stoves, heaters, open fires. Wear a helmet when riding a bicycle, scooter, skateboard, etc. and avoid areas of traffic. Set your water heater to 120 degrees or less.  SUDEP is the sudden, unexpected death of someone with epilepsy, who was otherwise healthy. In SUDEP cases, no other cause of death is found when an autopsy is done. Each year, more than 1 in 1,000 people with epilepsy die from SUDEP. This is the leading cause of death in people with uncontrolled seizures. Until further answers are available, the best way to prevent SUDEP is to lower your risk by controlling seizures. Research has found that people with all types of epilepsy that experience convulsive seizures can be at risk.  Please continue using your CPAP regularly. While your insurance requires that you use CPAP at least 4 hours each night on 70% of the nights, I recommend, that you not skip any nights and use it throughout the night if you can. Getting used to CPAP and staying with the treatment long term does take time and patience and discipline. Untreated obstructive sleep apnea when it is moderate to severe can have an  adverse impact on cardiovascular health and raise her risk for heart disease, arrhythmias, hypertension, congestive heart failure, stroke and diabetes. Untreated obstructive sleep apnea causes sleep disruption, nonrestorative sleep, and sleep deprivation. This can have an impact on your day to day functioning and cause daytime sleepiness and impairment of cognitive function, memory loss, mood disturbance, and problems focussing. Using CPAP regularly can improve these symptoms.  We will update supply orders, today.    Please make sure you are staying well hydrated. I recommend 50-60 ounces daily. Well balanced diet and regular exercise encouraged. Consistent sleep schedule with 6-8 hours recommended.   Please continue follow up with care team as directed.   Follow up with *** in ***  You may receive a survey regarding today's visit. I encourage you to leave honest feed back as I do use this information to improve patient care. Thank you for seeing me today!

## 2023-07-03 NOTE — Progress Notes (Unsigned)
PATIENT: Cecilia S Wuebker DOB: [birth year]  REASON FOR VISIT: follow up HISTORY FROM: patient  No chief complaint on file.    HISTORY: (copied from my note on 01/12/2020)   HISTORY Theron Kathie Rhodes Mahurin is all 57 years old right-handed [place] American male, native of [place], return to clinic for followup seizure, last clinical visit was May 2013 with [name]   He began to have seizure since August 05 2009, it was nocturnal seizure, generalized tonic-clonic lasting about 10-15 minutes.   Evaluation showed normal MRI of the brain, EEG, laboratory showed normal CBC, CMP, UA, negative UDS.   Second nocturnal seizure July 2011, third seizure in February 2012, he was visiting his friend with his wife, he began to circling around, confused, and then went to tonic-clonic , he was started on Keppra 500 mg twice a day in 2012,   4 seizure was in June 2013, while he was not compliant with his Keppra, he was taken to emergency room, had multiple abrasions  to his face, and right eye, CAT scan of the brain and neck showed no significant abnormality.   Fifths seizure in May 2014, again happened when he was not compliant with his Keppra. Proceeding by rising sensation, nauseous,   He also complains of symptoms consistent with obstructive sleep apnea, had sleep study in January 2011, at [facility] long, by Dr. Griffin Basil, there was evidence of mild obstructive sleep apnea, lowest SpO2 was 82%, he was given advise of losing weight, sleep on his side, he continued to have frequent snoring, wife has to shake him to stop snoring, start breathing almost every night   UPDATE December 19 2016: [name] clinical visit was on December 20 2015, as the seizure was in July 10 2014 after he misses his Keppra dose, is now taking Keppra 500 mg twice a day.   He has been compliant with his CPAP machine, which help him sleep better   UPDATE April 11th,  2019CM Mr. [name] 57 year old male returns for follow-up, with history of seizure disorder.   Last seizure occurred in 2015.  He is currently on Keppra XR 500 mg 2 tablets at bedtime.  He denies missing any doses.  CPAP compliance dated 11/17/2017- 12/16/2017 shows compliance greater than 4 hours for 60% for 18 days.  Average usage 4 hours 52 minutes.  Set pressure 7 cm.  EPR level 1.  AHI 7.7 ESS 5.  Patient reports he does not feel he is getting enough pressure from his CPAP.  FSS 16.  He returns for reevaluation   Today 04/14/18 Mr. Cocanougher is a 57 year old male with a history of obstructive sleep apnea on CPAP as well as seizures.  His CPAP download indicates that he uses machine 25 out of 90 days for compliance of 83%.  He uses machine greater than 4 hours 51 out of 90 days for compliance of 57%.  On average he uses his machine 5 hours.  His residual AHI is 3.7 on 8 cm of water with EPR of 1.  The patient states that sometimes he does not go to bed till after 1 AM.  Patient repots that he does not use the machine during the day when he naps.  He remains on Keppra.  Denies any seizure events.  He returns today for evaluation.   UPDATE 02/16/19 ALL Kenyatte S Shoaf is a 57 y.o. male here today for follow up of OSA on CPAP.  He is doing very well with CPAP therapy.  Download  report dated 01/13/2019 through 02/11/2019 reveals that he is used his CPAP 29 out of the last 30 days for compliance of 97%.  29 of the days he used his machine for greater than 4 hours for compliance of 97%.  Average usage was 6 hours and 22 minutes.  AHI was 4.7 on 8 cm of water with an EPR of 1.  There is no significant leak.  He states that his machine is older and is requesting a replacement CPAP.  He has reached out to his DME provider who asked for updated follow-up.  Otherwise he is doing very well and notes significant benefit with CPAP therapy.  He also continues with Keppra 1000 mg every night.  He is tolerating medication well.  He denies any seizure activity.   UPDATE 01/12/2020 ALL Qunicy S Adamczak is a 57 y.o. male here  today for follow up of OSA on CPAP and seizure. He admits that he has gotten out of the habit of using CPAP. He denies any concerns with therapy. He wishes to resume daily therapy.   Compliance report dated 12/12/2019 through 01/10/2020 reveals that he has used CPAP therapy 9 of the past 30 days for compliance of 30%.  He did use CPAP greater than 4 hours those 9 days for compliance of 30%.  Average use on days used was 5 hours and 13 minutes.  Residual AHI was 4.0 on 8 cm of water.  There was no leak noted.  Review of 90-day compliance report dated 10/13/2019 through 01/10/2020 reveals daily compliance of 60% and 4-hour compliance of 52%.  He continues levetiracetam XR 1,000mg  daily. No recent seizure activity. He is feeling well and without concerns today.   UPDATE 07/19/2020 ALL:  YEN MORRIS is a 57 y.o. male here today for follow up for OSA on CPAP and seizures. He continues levetiracetam XR 1000mg  for seizure management and gabapentin 300mg  daily as needed for neck pain. No seizure activity since 2015.   Compliance report dated 06/18/2020-07/17/2020 reveals a use CPAP 27 of the past 30 days for compliance of 90%.  The CPAP greater than 4 hours 14 of the past 30 days for compliance of 47%.  Average usage on days used was 4 hours and 18 minutes.  Residual AHI was 4.5 on a set pressure of 8 cm of water.  There was no significant leak noted. 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 He does feel sleepy during the daytime but feels CPAP therapy has helped significantly.   UPDATE 01/17/2022 ALL: DAVAN DUBS is a 57 y.o. male here today for follow up for OSA on CPAP and seizures. He was last seen by Dr Terrace Arabia 07/20/2021 and reported a breakthrough seizure. He was advised to increase levetiracetam XR to 1500mg  for seizure management. No longer taking gabapentin for neck pain. No seizure activity since. He has resumed driving. He works for [employer]. He typically works from 4-10am  then again from 4p-8p.   He continues CPAP therapy nightly for about 4-5 hours. He has continued to note benefit with CPAP. He has less daytime sleepiness. He denies sleepiness with driving.    UPDATE 07/04/2023 ALL:  Giffin returns for follow up for or OSA on CPAP and seizures. He continues lev XR 1500mg  daily.      REVIEW OF SYSTEMS: Out of a complete 14 system review of symptoms, the patient complains only of the following symptoms, none and  all other reviewed systems are negative.  ESS: 2  ALLERGIES: No Known Allergies  HOME MEDICATIONS: Outpatient Medications Prior to Visit  Medication Sig Dispense Refill   amLODipine (NORVASC) 5 MG tablet Take 5 mg by mouth daily.     APIXABAN (ELIQUIS) VTE STARTER PACK (10MG  AND 5MG ) Take as directed on package: start with two-5mg  tablets twice daily for 7 days. On day 8, switch to one-5mg  tablet twice daily. 74 each 0   doxycycline (VIBRA-TABS) 100 MG tablet Take 100 mg by mouth 2 (two) times daily.     finasteride (PROSCAR) 5 MG tablet Take 5 mg by mouth daily.     HYDROcodone-acetaminophen (NORCO) 7.5-325 MG tablet Take 1 tablet by mouth every 6 (six) hours as needed.     levETIRAcetam (KEPPRA XR) 500 MG 24 hr tablet Take 4 tablets (2,000 mg total) by mouth at bedtime. 360 tablet 3   LINZESS 72 MCG capsule Take 72 mcg by mouth daily.     meloxicam (MOBIC) 15 MG tablet Take 15 mg by mouth daily.     metoprolol succinate (TOPROL-XL) 50 MG 24 hr tablet Take 50 mg by mouth daily.  5   tamsulosin (FLOMAX) 0.4 MG CAPS capsule Take 0.4 mg by mouth daily.     Vitamin D, Ergocalciferol, (DRISDOL) 1.25 MG (50000 UNIT) CAPS capsule Take 50,000 Units by mouth once a week.     No facility-administered medications prior to visit.    PAST MEDICAL HISTORY: Past Medical History:  Diagnosis Date   Acute urinary retention 12/22/2011   BPH (benign prostatic hyperplasia) 08/24/2013   Gross hematuria 12/22/2011   History of kidney stones    History of  urinary retention    12/2011   Migraine    Mild obstructive sleep apnea    PER STUDY 09-14-2009-uses CPAP   Seizure disorder, grand mal (HCC)    PER PT LAST SEIZURE 01/2013   Seizures (HCC)    Ureteral calculus 12/22/2011    PAST SURGICAL HISTORY: Past Surgical History:  Procedure Laterality Date   CYSTO/  RIGHT RETROGRADE PYELOGRAM/  URETEROSCOPY  12-23-2011   EXCISION LIPOMA OF SCALP AND FACE  03-24-2005   INGUINAL HERNIA REPAIR Bilateral 10/18/2016   Procedure: LAPAROSCOPIC EXPLORATION AND REPAIR OF BILATERAL INGUINAL HERNIA WITH A TAPP REPAIR;  Surgeon: Karie Soda, MD;  Location: [facility];  Service: General;  Laterality: Bilateral;   INSERTION OF MESH Bilateral 10/18/2016   Procedure: INSERTION OF MESH;  Surgeon: Karie Soda, MD;  Location: [facility];  Service: General;  Laterality: Bilateral;   LAPAROSCOPIC LYSIS OF ADHESIONS  10/18/2016   Procedure: LAPAROSCOPIC LYSIS OF ADHESIONS;  Surgeon: Karie Soda, MD;  Location: [facility];  Service: General;;   SHOULDER SURGERY Left 2009   TRANSURETHRAL RESECTION OF PROSTATE N/A 08/24/2013   Procedure: TRANSURETHRAL RESECTION OF THE PROSTATE WITH GYRUS INSTRUMENTS;  Surgeon: Valetta Fuller, MD;  Location: [facility];  Service: Urology;  Laterality: N/A;    FAMILY HISTORY: No family history on file.  SOCIAL HISTORY: Social History   Socioeconomic History   Marital status: Married    Spouse name: Not on file   Number of children: 4   Years of education: 12   Highest education level: Not on file  Occupational History   Occupation: Taxi driver  Tobacco Use   Smoking status: Never   Smokeless tobacco: Never  Vaping Use   Vaping status: Never Used  Substance and Sexual Activity   Alcohol use: No  Alcohol/week: 0.0 standard drinks of alcohol   Drug use: No   Sexual activity: Not on file  Other Topics Concern   Not on file  Social History Narrative   Patient lives at home with his wife Rande Brunt)   Patient works  as a Media planner part time.   Right handed   Patient drinks one cup of tea per week.   Patient has four children.      Originally [place]   Social Determinants of Health   Financial Resource Strain: Not on file  Food Insecurity: Not on file  Transportation Needs: Not on file  Physical Activity: Not on file  Stress: Not on file  Social Connections: Not on file  Intimate Partner Violence: Not on file     PHYSICAL EXAM  There were no vitals filed for this visit.   There is no height or weight on file to calculate BMI.  Generalized: Well developed, in no acute distress  Cardiology: normal rate and rhythm, no murmur noted Respiratory: clear to auscultation bilaterally  Neurological examination  Mentation: Alert oriented to time, place, history taking. Follows all commands speech and language fluent Cranial nerve II-XII: Pupils were equal round reactive to light. Extraocular movements were full, visual field were full  Motor: The motor testing reveals 5 over 5 strength of all 4 extremities. Good symmetric motor tone is noted throughout.  Gait and station: Gait is normal.    DIAGNOSTIC DATA (LABS, IMAGING, TESTING) - I reviewed patient records, labs, notes, testing and imaging myself where available.      No data to display           Lab Results  Component Value Date   WBC 10.9 (H) 12/07/2022   HGB 14.5 12/07/2022   HCT 44.9 12/07/2022   MCV 80.5 12/07/2022   PLT 239 12/07/2022      Component Value Date/Time   NA 132 (L) 12/07/2022 2022   NA 141 10/16/2022 1148   K 3.6 12/07/2022 2022   CL 102 12/07/2022 2022   CO2 23 12/07/2022 2022   GLUCOSE 176 (H) 12/07/2022 2022   BUN 17 12/07/2022 2022   BUN 11 10/16/2022 1148   CREATININE 1.12 12/07/2022 2022   CREATININE 1.12 05/29/2022 0000   CALCIUM 9.0 12/07/2022 2022   PROT 6.9 10/16/2022 1148   ALBUMIN 4.5 10/16/2022 1148   AST 24 10/16/2022 1148   ALT 23 10/16/2022 1148   ALKPHOS 75  10/16/2022 1148   BILITOT 0.4 10/16/2022 1148   GFRNONAA >60 12/07/2022 2022   GFRAA >60 10/09/2016 0947   Lab Results  Component Value Date   CHOL 180 05/29/2022   HDL 39 (L) 05/29/2022   LDLCALC 121 (H) 05/29/2022   TRIG 95 05/29/2022   CHOLHDL 4.6 05/29/2022   Lab Results  Component Value Date   HGBA1C 6.8 (H) 10/16/2022   Lab Results  Component Value Date   VITAMINB12 684 07/03/2022   Lab Results  Component Value Date   TSH 0.59 05/29/2022     ASSESSMENT AND PLAN 57 y.o. year old male  has a past medical history of Acute urinary retention (12/22/2011), BPH (benign prostatic hyperplasia) (08/24/2013), Gross hematuria (12/22/2011), History of kidney stones, History of urinary retention, Migraine, Mild obstructive sleep apnea, Seizure disorder, grand mal (HCC), Seizures (HCC), and Ureteral calculus (12/22/2011). here with   No diagnosis found.   Ubaldo S Erich is doing well on CPAP therapy. Daily compliance at goal with sub  optimal 4 hour compliance. He was encouraged to continue using CPAP nightly and for greater than 4 hours each night. We will update supply orders as indicated. Risks of untreated sleep apnea review and education materials provided. He will continue levetiracetam XR 1500mg  daily. Seizure precautions reviewed. Healthy lifestyle habits encouraged. He will follow up in 1 year, sooner if needed. He verbalizes understanding and agreement with this plan.    No orders of the defined types were placed in this encounter.    No orders of the defined types were placed in this encounter.     Shawnie Dapper, FNP-C 07/03/2023, 12:51 PM [facility] [address], [address] [place], [place] [address] [phone]

## 2023-07-04 ENCOUNTER — Encounter: Payer: Self-pay | Admitting: Family Medicine

## 2023-07-04 ENCOUNTER — Ambulatory Visit: Payer: Medicaid Other | Admitting: Family Medicine

## 2023-07-04 VITALS — BP 128/75 | HR 64 | Ht 68.0 in | Wt 189.5 lb

## 2023-07-04 DIAGNOSIS — G4733 Obstructive sleep apnea (adult) (pediatric): Secondary | ICD-10-CM | POA: Diagnosis not present

## 2023-07-04 DIAGNOSIS — R202 Paresthesia of skin: Secondary | ICD-10-CM

## 2023-07-04 DIAGNOSIS — R569 Unspecified convulsions: Secondary | ICD-10-CM | POA: Diagnosis not present

## 2023-07-04 MED ORDER — LEVETIRACETAM ER 500 MG PO TB24: 2000.0000 mg | ORAL_TABLET | Freq: Every day | ORAL | 3 refills | Status: DC

## 2023-07-12 ENCOUNTER — Ambulatory Visit (HOSPITAL_COMMUNITY): Payer: Medicaid Other

## 2023-07-15 ENCOUNTER — Encounter (HOSPITAL_COMMUNITY): Payer: Self-pay

## 2023-07-15 ENCOUNTER — Ambulatory Visit (HOSPITAL_COMMUNITY)
Admission: RE | Admit: 2023-07-15 | Discharge: 2023-07-15 | Disposition: A | Discharge status: Home / Self Care | Payer: Medicaid Other | Source: Ambulatory Visit | Attending: Vascular Surgery | Admitting: Vascular Surgery

## 2023-07-15 VITALS — BP 121/80 | HR 64

## 2023-07-15 DIAGNOSIS — I82411 Acute embolism and thrombosis of right femoral vein: Secondary | ICD-10-CM | POA: Diagnosis present

## 2023-07-15 NOTE — Progress Notes (Signed)
DVT Clinic Note  Name: Francis Jackson     MRN: [id number]     DOB: 12-10-1965     Sex: male  PCP: Fleet Contras, MD  Today's Visit: Visit Information: Follow Up Visit  Referred to DVT Clinic by: Primary Care - Dr. Marliss Coots Referred to CPP by: Dr. Chestine Spore Reason for referral:  Chief Complaint  Patient presents with   Med Management - DVT   HISTORY OF PRESENT ILLNESS: Francis Jackson is a 57 y.o. male with PMH BPH, HTN, chronic low back pain, who presents after diagnosis of DVT for medication management. He developed right lower leg pain and swelling in his foot 1 week prior to seeing his PCP 06/12/23. Ultrasound 06/13/23 showed acute DVT extending from the right common femoral vein through the calf veins. No CP or SOB. He has no prior history of VTE. 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 He takes his medications on an empty stomach. Last seen in DVT Clinic 06/13/23 at which time vascular surgeon Dr. Hetty Blend recommended no vascular intervention and Eliquis was started.   Today patient reports the pain and swelling in his leg are improving but not yet resolved. Denies abnormal bleeding or bruising. Denies missed doses of Eliquis. Denies wearing compression stockings but is elevating his legs at night. The swelling goes down overnight then worsens again during the day. He has not increased his physical activity during his Ly[employer] drives. Since his last visit, his PCP sent in refills of Eliquis to his pharmacy which he was able to pick up without issue.   Positive Thrombotic Risk Factors: Sedentary journey lasting >8 hours within 4 weeks Bleeding Risk Factors: Anticoagulant therapy  Negative Thrombotic Risk Factors: Previous VTE, Recent surgery (within 3 months), Recent trauma (within 3 months), Recent admission to hospital with acute illness (within 3 months), Paralysis, paresis, or recent plaster cast immobilization of lower extremity, Central venous catheterization,  Bed rest >72 hours within 3 months, Pregnancy, Within 6 weeks postpartum, Recent cesarean section (within 3 months), Estrogen therapy, Testosterone therapy, Erythropoiesis-stimulating agent, Recent COVID diagnosis (within 3 months), Active cancer, Non-malignant, chronic inflammatory condition, Known thrombophilic condition, Smoking, Obesity, Older age  Rx Insurance Coverage: Medicaid Rx Affordability: Eliquis or Xarelto are both $4 per 30 or 90 day supply  Preferred Pharmacy: Starter pack filled at [facility] during initial DVT Clinic visit. PCP managing refills.   Past Medical History:  Diagnosis Date   Acute urinary retention 12/22/2011   BPH (benign prostatic hyperplasia) 08/24/2013   Gross hematuria 12/22/2011   History of kidney stones    History of urinary retention    12/2011   Migraine    Mild obstructive sleep apnea    PER STUDY 09-14-2009-uses CPAP   Seizure disorder, grand mal (HCC)    PER PT LAST SEIZURE 01/2013   Seizures (HCC)    Ureteral calculus 12/22/2011    Past Surgical History:  Procedure Laterality Date   CYSTO/  RIGHT RETROGRADE PYELOGRAM/  URETEROSCOPY  12-23-2011   EXCISION LIPOMA OF SCALP AND FACE  03-24-2005   INGUINAL HERNIA REPAIR Bilateral 10/18/2016   Procedure: LAPAROSCOPIC EXPLORATION AND REPAIR OF BILATERAL INGUINAL HERNIA WITH A TAPP REPAIR;  Surgeon: Karie Soda, MD;  Location: [facility];  Service: General;  Laterality: Bilateral;   INSERTION OF MESH Bilateral 10/18/2016   Procedure: INSERTION OF MESH;  Surgeon: Karie Soda, MD;  Location: [facility];  Service: General;  Laterality: Bilateral;   LAPAROSCOPIC LYSIS  OF ADHESIONS  10/18/2016   Procedure: LAPAROSCOPIC LYSIS OF ADHESIONS;  Surgeon: Karie Soda, MD;  Location: [facility];  Service: General;;   SHOULDER SURGERY Left 2009   TRANSURETHRAL RESECTION OF PROSTATE N/A 08/24/2013   Procedure: TRANSURETHRAL RESECTION OF THE PROSTATE WITH GYRUS INSTRUMENTS;  Surgeon: Valetta Fuller, MD;  Location: [facility];  Service: Urology;  Laterality: N/A;    Social History   Socioeconomic History   Marital status: Married    Spouse name: Not on file   Number of children: 4   Years of education: 12   Highest education level: Not on file  Occupational History   Occupation: Taxi driver  Tobacco Use   Smoking status: Never   Smokeless tobacco: Never  Vaping Use   Vaping status: Never Used  Substance and Sexual Activity   Alcohol use: No    Alcohol/week: 0.0 standard drinks of alcohol   Drug use: No   Sexual activity: Not on file  Other Topics Concern   Not on file  Social History Narrative   Patient lives at home with his wife Francis Jackson)   Patient works as a Media planner part time.   Right handed   Patient drinks one cup of tea per week.   Patient has four children.      Originally from [place]   Social Determinants of Health   Financial Resource Strain: Not on file  Food Insecurity: Not on file  Transportation Needs: Not on file  Physical Activity: Not on file  Stress: Not on file  Social Connections: Not on file  Intimate Partner Violence: Not on file    No family history on file.  Allergies as of 07/15/2023   (No Known Allergies)    Current Outpatient Medications on File Prior to Encounter  Medication Sig Dispense Refill   ELIQUIS 5 MG TABS tablet Take 5 mg by mouth 2 (two) times daily.     famotidine (PEPCID) 20 MG tablet Take 20 mg by mouth 2 (two) times daily.     HYDROcodone-acetaminophen (NORCO/VICODIN) 5-325 MG tablet Take 1 tablet by mouth daily as needed.     amLODipine (NORVASC) 5 MG tablet Take 5 mg by mouth daily.     finasteride (PROSCAR) 5 MG tablet Take 5 mg by mouth daily.     levETIRAcetam (KEPPRA XR) 500 MG 24 hr tablet Take 4 tablets (2,000 mg total) by mouth at bedtime. 360 tablet 3   LINZESS 72 MCG capsule Take 72 mcg by mouth daily.     meloxicam (MOBIC) 15 MG tablet Take 15 mg by mouth daily.     metoprolol succinate  (TOPROL-XL) 50 MG 24 hr tablet Take 50 mg by mouth daily.  5   tamsulosin (FLOMAX) 0.4 MG CAPS capsule Take 0.4 mg by mouth daily.     Vitamin D, Ergocalciferol, (DRISDOL) 1.25 MG (50000 UNIT) CAPS capsule Take 50,000 Units by mouth once a week.     No current facility-administered medications on file prior to encounter.   REVIEW OF SYSTEMS:  Review of Systems  Respiratory:  Negative for shortness of breath.   Cardiovascular:  Positive for leg swelling. Negative for chest pain and palpitations.  Musculoskeletal:  Positive for myalgias.  Neurological:  Negative for dizziness.   PHYSICAL EXAMINATION:  Vitals:   07/15/23 1342  BP: 121/80  Pulse: 64  SpO2: 100%   Physical Exam Vitals reviewed.  Cardiovascular:     Rate and Rhythm: Normal  rate.  Pulmonary:     Effort: Pulmonary effort is normal.  Musculoskeletal:        General: No tenderness.     Right lower leg: Edema (1 to 2+) present.     Left lower leg: No edema.  Skin:    Findings: Erythema present. No bruising.  Psychiatric:        Mood and Affect: Mood normal.        Behavior: Behavior normal.        Thought Content: Thought content normal.   Villalta Score for Post-Thrombotic Syndrome: Pain: Mild Cramps: Mild Heaviness: Mild Paresthesia: Mild Pruritus: Absent Pretibial Edema: Moderate Skin Induration: Absent Hyperpigmentation: Absent Redness: Mild Venous Ectasia: Absent Pain on calf compression: Absent Villalta Preliminary Score: 7 Is venous ulcer present?: No If venous ulcer is present and score is <15, then 15 points total are assigned: Absent Villalta Total Score: 7  LABS:  CBC     Component Value Date/Time   WBC 10.9 (H) 12/07/2022 2022   RBC 5.58 12/07/2022 2022   HGB 14.5 12/07/2022 2022   HGB 14.3 10/16/2022 1148   HCT 44.9 12/07/2022 2022   HCT 45.1 10/16/2022 1148   PLT 239 12/07/2022 2022   PLT 293 10/16/2022 1148   MCV 80.5 12/07/2022 2022   MCV 80 10/16/2022 1148   MCH 26.0  12/07/2022 2022   MCHC 32.3 12/07/2022 2022   RDW 15.3 12/07/2022 2022   RDW 14.4 10/16/2022 1148   LYMPHSABS 1.8 10/16/2022 1148   MONOABS 0.8 03/22/2022 2155   EOSABS 0.1 10/16/2022 1148   BASOSABS 0.0 10/16/2022 1148    Hepatic Function      Component Value Date/Time   PROT 6.9 10/16/2022 1148   ALBUMIN 4.5 10/16/2022 1148   AST 24 10/16/2022 1148   ALT 23 10/16/2022 1148   ALKPHOS 75 10/16/2022 1148   BILITOT 0.4 10/16/2022 1148    Renal Function   Lab Results  Component Value Date   CREATININE 1.12 12/07/2022   CREATININE 1.08 10/16/2022   CREATININE 1.12 05/29/2022    CrCl cannot be calculated (Patient's most recent lab result is older than the maximum 21 days allowed.).   [facility] Vascular Lab Studies:  06/13/23 VAS [place] LOWER EXTREMITY VENOUS (DVT)RIGHT Summary:  RIGHT:  - Findings consistent with acute deep vein thrombosis involving the right  common femoral vein, right femoral vein, right proximal profunda vein,  right popliteal vein, right posterior tibial veins, and right peroneal  veins. - No cystic structure found in the popliteal fossa.    LEFT:  - No evidence of common femoral vein obstruction.   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   Patient with acute extensive RLE DVT. Given involvement of the common femoral vein, discussed the patient with Dr. Hetty Blend at his last visit. Since the DVT is only partially occlusive at the common femoral vein and there are no significant symptoms of pain or swelling above the knee, no need for vascular surgery intervention at this time. Will medically manage with anticoagulation. Only risk factor present is patient's occupation as a Patent attorney. He has two ~5 hour spans during the day that he drives and currently only gets  out of the car for bathroom breaks. Will treat with a 3 month course of anticoagulation  and work on the patient getting up and moving more throughout the day between shifts. If he were to develop another DVT in the future, would need to consider long-term anticoagulation.   He is tolerating Eliquis well and taking it as prescribed. His PCP is providing refills. Pain and swelling have started to improve. There is still extensive RLE swelling below the knee. Stressed the importance of starting to wear compression stockings during the day and elevating when he is off his feet, which he was encouraged to do at his last visit as well. He also has not increased his physical activity very much during his breaks. He plans to start doing this now and we will check in at his next visit.   PLAN: -Continue apixaban (Eliquis) 5 mg twice daily. -Expected duration of therapy: 3 months. Therapy started on 06/13/23. -Patient educated on purpose, proper use and potential adverse effects of apixaban (Eliquis). -Discussed importance of taking medication around the same time every day. -Advised patient of medications to avoid (NSAIDs, aspirin doses >100 mg daily). -Educated that Tylenol (acetaminophen) is the preferred analgesic to lower the risk of bleeding. -Advised patient to alert all providers of anticoagulation therapy prior to starting a new medication or having a procedure. -Emphasized importance of monitoring for signs and symptoms of bleeding (abnormal bruising, prolonged bleeding, nose bleeds, bleeding from gums, discolored urine, black tarry stools). -Educated patient to present to the ED if emergent signs and symptoms of new thrombosis occur. -Counseled patient to wear compression stockings daily, removing at night. Elevate legs daily as long as swelling is present.   Follow up: 2 months for end of treatment visit.  Pervis Hocking, PharmD, [place]y [place], CPP [facility]rombosis [facility] Clinical Pharmacist  Practitioner Office: [phone]

## 2023-07-15 NOTE — Patient Instructions (Signed)
-  Continue apixaban (Eliquis) 5 mg twice daily. -It is important to take your medication around the same time every day.  -Avoid NSAIDs like ibuprofen (Advil, Motrin) and naproxen (Aleve) as well as aspirin doses over 100 mg daily. -Tylenol (acetaminophen) is the preferred over the counter pain medication to lower the risk of bleeding. -Be sure to alert all of your health care providers that you are taking an anticoagulant prior to starting a new medication or having a procedure. -Monitor for signs and symptoms of bleeding (abnormal bruising, prolonged bleeding, nose bleeds, bleeding from gums, discolored urine, black tarry stools). If you have fallen and hit your head OR if your bleeding is severe or not stopping, seek emergency care.  -Go to the emergency room if emergent signs and symptoms of new clot occur (new or worse swelling and pain in an arm or leg, shortness of breath, chest pain, fast or irregular heartbeats, lightheadedness, dizziness, fainting, coughing up blood) or if you experience a significant color change (pale or blue) in the extremity that has the DVT.  -We recommend you wear knee high compression stockings (20-30 mmHg) as long as you are having swelling or pain. Be sure to purchase the correct size and take them off at night.  -Continue elevating your legs at night.   Your next visit is on Tuesday January 7th at 1:30 PM.  [facility] [address] [place], [place], [place] [address] Enter the hospital through Entrance C off [facility] and pull up to the [facility] entrance to the free valet parking.  Check in for your appointment at the [facility] V[facility].   If you have any questions or need to reschedule an appointment, please call [phone] [facility].  If you are having an emergency, call 911 or present to the nearest emergency room.   What is a DVT?  -Deep vein thrombosis (DVT) is a condition in which a blood clot forms in a vein  of the deep venous system which can occur in the lower leg, thigh, pelvis, arm, or neck. This condition is serious and can be life-threatening if the clot travels to the arteries of the lungs and causing a blockage (pulmonary embolism, PE). A DVT can also damage veins in the leg, which can lead to long-term venous disease, leg pain, swelling, discoloration, and ulcers or sores (post-thrombotic syndrome).  -Treatment may include taking an anticoagulant medication to prevent more clots from forming and the current clot from growing, wearing compression stockings, and/or surgical procedures to remove or dissolve the clot.

## 2023-09-17 ENCOUNTER — Ambulatory Visit (HOSPITAL_COMMUNITY): Payer: Medicaid Other

## 2023-12-10 ENCOUNTER — Encounter: Payer: Self-pay | Admitting: Family Medicine

## 2023-12-10 ENCOUNTER — Telehealth: Payer: Self-pay | Admitting: Family Medicine

## 2023-12-10 ENCOUNTER — Ambulatory Visit: Admitting: Family Medicine

## 2023-12-10 VITALS — BP 115/68 | HR 55 | Ht 68.0 in | Wt 187.0 lb

## 2023-12-10 DIAGNOSIS — G4733 Obstructive sleep apnea (adult) (pediatric): Secondary | ICD-10-CM | POA: Diagnosis not present

## 2023-12-10 DIAGNOSIS — R569 Unspecified convulsions: Secondary | ICD-10-CM | POA: Diagnosis not present

## 2023-12-10 NOTE — Telephone Encounter (Signed)
 Made pt a f/u appointment today with Amy,NP

## 2023-12-10 NOTE — Progress Notes (Signed)
 PATIENT: Francis Jackson DOB: 05-19-1966  REASON FOR VISIT: follow up HISTORY FROM: patient  Chief Complaint  Patient presents with   Follow-up    Pt in room 2. Alone. Here to discuss getting a new cpap machine. Pt said the water in cpap machine was boiling. Pt said DME told him machine is too old 2010.     HISTORY: (copied from my note on 01/12/2020)   HISTORY Francis Jackson is Francis 58 years old right-handed [place] American male, native of [place], return to clinic for followup seizure, last clinical visit was May 2013 with [name]   He began to have seizure since August 05 2009, it was nocturnal seizure, generalized tonic-clonic lasting about 10-15 minutes.   Evaluation showed normal MRI of the brain, EEG, laboratory showed normal CBC, CMP, UA, negative UDS.   Second nocturnal seizure July 2011, third seizure in February 2012, he was visiting his friend with his wife, he began to circling around, confused, and then went to tonic-clonic , he was started on Keppra 500 mg twice a day in 2012,   4 seizure was in June 2013, while he was not compliant with his Keppra, he was taken to emergency room, had multiple abrasions  to his face, and right eye, CAT scan of the brain and neck showed no significant abnormality.   Fifths seizure in May 2014, again happened when he was not compliant with his Keppra. Proceeding by rising sensation, nauseous,   He also complains of symptoms consistent with obstructive sleep apnea, had sleep study in January 2011, at [facility] long, by Dr. Griffin Basil, there was evidence of mild obstructive sleep apnea, lowest SpO2 was 82%, he was given advise of losing weight, sleep on his side, he continued to have frequent snoring, wife has to shake him to stop snoring, start breathing almost every night   UPDATE December 19 2016: [name] clinical visit was on December 20 2015, as the seizure was in July 10 2014 after he misses his Keppra dose, is now taking Keppra 500 mg twice a day.    He has been compliant with his CPAP machine, which help him sleep better   UPDATE April 11th,  2019CM Mr. Althea Jackson 58 year old male returns for follow-up, with history of seizure disorder.  Last seizure occurred in 2015.  He is currently on Keppra XR 500 mg 2 tablets at bedtime.  He denies missing any doses.  CPAP compliance dated 11/17/2017- 12/16/2017 shows compliance greater than 4 hours for 60% for 18 days.  Average usage 4 hours 52 minutes.  Set pressure 7 Francis.  EPR level 1.  AHI 7.7 ESS 5.  Patient reports he does not feel he is getting enough pressure from his CPAP.  FSS 16.  He returns for reevaluation   Today 04/14/18 Mr. Wegmann is a 58 year old male with a history of obstructive sleep apnea on CPAP as well as seizures.  His CPAP download indicates that he uses machine 25 out of 90 days for compliance of 83%.  He uses machine greater than 4 hours 51 out of 90 days for compliance of 57%.  On average he uses his machine 5 hours.  His residual AHI is 3.7 on 8 Francis of water with EPR of 1.  The patient states that sometimes he does not go to bed till after 1 AM.  Patient repots that he does not use the machine during the day when he naps.  He remains on Keppra.  Denies any seizure  events.  He returns today for evaluation.   UPDATE 02/16/19 Francis Jackson is a 58 y.o. male here today for follow up of OSA on CPAP.  He is doing very well with CPAP therapy.  Download report dated 01/13/2019 through 02/11/2019 reveals that he is used his CPAP 29 out of the last 30 days for compliance of 97%.  29 of the days he used his machine for greater than 4 hours for compliance of 97%.  Average usage was 6 hours and 22 minutes.  AHI was 4.7 on 8 Francis of water with an EPR of 1.  There is no significant leak.  He states that his machine is older and is requesting a replacement CPAP.  He has reached out to his DME provider who asked for updated follow-up.  Otherwise he is doing very well and notes significant benefit with CPAP  therapy.  He also continues with Keppra 1000 mg every night.  He is tolerating medication well.  He denies any seizure activity.   UPDATE 01/12/2020 Francis Jackson is a 58 y.o. male here today for follow up of OSA on CPAP and seizure. He admits that he has gotten out of the habit of using CPAP. He denies any concerns with therapy. He wishes to resume daily therapy.   Compliance report dated 12/12/2019 through 01/10/2020 reveals that he has used CPAP therapy 9 of the past 30 days for compliance of 30%.  He did use CPAP greater than 4 hours those 9 days for compliance of 30%.  Average use on days used was 5 hours and 13 minutes.  Residual AHI was 4.0 on 8 Francis of water.  There was no leak noted.  Review of 90-day compliance report dated 10/13/2019 through 01/10/2020 reveals daily compliance of 60% and 4-hour compliance of 52%.  He continues levetiracetam XR 1,000mg  daily. No recent seizure activity. He is feeling well and without concerns today.   UPDATE 07/19/2020 Francis:  Francis Jackson is a 58 y.o. male here today for follow up for OSA on CPAP and seizures. He continues levetiracetam XR 1000mg  for seizure management and gabapentin 300mg  daily as needed for neck pain. No seizure activity since 2015.   Compliance report dated 06/18/2020-07/17/2020 reveals a use CPAP 27 of the past 30 days for compliance of 90%.  The CPAP greater than 4 hours 14 of the past 30 days for compliance of 47%.  Average usage on days used was 4 hours and 18 minutes.  Residual AHI was 4.5 on a set pressure of 8 Francis of water.  There was no significant leak noted. 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 He does feel sleepy during the daytime but feels CPAP therapy has helped significantly.   UPDATE 01/17/2022 Francis: Francis Jackson is a 58 y.o. male here today for follow up for OSA on CPAP and seizures. He was last seen by Dr Terrace Arabia 07/20/2021 and reported a breakthrough seizure. He was advised to increase  levetiracetam XR to 1500mg  for seizure management. No longer taking gabapentin for neck pain. No seizure activity since. He has resumed driving. He works for [employer]. He typically works from 4-10am then again from 4p-8p.   He continues CPAP therapy nightly for about 4-5 hours. He has continued to note benefit with CPAP. He has less daytime sleepiness. He denies sleepiness with driving.    UPDATE Oct 16 2022 YY: He reported recurrent seizure  in November 2023, happened in his sleep, 2 to 3 AM, witnessed by his wife, wake up with tongue biting, this happened while he was taking Keppra xr 500 3 tablets every night, he tolerated the medication well, no significant side effect noted  UPDATE 07/04/2023 Francis:  Francis Jackson returns for follow up for or OSA on CPAP and seizures. He was last seen by Dr Terrace Arabia 10/2022 for breakthrough seizure occurring during sleep 07/2023. EEG showed sharp transient involving right hemisphere, centered at T8, spreading to adjacent leads, F8,  F4, C4, P4, suggesting focal irritability. Felicity Coyer was increased to 2000mg  daily. Since, he reports doing well on lev XR 2000mg  daily. No adverse effects. No seizures. He is back to driving. He works for [employer]. He is getting more sleep. Usually sleeps from 10p-4-5a then works from [employer]. He takes a nap in the afternoons and goes back to work around 5p-9p. He does not drink a lot of water. He is tolerating his CPAP. Current machine set up 02/2019.     UPDATE 12/10/2023 Francis: Francis Jackson returns for follow up for OSA on CPAP and seizures. He continues lev 2000mg  daily. Tolerating it well with no seizures.   He was previously doing well on CPAP therapy but recently concerned that it is not operating correctly. He is scared to use his machine as he feels the water is boiling. DME checked machine and told him he needs a new on as his was set up in 2010. We ordered a new machine for him 02/2019.   REVIEW OF SYSTEMS: Out of a complete 14 system review of symptoms, the  patient complains only of the following symptoms, none and Francis other reviewed systems are negative.  ESS: 2  ALLERGIES: No Known Allergies  HOME MEDICATIONS: Outpatient Medications Prior to Visit  Medication Sig Dispense Refill   amLODipine (NORVASC) 5 MG tablet Take 5 mg by mouth daily.     HYDROcodone-acetaminophen (NORCO/VICODIN) 5-325 MG tablet Take 1 tablet by mouth daily as needed.     levETIRAcetam (KEPPRA XR) 500 MG 24 hr tablet Take 4 tablets (2,000 mg total) by mouth at bedtime. 360 tablet 3   metoprolol succinate (TOPROL-XL) 50 MG 24 hr tablet Take 50 mg by mouth daily.  5   tamsulosin (FLOMAX) 0.4 MG CAPS capsule Take 0.4 mg by mouth daily.     ELIQUIS 5 MG TABS tablet Take 5 mg by mouth 2 (two) times daily. (Patient not taking: Reported on 12/10/2023)     famotidine (PEPCID) 20 MG tablet Take 20 mg by mouth 2 (two) times daily. (Patient not taking: Reported on 12/10/2023)     finasteride (PROSCAR) 5 MG tablet Take 5 mg by mouth daily. (Patient not taking: Reported on 12/10/2023)     LINZESS 72 MCG capsule Take 72 mcg by mouth daily. (Patient not taking: Reported on 12/10/2023)     meloxicam (MOBIC) 15 MG tablet Take 15 mg by mouth daily. (Patient not taking: Reported on 12/10/2023)     Vitamin D, Ergocalciferol, (DRISDOL) 1.25 MG (50000 UNIT) CAPS capsule Take 50,000 Units by mouth once a week. (Patient not taking: Reported on 12/10/2023)     No facility-administered medications prior to visit.    PAST MEDICAL HISTORY: Past Medical History:  Diagnosis Date   Acute urinary retention 12/22/2011   BPH (benign prostatic hyperplasia) 08/24/2013   Gross hematuria 12/22/2011   History of kidney stones    History of urinary retention    12/2011   Migraine  Mild obstructive sleep apnea    PER STUDY 09-14-2009-uses CPAP   Seizure disorder, grand mal (HCC)    PER PT LAST SEIZURE 01/2013   Seizures (HCC)    Ureteral calculus 12/22/2011    PAST SURGICAL HISTORY: Past Surgical History:   Procedure Laterality Date   CYSTO/  RIGHT RETROGRADE PYELOGRAM/  URETEROSCOPY  12-23-2011   EXCISION LIPOMA OF SCALP AND FACE  03-24-2005   INGUINAL HERNIA REPAIR Bilateral 10/18/2016   Procedure: LAPAROSCOPIC EXPLORATION AND REPAIR OF BILATERAL INGUINAL HERNIA WITH A TAPP REPAIR;  Surgeon: Karie Soda, MD;  Location: [facility];  Service: General;  Laterality: Bilateral;   INSERTION OF MESH Bilateral 10/18/2016   Procedure: INSERTION OF MESH;  Surgeon: Karie Soda, MD;  Location: [facility];  Service: General;  Laterality: Bilateral;   LAPAROSCOPIC LYSIS OF ADHESIONS  10/18/2016   Procedure: LAPAROSCOPIC LYSIS OF ADHESIONS;  Surgeon: Karie Soda, MD;  Location: [facility];  Service: General;;   SHOULDER SURGERY Left 2009   TRANSURETHRAL RESECTION OF PROSTATE N/A 08/24/2013   Procedure: TRANSURETHRAL RESECTION OF THE PROSTATE WITH GYRUS INSTRUMENTS;  Surgeon: Valetta Fuller, MD;  Location: [facility];  Service: Urology;  Laterality: N/A;    FAMILY HISTORY: History reviewed. No pertinent family history.  SOCIAL HISTORY: Social History   Socioeconomic History   Marital status: Married    Spouse name: Not on file   Number of children: 4   Years of education: 12   Highest education level: Not on file  Occupational History   Occupation: Taxi driver  Tobacco Use   Smoking status: Never   Smokeless tobacco: Never  Vaping Use   Vaping status: Never Used  Substance and Sexual Activity   Alcohol use: No    Alcohol/week: 0.0 standard drinks of alcohol   Drug use: No   Sexual activity: Not on file  Other Topics Concern   Not on file  Social History Narrative   Patient lives at home with his wife Rande Brunt)   Patient works as a Media planner part time.   Right handed   Patient drinks one cup of tea per week.   Patient has four children.      Originally [place]   Social Drivers of Health   Financial Resource Strain: Not on file  Food Insecurity: Not on  file  Transportation Needs: Not on file  Physical Activity: Not on file  Stress: Not on file  Social Connections: Not on file  Intimate Partner Violence: Not on file     PHYSICAL EXAM  Vitals:   12/10/23 1313  BP: 115/68  Pulse: (!) 55  Weight: 187 lb (84.8 kg)  Height: 5\' 8"  (1.727 m)      Body mass index is 28.43 kg/m.  Generalized: Well developed, in no acute distress  Cardiology: normal rate and rhythm, no murmur noted Respiratory: clear to auscultation bilaterally  Neurological examination  Mentation: Alert oriented to time, place, history taking. Follows Francis commands speech and language fluent Cranial nerve II-XII: Pupils were equal round reactive to light. Extraocular movements were full, visual field were full  Motor: The motor testing reveals 5 over 5 strength of Francis 4 extremities. Good symmetric motor tone is noted throughout.  Gait and station: Gait is normal.    DIAGNOSTIC DATA (LABS, IMAGING, TESTING) - I reviewed patient records, labs, notes, testing and imaging myself where available.      No data to display  Lab Results  Component Value Date   WBC 10.9 (H) 12/07/2022   HGB 14.5 12/07/2022   HCT 44.9 12/07/2022   MCV 80.5 12/07/2022   PLT 239 12/07/2022      Component Value Date/Time   NA 132 (L) 12/07/2022 2022   NA 141 10/16/2022 1148   K 3.6 12/07/2022 2022   CL 102 12/07/2022 2022   CO2 23 12/07/2022 2022   GLUCOSE 176 (H) 12/07/2022 2022   BUN 17 12/07/2022 2022   BUN 11 10/16/2022 1148   CREATININE 1.12 12/07/2022 2022   CREATININE 1.12 05/29/2022 0000   CALCIUM 9.0 12/07/2022 2022   PROT 6.9 10/16/2022 1148   ALBUMIN 4.5 10/16/2022 1148   AST 24 10/16/2022 1148   ALT 23 10/16/2022 1148   ALKPHOS 75 10/16/2022 1148   BILITOT 0.4 10/16/2022 1148   GFRNONAA >60 12/07/2022 2022   GFRAA >60 10/09/2016 0947   Lab Results  Component Value Date   CHOL 180 05/29/2022   HDL 39 (L) 05/29/2022   LDLCALC 121 (H)  05/29/2022   TRIG 95 05/29/2022   CHOLHDL 4.6 05/29/2022   Lab Results  Component Value Date   HGBA1C 6.8 (H) 10/16/2022   Lab Results  Component Value Date   VITAMINB12 684 07/03/2022   Lab Results  Component Value Date   TSH 0.59 05/29/2022     ASSESSMENT AND PLAN 58 y.o. year old male  has a past medical history of Acute urinary retention (12/22/2011), BPH (benign prostatic hyperplasia) (08/24/2013), Gross hematuria (12/22/2011), History of kidney stones, History of urinary retention, Migraine, Mild obstructive sleep apnea, Seizure disorder, grand mal (HCC), Seizures (HCC), and Ureteral calculus (12/22/2011). here with     ICD-10-Francis   1. OSA on CPAP  G47.33 Home sleep test    2. Seizures (HCC)  R56.9       Francis Jackson is doing well on CPAP therapy. Previous compliance is excellent from daily standpoint and acceptable 4 hour usage. Average usage 5 hours. AHI well managed. He has not used CPAP for the past couple of weeks due to concerns of malfunctioning.  He was encouraged to continue using CPAP nightly and for greater than 4 hours each night. We will update supply orders as indicated. We will repeat HST and plan to order new machine pending results. Risks of untreated sleep apnea review and education materials provided. He will continue levetiracetam XR 2000mg  daily. Seizure precautions reviewed. Adequate hydration reviewed. Healthy lifestyle habits encouraged. He will follow up in 31-90 days following set up of new CPAP. He verbalizes understanding and agreement with this plan.    Orders Placed This Encounter  Procedures   Home sleep test    Standing Status:   Future    Expiration Date:   12/09/2024    Where should this test be performed::   [facility]     No orders of the defined types were placed in this encounter.     Shawnie Dapper, FNP-C 12/10/2023, 1:52 PM [facility] [address] [place], [place] [address] [phone]

## 2023-12-10 NOTE — Patient Instructions (Signed)
 Please continue using your CPAP regularly. While your insurance requires that you use CPAP at least 4 hours each night on 70% of the nights, I recommend, that you not skip any nights and use it throughout the night if you can. Getting used to CPAP and staying with the treatment long term does take time and patience and discipline. Untreated obstructive sleep apnea when it is moderate to severe can have an adverse impact on cardiovascular health and raise her risk for heart disease, arrhythmias, hypertension, congestive heart failure, stroke and diabetes. Untreated obstructive sleep apnea causes sleep disruption, nonrestorative sleep, and sleep deprivation. This can have an impact on your day to day functioning and cause daytime sleepiness and impairment of cognitive function, memory loss, mood disturbance, and problems focussing. Using CPAP regularly can improve these symptoms.  We will update supply orders, today. You are eligible for a new machine. I will order a repeat sleep study that you will do at home. Please listen out for a call from our sleep lab staff to schedule. Once you complete study, our sleep doctors with evaluate the data and we will use that to order a new machine as indicated. This process could take a few weeks. Once new machine is ordered, you will hear back from your DME company to schedule set up of your new machine.   We will need to see you back within 31-90 days following set up of your new CPAP to document compliance as required by your insurance company. Please call the office to schedule follow up when you receive your new machine. Please feel free to reach out with any questions or concerns.    Continue levetiracetam 2000mg  daily.

## 2023-12-10 NOTE — Telephone Encounter (Signed)
 Patient was here in October for Cpap exam and he was given the number to call the supplier regarding his machine. The company told him to call the doctor come for an appointment to get a new prescription for a new machine. His machine is from 2010 and it gets really hot at night he said the water is boiling. Does he need to come in or can a prescription be sent to the DME? Please call the patient to let him know how he needs to proceed.

## 2023-12-24 ENCOUNTER — Ambulatory Visit: Admitting: Neurology

## 2023-12-24 DIAGNOSIS — G4739 Other sleep apnea: Secondary | ICD-10-CM

## 2023-12-24 DIAGNOSIS — G4733 Obstructive sleep apnea (adult) (pediatric): Secondary | ICD-10-CM

## 2023-12-25 NOTE — Progress Notes (Signed)
 [facility]  Francis Jackson 58 year old male [birth year]   HOME SLEEP TEST REPORT ( by [facility] PAT)   STUDY DATE:  12-24-2023   ORDERING CLINICIAN:  Neomia Banner, MD  REFERRING CLINICIAN:  Althia Jetty, Md ,.PhD  and Terrilyn Fick, NP    CLINICAL INFORMATION/HISTORY:   Francis Jackson  has a hx of seizures,  DVT, UTI , lumbar radiculopathy and OSA Fifths seizure in May 2014, again happened when he was not compliant with his Keppra . Proceeding by rising sensation, nauseous,  He also complains of symptoms consistent with obstructive sleep apnea, had sleep study in January 2011, at [facility] long, by Dr. Laney Piper, there was evidence of mild obstructive sleep apnea, lowest SpO2 was 82%, he was given advise of losing weight, sleep on his side, he continued to have frequent snoring, wife has to shake him to stop snoring, start breathing almost every night   UPDATE December 19 2016: [name] clinical visit was on December 20 2015, as the seizure was in July 10 2014 after he misses his Keppra  dose, is now taking Keppra  500 mg twice a day. He has been compliant with his CPAP machine, which help him sleep better. Set at 8 cm water  pressure,AHI was  3.9/h .     Epworth sleepiness score endorsed at : 2  /24.  FSS at X/ 63 points    BMI: 28  kg/m  Neck Circumference: X    FINDINGS:   Sleep Summary: Applying CMS criteria   Total Recording Time (hours, min):   6 hours 2 minutes Total Sleep Time (hours, min): 5 hours 7 minutes                Percent REM (%):    13.8%     Sleep latency was 15 minutes and REM sleep latency 42 minutes long.                                 Respiratory Indices:   Calculated pAHI (per CMS guideline):.  45/h  , with 35% of central sleep apneas.                     REM pAHI:   78.2/h                                              NREM pAHI: 40/h                            Positional AHI:   The majority of the night was recorded in supine sleep with an AHI of 47.2/h  followed by right lateral sleep with an AHI of only 6.6/h.  Snoring which the mean volume of 42 dB and was present for fifth of the total recorded sleep time. Snoring:                                                Oxygen Saturation Statistics:   Oxygen Saturation (%) Mean:    93%  O2 Saturation Range (%): Between the nadir at 76% with a maximum saturation of 100%                                     O2 Saturation (minutes) <89%: 23.3 minutes O2 saturation in minutes<90%: 28.8 minutes         Pulse Rate Statistics:   Pulse Mean (bpm):   59 bpm              Pulse Range:   Between the lowest heart rate at 46 bpm with a maximum heart rate of 84 bpm.              IMPRESSION:  This HST confirms the presence of severe complex sleep apnea with more than one third of the total apneas being central in origin. There was significant hypoxia noted the nadir was 76%.  The overall AHI was high at 45/h and REM sleep accentuated this apnea. There was intermittently bradycardia seen.     RECOMMENDATION: I agree with an continuation of CPAP care this patient will have a autotitration CPAP device between 5 and 12 cm water  pressure with 2 cm EPR, heated humidification and interface of his choice and comfort.    He will follow-up with the nurse practitioner after 60 and before 90 days of use of the new device .    NTERPRETING PHYSICIAN:   Neomia Banner, MD  [facility] and [facility] Sleep Board certified by The [employer] of Sleep Medicine and Diplomate of the [employer] of Sleep Medicine. Board certified In Neurology through the ABPN, Fellow of the [employer] of Neurology.

## 2024-01-07 ENCOUNTER — Telehealth: Payer: Self-pay

## 2024-01-07 DIAGNOSIS — G4739 Other sleep apnea: Secondary | ICD-10-CM | POA: Insufficient documentation

## 2024-01-07 NOTE — Telephone Encounter (Signed)
 Pt came into the office asking about the results from his sleep study. Results are in chart but need to be read by provider. Pt aware, let him know I would send a tele note to Dr. Jetta Jackson team.

## 2024-01-07 NOTE — Procedures (Signed)
 [facility]  Terell Stiteler Francis Jackson 58 year old male July 18, 1966   HOME SLEEP TEST REPORT ( by Watch PAT)   STUDY DATE:  12-24-2023   ORDERING CLINICIAN:  Neomia Banner, MD  REFERRING CLINICIAN:  Althia Jetty, Md ,.PhD  and Terrilyn Fick, NP    CLINICAL INFORMATION/HISTORY:   Francis Jackson  has a hx of seizures,  DVT, UTI , lumbar radiculopathy and OSA Fifths seizure in May 2014, again happened when he was not compliant with his Keppra . Proceeding by rising sensation, nauseous,  He also complains of symptoms consistent with obstructive sleep apnea, had sleep study in January 2011, at [facility], by Dr. Laney Piper, there was evidence of mild obstructive sleep apnea, lowest SpO2 was 82%, he was given advise of losing weight, sleep on his side, he continued to have frequent snoring, wife has to shake him to stop snoring, start breathing almost every night   UPDATE December 19 2016: [name] clinical visit was on December 20 2015, as the seizure was in July 10 2014 after he misses his Keppra  dose, is now taking Keppra  500 mg twice a day. He has been compliant with his CPAP machine, which help him sleep better. Set at 8 cm water  pressure,AHI was  3.9/h .     Epworth sleepiness score endorsed at : 2  /24.  FSS at X/ 63 points    BMI: 28  kg/m  Neck Circumference: X    FINDINGS:   Sleep Summary: Applying CMS criteria   Total Recording Time (hours, min):   6 hours 2 minutes Total Sleep Time (hours, min): 5 hours 7 minutes                Percent REM (%):    13.8%     Sleep latency was 15 minutes and REM sleep latency 42 minutes long.                                 Respiratory Indices:   Calculated pAHI (per CMS guideline):.  45/h  , with 35% of central sleep apneas.                     REM pAHI:   78.2/h                                              NREM pAHI: 40/h                            Positional AHI:   The majority of the night was recorded in supine sleep with an AHI of 47.2/h  followed by right lateral sleep with an AHI of only 6.6/h.  Snoring which the mean volume of 42 dB and was present for fifth of the total recorded sleep time. Snoring:                                                Oxygen Saturation Statistics:   Oxygen Saturation (%) Mean:    93%  O2 Saturation Range (%): Between the nadir at 76% with a maximum saturation of 100%                                     O2 Saturation (minutes) <89%: 23.3 minutes O2 saturation in minutes<90%: 28.8 minutes         Pulse Rate Statistics:   Pulse Mean (bpm):   59 bpm              Pulse Range:   Between the lowest heart rate at 46 bpm with a maximum heart rate of 84 bpm.              IMPRESSION:  This HST confirms the presence of severe complex sleep apnea with more than one third of the total apneas being central in origin. There was significant hypoxia noted the nadir was 76%.  The overall AHI was high at 45/h and REM sleep accentuated this apnea. There was intermittently bradycardia seen.     RECOMMENDATION: I agree with an continuation of CPAP care this patient will have a autotitration CPAP device between 5 and 12 cm water  pressure with 2 cm EPR, heated humidification and interface of his choice and comfort.    He will follow-up with the nurse practitioner after 60 and before 90 days of use of the new device .    NTERPRETING PHYSICIAN:   Neomia Banner, MD  [facility] Sleep Board certified by The [employer] of Sleep Medicine and Diplomate of the [employer] of Sleep Medicine. Board certified In Neurology through the ABPN, Fellow of the [employer] of Neu[employer].

## 2024-01-08 NOTE — Telephone Encounter (Addendum)
 Called patient to discuss sleep study results. No answer at this time. LVM for the patient to call back.      **IMPRESSION:  This HST confirms the presence of severe complex sleep apnea with more than one third of the total apneas being central in origin. There was significant hypoxia noted the nadir was 76%.  The overall AHI was high at 45/h and REM sleep accentuated this apnea. There was intermittently bradycardia seen.      RECOMMENDATION: I agree with an continuation of CPAP care this patient will have a autotitration CPAP device between 5 and 12 cm water  pressure with 2 cm EPR, heated humidification and interface of his choice and comfort.     He will follow-up with the nurse practitioner after 60 and before 90 days of use of the new device .

## 2024-01-13 NOTE — Telephone Encounter (Signed)
 Patient left a voicemail on our phone for a call back about his results.

## 2024-01-13 NOTE — Telephone Encounter (Signed)
 Called the patient and there was answer. LVM asking for the patient to call back.

## 2024-01-20 NOTE — Telephone Encounter (Signed)
 Pt came into the front today and advised he had been playing phone tag. He spoke with Moira Andrews at the front desk. Advised we can send order to [facility] if he is agreeable and pt stated he was. Order will be sent and the front staff will schedule so that his apt falls in the 31-90 day time frame for the new machine.

## 2024-01-20 NOTE — Telephone Encounter (Signed)
 Francis Jackson came in to get a prescription for a new machine. He missed your call 5/5 so you can call back I told him he has to answer so he can get what he needs.

## 2024-01-23 NOTE — Telephone Encounter (Signed)
 Pt came in office to front desk. States he called [facility] and they told him they did not have an order for his machine. I let pt know if he called Monday 5/12 the day the order was sent, they may not have had it yet. I called [facility] for the pt and spoke to customer service, they stated they did have the order for the pt and it was in process.

## 2024-03-11 ENCOUNTER — Ambulatory Visit (INDEPENDENT_AMBULATORY_CARE_PROVIDER_SITE_OTHER): Admitting: Orthopedic Surgery

## 2024-03-11 ENCOUNTER — Other Ambulatory Visit (INDEPENDENT_AMBULATORY_CARE_PROVIDER_SITE_OTHER): Payer: Self-pay

## 2024-03-11 DIAGNOSIS — M545 Low back pain, unspecified: Secondary | ICD-10-CM | POA: Diagnosis not present

## 2024-03-11 NOTE — Progress Notes (Signed)
 Orthopedic Spine Surgery Office Note   Assessment: Patient is a 58 y.o. male with chronic, stable low back pain that radiates into his bilateral thighs consistent with neurogenic claudication. On MRI, has lumbar stenosis from L3-L5.      Plan: -The majority of his pain his pain is in his low back.  I told him that surgery is not predictable at relieving low back pain.  He is inconsistent in his explanation of the symptoms.  At times it sounds like neurogenic claudication where he has pain in his back and legs with standing and walking that improves with sitting.  However, other times during this visit it sounded more like axial back pain with periodic leg pain.  After our conversation about the unpredictable nature of back pain after lumbar spine surgery, he wanted to proceed with an injection since they have helped in the past.  Referral provided to him today.  He should continue with pain management as well. -If we ever decide on surgery, would want an new MRI to ensure that there are not any further areas with stenosis that have developed since his last MRI in 2022 -Patient should return to office on an as needed basis     Patient expressed understanding of the plan and all questions were answered to the patient's satisfaction.    ___________________________________________________________________________     History:   Patient is a 58 y.o. male who presents today for follow up on his lumbar spine.  Patient continues to have low back pain and pain radiating into his anterolateral thighs.  His pain does not radiate past the knees.  He says that the pain has gotten worse lately.  He has been going to [facility] and has been taking medications which are helpful but pain always returns once the medication wears off.  He is interested in another treatment to try, either injection or surgery.     Treatments tried: Tramadol , steroid injections, activity modification, Tylenol      Physical  Exam:   General: no acute distress, appears stated age Neurologic: alert, answering questions appropriately, following commands Respiratory: unlabored breathing on room air, symmetric chest rise Psychiatric: appropriate affect, normal cadence to speech     MSK (spine):   -Strength exam                                                   Left                  Right EHL                              5/5                  5/5 TA                                 5/5                  5/5 GSC                             5/5  5/5 Knee extension            5/5                  5/5 Hip flexion                    5/5                  5/5   -Sensory exam                           Sensation intact to light touch in L3-S1 nerve distributions of bilateral lower extremities   Imaging: XRs of the lumbar spine from 03/11/2024 were independently reviewed and interpreted, showing disc height loss at L3/4, L4/5, and L5/S1. Small anterior osteophyte formation seen at L3/4 and L4/5. No evidence of instability on flexion/extension views. No fracture or dislocation seen.   MRI of the lumbar spine from 02/18/2021 was previously independently reviewed and interpreted, showing central and lateral recess stenosis at L2/3, L3/4, L4/5.  Lateral recess stenosis at L5/S1. Foraminal stenosis L3/4, L4/5, L5/S1 bilaterally.      Patient name: Francis Jackson Patient MRN: [id number] Date of visit: 03/11/24

## 2024-04-06 ENCOUNTER — Other Ambulatory Visit: Payer: Self-pay

## 2024-04-06 ENCOUNTER — Ambulatory Visit: Admitting: Physical Medicine and Rehabilitation

## 2024-04-06 DIAGNOSIS — M5416 Radiculopathy, lumbar region: Secondary | ICD-10-CM | POA: Diagnosis not present

## 2024-04-06 MED ORDER — METHYLPREDNISOLONE ACETATE 40 MG/ML IJ SUSP
40.0000 mg | Freq: Once | INTRAMUSCULAR | Status: AC
  Administered 2024-04-06: 40 mg

## 2024-04-06 NOTE — Progress Notes (Unsigned)
 Pain Scale   Average Pain 9    Lower back injection  124/75  +Driver, -BT, -Dye Allergies.

## 2024-04-07 NOTE — Procedures (Signed)
 Lumbosacral Transforaminal Epidural Steroid Injection - Sub-Pedicular Approach with Fluoroscopic Guidance  Patient: Francis Jackson      Date of Birth: June 17, 1966 MRN: [id number] PCP: Shelda Atlas, MD      Visit Date: 04/06/2024   Universal Protocol:    Date/Time: 04/06/2024  Consent Given By: the patient  Position: PRONE  Additional Comments: Vital signs were monitored before and after the procedure. Patient was prepped and draped in the usual sterile fashion. The correct patient, procedure, and site was verified.   Injection Procedure Details:   Procedure diagnoses: Radiculopathy, lumbar region [M54.16]    Meds Administered:  Meds ordered this encounter  Medications   methylPREDNISolone  acetate (DEPO-MEDROL ) injection 40 mg    Laterality: Bilateral  Location/Site: L4  Needle:5.0 in., 22 ga.  Short bevel or Quincke spinal needle  Needle Placement: Transforaminal  Findings:    -Comments: Excellent flow of contrast along the nerve, nerve root and into the epidural space.  Procedure Details: After squaring off the end-plates to get a true AP view, the C-arm was positioned so that an oblique view of the foramen as noted above was visualized. The target area is just inferior to the nose of the scotty dog or sub pedicular. The soft tissues overlying this structure were infiltrated with 2-3 ml. of 1% Lidocaine  without Epinephrine.  The spinal needle was inserted toward the target using a trajectory view along the fluoroscope beam.  Under AP and lateral visualization, the needle was advanced so it did not puncture dura and was located close the 6 O'Clock position of the pedical in AP tracterory. Biplanar projections were used to confirm position. Aspiration was confirmed to be negative for CSF and/or blood. A 1-2 ml. volume of Isovue -250 was injected and flow of contrast was noted at each level. Radiographs were obtained for documentation purposes.   After attaining the  desired flow of contrast documented above, a 0.5 to 1.0 ml test dose of 0.25% Marcaine  was injected into each respective transforaminal space.  The patient was observed for 90 seconds post injection.  After no sensory deficits were reported, and normal lower extremity motor function was noted,   the above injectate was administered so that equal amounts of the injectate were placed at each foramen (level) into the transforaminal epidural space.   Additional Comments:  The patient tolerated the procedure well Dressing: 2 x 2 sterile gauze and Band-Aid    Post-procedure details: Patient was observed during the procedure. Post-procedure instructions were reviewed.  Patient left the clinic in stable condition.

## 2024-04-07 NOTE — Progress Notes (Signed)
 Francis LANZO - 58 y.o. male MRN [id number]  Date of birth: 08-25-1966  Office Visit Note: Visit Date: 04/06/2024 PCP: Shelda Atlas, MD Referred by: Georgina Ozell LABOR, MD  Subjective: Chief Complaint  Patient presents with   Lower Back - Pain   HPI:  JUERGEN Jackson is a 58 y.o. male who comes in today at the request of Dr. Ozell Georgina for planned Bilateral L4-5 Lumbar Transforaminal epidural steroid injection with fluoroscopic guidance.  The patient has failed conservative care including home exercise, medications, time and activity modification.  This injection will be diagnostic and hopefully therapeutic.  Please see requesting physician notes for further details and justification.   ROS Otherwise per HPI.  Assessment & Plan: Visit Diagnoses:    ICD-10-CM   1. Radiculopathy, lumbar region  M54.16 XR C-ARM NO REPORT    Epidural Steroid injection    methylPREDNISolone  acetate (DEPO-MEDROL ) injection 40 mg      Plan: No additional findings.   Meds & Orders:  Meds ordered this encounter  Medications   methylPREDNISolone  acetate (DEPO-MEDROL ) injection 40 mg    Orders Placed This Encounter  Procedures   XR C-ARM NO REPORT   Epidural Steroid injection    Follow-up: Return for visit to requesting provider as needed.   Procedures: No procedures performed  Lumbosacral Transforaminal Epidural Steroid Injection - Sub-Pedicular Approach with Fluoroscopic Guidance  Patient: Francis Jackson      Date of Birth: Feb 23, 1966 MRN: [id number] PCP: Shelda Atlas, MD      Visit Date: 04/06/2024   Universal Protocol:    Date/Time: 04/06/2024  Consent Given By: the patient  Position: PRONE  Additional Comments: Vital signs were monitored before and after the procedure. Patient was prepped and draped in the usual sterile fashion. The correct patient, procedure, and site was verified.   Injection Procedure Details:   Procedure diagnoses: Radiculopathy, lumbar region [M54.16]     Meds Administered:  Meds ordered this encounter  Medications   methylPREDNISolone  acetate (DEPO-MEDROL ) injection 40 mg    Laterality: Bilateral  Location/Site: L4  Needle:5.0 in., 22 ga.  Short bevel or Quincke spinal needle  Needle Placement: Transforaminal  Findings:    -Comments: Excellent flow of contrast along the nerve, nerve root and into the epidural space.  Procedure Details: After squaring off the end-plates to get a true AP view, the C-arm was positioned so that an oblique view of the foramen as noted above was visualized. The target area is just inferior to the nose of the scotty dog or sub pedicular. The soft tissues overlying this structure were infiltrated with 2-3 ml. of 1% Lidocaine  without Epinephrine.  The spinal needle was inserted toward the target using a trajectory view along the fluoroscope beam.  Under AP and lateral visualization, the needle was advanced so it did not puncture dura and was located close the 6 O'Clock position of the pedical in AP tracterory. Biplanar projections were used to confirm position. Aspiration was confirmed to be negative for CSF and/or blood. A 1-2 ml. volume of Isovue -250 was injected and flow of contrast was noted at each level. Radiographs were obtained for documentation purposes.   After attaining the desired flow of contrast documented above, a 0.5 to 1.0 ml test dose of 0.25% Marcaine  was injected into each respective transforaminal space.  The patient was observed for 90 seconds post injection.  After no sensory deficits were reported, and normal lower extremity motor function was noted,   the above injectate  was administered so that equal amounts of the injectate were placed at each foramen (level) into the transforaminal epidural space.   Additional Comments:  The patient tolerated the procedure well Dressing: 2 x 2 sterile gauze and Band-Aid    Post-procedure details: Patient was observed during the  procedure. Post-procedure instructions were reviewed.  Patient left the clinic in stable condition.    Clinical History: No specialty comments available.     Objective:  VS:  HT:    WT:   BMI:     BP:   HR: bpm  TEMP: ( )  RESP:  Physical Exam Vitals and nursing note reviewed.  Constitutional:      General: He is not in acute distress.    Appearance: Normal appearance. He is not ill-appearing.  HENT:     Head: Normocephalic and atraumatic.     Right Ear: External ear normal.     Left Ear: External ear normal.     Nose: No congestion.  Eyes:     Extraocular Movements: Extraocular movements intact.  Cardiovascular:     Rate and Rhythm: Normal rate.     Pulses: Normal pulses.  Pulmonary:     Effort: Pulmonary effort is normal. No respiratory distress.  Abdominal:     General: There is no distension.     Palpations: Abdomen is soft.  Musculoskeletal:        General: No tenderness or signs of injury.     Cervical back: Neck supple.     Right lower leg: No edema.     Left lower leg: No edema.     Comments: Patient has good distal strength without clonus.  Skin:    Findings: No erythema or rash.  Neurological:     General: No focal deficit present.     Mental Status: He is alert and oriented to person, place, and time.     Sensory: No sensory deficit.     Motor: No weakness or abnormal muscle tone.     Coordination: Coordination normal.  Psychiatric:        Mood and Affect: Mood normal.        Behavior: Behavior normal.      Imaging: No results found.

## 2024-04-20 ENCOUNTER — Telehealth: Payer: Self-pay | Admitting: Family Medicine

## 2024-04-20 NOTE — Progress Notes (Signed)
 SABRA

## 2024-04-20 NOTE — Telephone Encounter (Signed)
 Appointment details confirmed

## 2024-04-21 ENCOUNTER — Ambulatory Visit: Admitting: Family Medicine

## 2024-04-21 ENCOUNTER — Encounter: Payer: Self-pay | Admitting: Family Medicine

## 2024-04-21 VITALS — BP 115/79 | HR 54 | Ht 68.0 in | Wt 191.6 lb

## 2024-04-21 DIAGNOSIS — R202 Paresthesia of skin: Secondary | ICD-10-CM

## 2024-04-21 DIAGNOSIS — G4733 Obstructive sleep apnea (adult) (pediatric): Secondary | ICD-10-CM

## 2024-04-21 DIAGNOSIS — R569 Unspecified convulsions: Secondary | ICD-10-CM

## 2024-04-21 MED ORDER — LEVETIRACETAM ER 500 MG PO TB24: 2000.0000 mg | ORAL_TABLET | Freq: Every day | ORAL | 3 refills | Status: AC

## 2024-04-21 NOTE — Patient Instructions (Signed)
 Below is our plan:  We will continue levetiracetam  XR 2000mg  daily.   Please make sure you are consistent with timing of seizure medication. I recommend annual visit with primary care provider (PCP) for complete physical and routine blood work. I recommend daily intake of vitamin D  (400-800iu) and calcium (800-1000mg ) for bone health. Discuss Dexa screening with PCP.   According to [place] law, you can not drive unless you are seizure / syncope free for at least 6 months and under physician's care.  Please maintain precautions. Do not participate in activities where a loss of awareness could harm you or someone else. No swimming alone, no tub bathing, no hot tubs, no driving, no operating motorized vehicles (cars, ATVs, motocycles, etc), lawnmowers, power tools or firearms. No standing at heights, such as rooftops, ladders or stairs. Avoid hot objects such as stoves, heaters, open fires. Wear a helmet when riding a bicycle, scooter, skateboard, etc. and avoid areas of traffic. Set your water  heater to 120 degrees or less.  SUDEP is the sudden, unexpected death of someone with epilepsy, who was otherwise healthy. In SUDEP cases, no other cause of death is found when an autopsy is done. Each year, more than 1 in 1,000 people with epilepsy die from SUDEP. This is the leading cause of death in people with uncontrolled seizures. Until further answers are available, the best way to prevent SUDEP is to lower your risk by controlling seizures. Research has found that people with all types of epilepsy that experience convulsive seizures can be at risk.  Please continue using your CPAP regularly. While your insurance requires that you use CPAP at least 4 hours each night on 70% of the nights, I recommend, that you not skip any nights and use it throughout the night if you can. Getting used to CPAP and staying with the treatment long term does take time and patience and discipline. Untreated obstructive sleep apnea when  it is moderate to severe can have an adverse impact on cardiovascular health and raise her risk for heart disease, arrhythmias, hypertension, congestive heart failure, stroke and diabetes. Untreated obstructive sleep apnea causes sleep disruption, nonrestorative sleep, and sleep deprivation. This can have an impact on your day to day functioning and cause daytime sleepiness and impairment of cognitive function, memory loss, mood disturbance, and problems focussing. Using CPAP regularly can improve these symptoms.  Please make sure you are staying well hydrated. I recommend 50-60 ounces daily. Well balanced diet and regular exercise encouraged. Consistent sleep schedule with 6-8 hours recommended.   Please continue follow up with care team as directed.   Follow up with me in 1 year   You may receive a survey regarding today's visit. I encourage you to leave honest feed back as I do use this information to improve patient care. Thank you for seeing me today!

## 2024-04-21 NOTE — Progress Notes (Signed)
 PATIENT: Francis Jackson DOB: [birth year]  REASON FOR VISIT: follow up HISTORY FROM: patient  Chief Complaint  Patient presents with   Follow-up    Pt in room 2. Alone. Here for cpap folllow up. Patient has new machine states going well so far. Pt said pressure was too high, has been changed and works better.     HISTORY: (copied from my note on 01/12/2020)   HISTORY Francis Jackson is Francis 58 years old right-handed [place] American male, native of [place], return to clinic for followup seizure, last clinical visit was May 2013 with Elveria   He began to have seizure since August 05 2009, it was nocturnal seizure, generalized tonic-clonic lasting about 10-15 minutes.   Evaluation showed normal MRI of the brain, EEG, laboratory showed normal CBC, CMP, UA, negative UDS.   Second nocturnal seizure July 2011, third seizure in February 2012, he was visiting his friend with his wife, he began to circling around, confused, and then went to tonic-clonic , he was started on Keppra  500 mg twice a day in 2012,   4 seizure was in June 2013, while he was not compliant with his Keppra , he was taken to emergency room, had multiple abrasions  to his face, and right eye, CAT scan of the brain and neck showed no significant abnormality.   Fifths seizure in May 2014, again happened when he was not compliant with his Keppra . Proceeding by rising sensation, nauseous,   He also complains of symptoms consistent with obstructive sleep apnea, had sleep study in January 2011, at [facility] long, by Dr. Cecilia, there was evidence of mild obstructive sleep apnea, lowest SpO2 was 82%, he was given advise of losing weight, sleep on his side, he continued to have frequent snoring, wife has to shake him to stop snoring, start breathing almost every night   UPDATE December 19 2016: [name] clinical visit was on December 20 2015, as the seizure was in July 10 2014 after he misses his Keppra  dose, is now taking Keppra  500 mg twice a  day.   He has been compliant with his CPAP machine, which help him sleep better   UPDATE April 11th,  2019[name] 58 year old male returns for follow-up, with history of seizure disorder.  Last seizure occurred in 2015.  He is currently on Keppra  XR 500 mg 2 tablets at bedtime.  He denies missing any doses.  CPAP compliance dated 11/17/2017- 12/16/2017 shows compliance greater than 4 hours for 60% for 18 days.  Average usage 4 hours 52 minutes.  Set pressure 7 cm.  EPR level 1.  AHI 7.7 ESS 5.  Patient reports he does not feel he is getting enough pressure from his CPAP.  FSS 16.  He returns for reevaluation   Today 04/14/18 Francis Jackson is a 58 year old male with a history of obstructive sleep apnea on CPAP as well as seizures.  His CPAP download indicates that he uses machine 25 out of 90 days for compliance of 83%.  He uses machine greater than 4 hours 51 out of 90 days for compliance of 57%.  On average he uses his machine 5 hours.  His residual AHI is 3.7 on 8 cm of water  with EPR of 1.  The patient states that sometimes he does not go to bed till after 1 AM.  Patient repots that he does not use the machine during the day when he naps.  He remains on Keppra .  Denies any seizure events.  He returns today for evaluation.   UPDATE 02/16/19 Francis Jackson is a 58 y.o. male here today for follow up of OSA on CPAP.  He is doing very well with CPAP therapy.  Download report dated 01/13/2019 through 02/11/2019 reveals that he is used his CPAP 29 out of the last 30 days for compliance of 97%.  29 of the days he used his machine for greater than 4 hours for compliance of 97%.  Average usage was 6 hours and 22 minutes.  AHI was 4.7 on 8 cm of water  with an EPR of 1.  There is no significant leak.  He states that his machine is older and is requesting a replacement CPAP.  He has reached out to his DME provider who asked for updated follow-up.  Otherwise he is doing very well and notes significant benefit with CPAP  therapy.  He also continues with Keppra  1000 mg every night.  He is tolerating medication well.  He denies any seizure activity.   UPDATE 01/12/2020 Francis Francis Jackson is a 58 y.o. male here today for follow up of OSA on CPAP and seizure. He admits that he has gotten out of the habit of using CPAP. He denies any concerns with therapy. He wishes to resume daily therapy.   Compliance report dated 12/12/2019 through 01/10/2020 reveals that he has used CPAP therapy 9 of the past 30 days for compliance of 30%.  He did use CPAP greater than 4 hours those 9 days for compliance of 30%.  Average use on days used was 5 hours and 13 minutes.  Residual AHI was 4.0 on 8 cm of water .  There was no leak noted.  Review of 90-day compliance report dated 10/13/2019 through 01/10/2020 reveals daily compliance of 60% and 4-hour compliance of 52%.  He continues levetiracetam  XR 1,000mg  daily. No recent seizure activity. He is feeling well and without concerns today.   UPDATE 07/19/2020 Francis:  Francis Jackson is a 58 y.o. male here today for follow up for OSA on CPAP and seizures. He continues levetiracetam  XR 1000mg  for seizure management and gabapentin  300mg  daily as needed for neck pain. No seizure activity since 2015.   Compliance report dated 06/18/2020-07/17/2020 reveals a use CPAP 27 of the past 30 days for compliance of 90%.  The CPAP greater than 4 hours 14 of the past 30 days for compliance of 47%.  Average usage on days used was 4 hours and 18 minutes.  Residual AHI was 4.5 on a set pressure of 8 cm of water .  There was no significant leak noted. 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 He does feel sleepy during the daytime but feels CPAP therapy has helped significantly.   UPDATE 01/17/2022 Francis: Francis Jackson is a 58 y.o. male here today for follow up for OSA on CPAP and seizures. He was last seen by Dr Onita 07/20/2021 and reported a breakthrough seizure. He was advised to increase  levetiracetam  XR to 1500mg  for seizure management. No longer taking gabapentin  for neck pain. No seizure activity since. He has resumed driving. He works for [employer]. He typically works from 4-10am then again from 4p-8p.   He continues CPAP therapy nightly for about 4-5 hours. He has continued to note benefit with CPAP. He has less daytime sleepiness. He denies sleepiness with driving.    UPDATE Oct 16 2022 YY: He reported recurrent seizure in November  2023, happened in his sleep, 2 to 3 AM, witnessed by his wife, wake up with tongue biting, this happened while he was taking Keppra  xr 500 3 tablets every night, he tolerated the medication well, no significant side effect noted  UPDATE 07/04/2023 Francis:  Francis Jackson returns for follow up for or OSA on CPAP and seizures. He was last seen by Dr Onita 10/2022 for breakthrough seizure occurring during sleep 07/2023. EEG showed sharp transient involving right hemisphere, centered at T8, spreading to adjacent leads, F8,  F4, C4, P4, suggesting focal irritability. [place] was increased to 2000mg  daily. Since, he reports doing well on lev XR 2000mg  daily. No adverse effects. No seizures. He is back to driving. He works for [employer]. He is getting more sleep. Usually sleeps from 10p-4-5a then works from [employer]. He takes a nap in the afternoons and goes back to work around 5p-9p. He does not drink a lot of water . He is tolerating his CPAP. Current machine set up 02/2019.     UPDATE 12/10/2023 Francis: Francis Jackson returns for follow up for OSA on CPAP and seizures. He continues lev 2000mg  daily. Tolerating it well with no seizures.   He was previously doing well on CPAP therapy but recently concerned that it is not operating correctly. He is scared to use his machine as he feels the water  is boiling. DME checked machine and told him he needs a new on as his was set up in 2010. We ordered a new machine for him 02/2019.  UPDATE 04/21/2024 Francis: Francis Jackson returns for initial CPAP compliance visit. HST  showed severe complex sleep apnea with more than one third of the total apneas being central in origin. There was significant hypoxia noted the nadir was 76%.  The overall AHI was high at 45/h and REM sleep accentuated this apnea. He is adjusting to new machine. He does note improvement in sleep quality when using therapy. He continues to work for [employer] and sleep schedules are variable. He continues lev XR 2000mg  daily at bedtime. No seizures.      REVIEW OF SYSTEMS: Out of a complete 14 system review of symptoms, the patient complains only of the following symptoms, none and Francis other reviewed systems are negative.  ESS: 0/24  ALLERGIES: No Known Allergies  HOME MEDICATIONS: Outpatient Medications Prior to Visit  Medication Sig Dispense Refill   amLODipine (NORVASC) 5 MG tablet Take 5 mg by mouth daily.     HYDROcodone -acetaminophen  (NORCO/VICODIN) 5-325 MG tablet Take 1 tablet by mouth daily as needed.     metoprolol succinate (TOPROL-XL) 50 MG 24 hr tablet Take 50 mg by mouth daily.  5   tamsulosin  (FLOMAX ) 0.4 MG CAPS capsule Take 0.4 mg by mouth daily.     levETIRAcetam  (KEPPRA  XR) 500 MG 24 hr tablet Take 4 tablets (2,000 mg total) by mouth at bedtime. 360 tablet 3   ELIQUIS  5 MG TABS tablet Take 5 mg by mouth 2 (two) times daily. (Patient not taking: Reported on 12/10/2023)     famotidine (PEPCID) 20 MG tablet Take 20 mg by mouth 2 (two) times daily. (Patient not taking: Reported on 12/10/2023)     finasteride (PROSCAR) 5 MG tablet Take 5 mg by mouth daily. (Patient not taking: Reported on 04/21/2024)     LINZESS 72 MCG capsule Take 72 mcg by mouth daily. (Patient not taking: Reported on 12/10/2023)     meloxicam (MOBIC) 15 MG tablet Take 15 mg by mouth daily. (Patient not taking: Reported  on 12/10/2023)     Vitamin D , Ergocalciferol , (DRISDOL) 1.25 MG (50000 UNIT) CAPS capsule Take 50,000 Units by mouth once a week. (Patient not taking: Reported on 12/10/2023)     No  facility-administered medications prior to visit.    PAST MEDICAL HISTORY: Past Medical History:  Diagnosis Date   Acute urinary retention 12/22/2011   BPH (benign prostatic hyperplasia) 08/24/2013   Gross hematuria 12/22/2011   History of kidney stones    History of urinary retention    12/2011   Migraine    Mild obstructive sleep apnea    PER STUDY 09-14-2009-uses CPAP   Seizure disorder, grand mal (HCC)    PER PT LAST SEIZURE 01/2013   Seizures (HCC)    Ureteral calculus 12/22/2011    PAST SURGICAL HISTORY: Past Surgical History:  Procedure Laterality Date   CYSTO/  RIGHT RETROGRADE PYELOGRAM/  URETEROSCOPY  12-23-2011   EXCISION LIPOMA OF SCALP AND FACE  03-24-2005   INGUINAL HERNIA REPAIR Bilateral 10/18/2016   Procedure: LAPAROSCOPIC EXPLORATION AND REPAIR OF BILATERAL INGUINAL HERNIA WITH A TAPP REPAIR;  Surgeon: Elspeth Schultze, MD;  Location: [facility];  Service: General;  Laterality: Bilateral;   INSERTION OF MESH Bilateral 10/18/2016   Procedure: INSERTION OF MESH;  Surgeon: Elspeth Schultze, MD;  Location: [facility];  Service: General;  Laterality: Bilateral;   LAPAROSCOPIC LYSIS OF ADHESIONS  10/18/2016   Procedure: LAPAROSCOPIC LYSIS OF ADHESIONS;  Surgeon: Elspeth Schultze, MD;  Location: [facility];  Service: General;;   SHOULDER SURGERY Left 2009   TRANSURETHRAL RESECTION OF PROSTATE N/A 08/24/2013   Procedure: TRANSURETHRAL RESECTION OF THE PROSTATE WITH GYRUS INSTRUMENTS;  Surgeon: Alm GORMAN Fragmin, MD;  Location: [facility];  Service: Urology;  Laterality: N/A;    FAMILY HISTORY: History reviewed. No pertinent family history.  SOCIAL HISTORY: Social History   Socioeconomic History   Marital status: Married    Spouse name: Not on file   Number of children: 4   Years of education: 12   Highest education level: Not on file  Occupational History   Occupation: Taxi driver  Tobacco Use   Smoking status: Never   Smokeless tobacco: Never  Vaping Use   Vaping status:  Never Used  Substance and Sexual Activity   Alcohol use: No    Alcohol/week: 0.0 standard drinks of alcohol   Drug use: No   Sexual activity: Not on file  Other Topics Concern   Not on file  Social History Narrative   Patient lives at home with his wife Levester Palau)   Patient works as a Media planner part time.   Right handed   Patient drinks one cup of tea per week.   Patient has four children.      Originally [place]   Social Drivers of Health   Financial Resource Strain: Not on file  Food Insecurity: Not on file  Transportation Needs: Not on file  Physical Activity: Not on file  Stress: Not on file  Social Connections: Not on file  Intimate Partner Violence: Not on file     PHYSICAL EXAM  Vitals:   04/21/24 1318  BP: 115/79  Pulse: (!) 54  SpO2: 96%  Weight: 191 lb 9.6 oz (86.9 kg)  Height: 5' 8 (1.727 m)      Body mass index is 29.13 kg/m.  Generalized: Well developed, in no acute distress  Cardiology: normal rate and rhythm, no murmur noted Respiratory: clear to auscultation bilaterally  Neurological  examination  Mentation: Alert oriented to time, place, history taking. Follows Francis commands speech and language fluent Cranial nerve II-XII: Pupils were equal round reactive to light. Extraocular movements were full, visual field were full  Motor: The motor testing reveals 5 over 5 strength of Francis 4 extremities. Good symmetric motor tone is noted throughout.  Gait and station: Gait is normal.    DIAGNOSTIC DATA (LABS, IMAGING, TESTING) - I reviewed patient records, labs, notes, testing and imaging myself where available.      No data to display           Lab Results  Component Value Date   WBC 10.9 (H) 12/07/2022   HGB 14.5 12/07/2022   HCT 44.9 12/07/2022   MCV 80.5 12/07/2022   PLT 239 12/07/2022      Component Value Date/Time   NA 132 (L) 12/07/2022 2022   NA 141 10/16/2022 1148   K 3.6 12/07/2022 2022   CL 102  12/07/2022 2022   CO2 23 12/07/2022 2022   GLUCOSE 176 (H) 12/07/2022 2022   BUN 17 12/07/2022 2022   BUN 11 10/16/2022 1148   CREATININE 1.12 12/07/2022 2022   CREATININE 1.12 05/29/2022 0000   CALCIUM 9.0 12/07/2022 2022   PROT 6.9 10/16/2022 1148   ALBUMIN 4.5 10/16/2022 1148   AST 24 10/16/2022 1148   ALT 23 10/16/2022 1148   ALKPHOS 75 10/16/2022 1148   BILITOT 0.4 10/16/2022 1148   GFRNONAA >60 12/07/2022 2022   GFRAA >60 10/09/2016 0947   Lab Results  Component Value Date   CHOL 180 05/29/2022   HDL 39 (L) 05/29/2022   LDLCALC 121 (H) 05/29/2022   TRIG 95 05/29/2022   CHOLHDL 4.6 05/29/2022   Lab Results  Component Value Date   HGBA1C 6.8 (H) 10/16/2022   Lab Results  Component Value Date   VITAMINB12 684 07/03/2022   Lab Results  Component Value Date   TSH 0.59 05/29/2022     ASSESSMENT AND PLAN 58 y.o. year old male  has a past medical history of Acute urinary retention (12/22/2011), BPH (benign prostatic hyperplasia) (08/24/2013), Gross hematuria (12/22/2011), History of kidney stones, History of urinary retention, Migraine, Mild obstructive sleep apnea, Seizure disorder, grand mal (HCC), Seizures (HCC), and Ureteral calculus (12/22/2011). here with     ICD-10-CM   1. OSA on CPAP  G47.33 levETIRAcetam  (KEPPRA  XR) 500 MG 24 hr tablet    2. Seizures (HCC)  R56.9 levETIRAcetam  (KEPPRA  XR) 500 MG 24 hr tablet    3. Paresthesia  R20.2 levETIRAcetam  (KEPPRA  XR) 500 MG 24 hr tablet      Francis Jackson is doing well on CPAP therapy. Previous compliance is excellent from daily standpoint and acceptable 4 hour usage. Average usage 5 hours. AHI well managed. He has not used CPAP for the past couple of weeks due to concerns of malfunctioning.  He was encouraged to continue using CPAP nightly and for greater than 4 hours each night. We will update supply orders as indicated. We will repeat HST and plan to order new machine pending results. Risks of untreated sleep apnea  review and education materials provided. He will continue levetiracetam  XR 2000mg  daily. Seizure precautions reviewed. Adequate hydration reviewed. Healthy lifestyle habits encouraged. He will follow up in 1 year. He verbalizes understanding and agreement with this plan.    No orders of the defined types were placed in this encounter.    Meds ordered this encounter  Medications   levETIRAcetam  (KEPPRA  XR)  500 MG 24 hr tablet    Sig: Take 4 tablets (2,000 mg total) by mouth at bedtime.    Dispense:  360 tablet    Refill:  3    Supervising Provider:   AHERN, ANTONIA B [id number]    I personally spent a total of 30 minutes in the care of the patient today including preparing to see the patient, getting/reviewing separately obtained history, performing a medically appropriate exam/evaluation, counseling and educating, placing orders, documenting clinical information in the EHR, independently interpreting results, and communicating results.   Greig Forbes, FNP-C 04/21/2024, 1:47 PM [facility] [address], [address] [place], [place] [address] [phone]

## 2024-06-24 ENCOUNTER — Institutional Professional Consult (permissible substitution) (INDEPENDENT_AMBULATORY_CARE_PROVIDER_SITE_OTHER)

## 2024-07-02 ENCOUNTER — Ambulatory Visit (INDEPENDENT_AMBULATORY_CARE_PROVIDER_SITE_OTHER)

## 2024-07-02 VITALS — BP 166/90 | HR 55 | Wt 163.0 lb

## 2024-07-02 DIAGNOSIS — R430 Anosmia: Secondary | ICD-10-CM

## 2024-07-02 NOTE — Progress Notes (Unsigned)
 HPI:   Francis Jackson is a 58 y.o. male who presents as a new patient being seen in consultation for anosmia.   5 years ago COVID shot 3 weeks No smell even if its bad or good No head trauma Breathing normal No pnd No rhinitis  No allergies No surgeries on the nose   PMH/Meds/All/SocHx/FamHx/ROS: Past Medical History:  Diagnosis Date   Acute urinary retention 12/22/2011   BPH (benign prostatic hyperplasia) 08/24/2013   Gross hematuria 12/22/2011   History of kidney stones    History of urinary retention    12/2011   Migraine    Mild obstructive sleep apnea    PER STUDY 09-14-2009-uses CPAP   Seizure disorder, grand mal (HCC)    PER PT LAST SEIZURE 01/2013   Seizures (HCC)    Ureteral calculus 12/22/2011   Past Surgical History:  Procedure Laterality Date   CYSTO/  RIGHT RETROGRADE PYELOGRAM/  URETEROSCOPY  12-23-2011   EXCISION LIPOMA OF SCALP AND FACE  03-24-2005   INGUINAL HERNIA REPAIR Bilateral 10/18/2016   Procedure: LAPAROSCOPIC EXPLORATION AND REPAIR OF BILATERAL INGUINAL HERNIA WITH A TAPP REPAIR;  Surgeon: Elspeth Schultze, MD;  Location: [facility];  Service: General;  Laterality: Bilateral;   INSERTION OF MESH Bilateral 10/18/2016   Procedure: INSERTION OF MESH;  Surgeon: Elspeth Schultze, MD;  Location: [facility];  Service: General;  Laterality: Bilateral;   LAPAROSCOPIC LYSIS OF ADHESIONS  10/18/2016   Procedure: LAPAROSCOPIC LYSIS OF ADHESIONS;  Surgeon: Elspeth Schultze, MD;  Location: [facility];  Service: General;;   SHOULDER SURGERY Left 2009   TRANSURETHRAL RESECTION OF PROSTATE N/A 08/24/2013   Procedure: TRANSURETHRAL RESECTION OF THE PROSTATE WITH GYRUS INSTRUMENTS;  Surgeon: Alm GORMAN Fragmin, MD;  Location[facility];  Service: Urology;  Laterality: N/A;   No family history of bleeding disorders, wound healing problems or difficulty with anesthesia.  Social Connections: Not on file    Current Outpatient Medications:    amLODipine (NORVASC) 5 MG tablet, Take 5 mg  by mouth daily., Disp: , Rfl:    HYDROcodone -acetaminophen  (NORCO/VICODIN) 5-325 MG tablet, Take 1 tablet by mouth daily as needed., Disp: , Rfl:    levETIRAcetam  (KEPPRA  XR) 500 MG 24 hr tablet, Take 4 tablets (2,000 mg total) by mouth at bedtime., Disp: 360 tablet, Rfl: 3   metoprolol succinate (TOPROL-XL) 50 MG 24 hr tablet, Take 50 mg by mouth daily., Disp: , Rfl: 5   tamsulosin  (FLOMAX ) 0.4 MG CAPS capsule, Take 0.4 mg by mouth daily., Disp: , Rfl:  A complete ROS was performed with pertinent positives/negatives noted in the HPI. The remainder of the ROS are negative.   Physical Exam:  BP (!) 166/90 (BP Location: Left Arm, Patient Position: Sitting, Cuff Size: Normal)   Pulse (!) 55   Wt 163 lb (73.9 kg)   BMI 24.78 kg/m  General: Well developed, well nourished. No acute distress. Voice *** Head/Face: Normocephalic. No sinus tenderness. Facial nerve intact and equal bilaterally. ***No facial lacerations. Eyes: PERRL, no scleral icterus or conjunctival hemorrhage. EOMI. Ears: No gross deformity. Normal external canal. Tympanic membrane *** bilaterally Hearing: Normal speech reception. *** Nose: No gross deformity or lesions. No purulent discharge. No turbinate hypertrophy. Mouth/Oropharynx: Lips without any lesions. Dentition ***. No mucosal lesions within the oropharynx. No tonsillar enlargement, exudate, or lesions. Pharyngeal walls symmetrical. Uvula midline. Tongue midline without lesions. Larynx: See TFL if applicable Nasopharynx: See TFL if applicable Neck: Trachea midline. No masses. No thyromegaly or nodules palpated.  No crepitus. Lymphatic: No lymphadenopathy in the neck. Respiratory: No stridor or distress. Room air. Cardiovascular: Regular rate and rhythm. Extremities: No edema or cyanosis. Warm and well-perfused. Skin: No scars or lesions on face or neck. Neurologic: CN II-XII grossly intact. Moving all extremities without gross abnormality. Other:  Independent Review of  Additional Tests or Records: None*** Procedures: None*** Impression & Plans: Francis Jackson is a 58 y.o. male with *** - ***  Smell retraining therapy  MRI

## 2024-07-07 ENCOUNTER — Telehealth: Payer: Self-pay | Admitting: Family Medicine

## 2024-07-07 NOTE — Telephone Encounter (Signed)
 Spoke with patient, he was okay with cancelling 07/09/24 appt

## 2024-07-07 NOTE — Telephone Encounter (Signed)
 Please contact patient and let him know that he was just seen 04/2024 and doing well. If he is still doing well, we do not need to see him 07/09/2024 and can offer to a wait list pt. TY!

## 2024-07-09 ENCOUNTER — Ambulatory Visit: Payer: Medicaid Other | Admitting: Family Medicine

## 2024-07-10 ENCOUNTER — Ambulatory Visit (HOSPITAL_COMMUNITY)
Admission: RE | Admit: 2024-07-10 | Discharge: 2024-07-10 | Disposition: A | Discharge status: Home / Self Care | Source: Ambulatory Visit

## 2024-07-10 DIAGNOSIS — Z8673 Personal history of transient ischemic attack (TIA), and cerebral infarction without residual deficits: Secondary | ICD-10-CM

## 2024-07-10 DIAGNOSIS — R430 Anosmia: Secondary | ICD-10-CM | POA: Diagnosis present

## 2024-07-10 MED ORDER — GADOBUTROL 1 MMOL/ML IV SOLN
7.4000 mL | Freq: Once | INTRAVENOUS | Status: AC | PRN
  Administered 2024-07-10: 7.4 mL via INTRAVENOUS

## 2024-07-14 ENCOUNTER — Ambulatory Visit: Admitting: Podiatry

## 2025-04-27 ENCOUNTER — Ambulatory Visit: Admitting: Family Medicine
